# Patient Record
Sex: Female | Born: 1970 | Race: White | Hispanic: No | Marital: Married | Smoking: Never smoker
Health system: Southern US, Community
[De-identification: ages and names within clinical notes are randomized; demographics above are authoritative.]

## PROBLEM LIST (undated history)

## (undated) DIAGNOSIS — N870 Mild cervical dysplasia: Secondary | ICD-10-CM

## (undated) DIAGNOSIS — K219 Gastro-esophageal reflux disease without esophagitis: Secondary | ICD-10-CM

## (undated) DIAGNOSIS — R51 Headache: Secondary | ICD-10-CM

## (undated) DIAGNOSIS — E559 Vitamin D deficiency, unspecified: Secondary | ICD-10-CM

## (undated) DIAGNOSIS — R519 Headache, unspecified: Secondary | ICD-10-CM

## (undated) DIAGNOSIS — T7840XA Allergy, unspecified, initial encounter: Secondary | ICD-10-CM

## (undated) DIAGNOSIS — J45909 Unspecified asthma, uncomplicated: Secondary | ICD-10-CM

## (undated) DIAGNOSIS — N1370 Vesicoureteral-reflux, unspecified: Secondary | ICD-10-CM

## (undated) DIAGNOSIS — J3089 Other allergic rhinitis: Secondary | ICD-10-CM

## (undated) DIAGNOSIS — A6009 Herpesviral infection of other urogenital tract: Secondary | ICD-10-CM

## (undated) HISTORY — PX: DILATION AND CURETTAGE OF UTERUS: SHX78

## (undated) HISTORY — PX: ESOPHAGOGASTRODUODENOSCOPY: SHX1529

## (undated) HISTORY — PX: SPINE SURGERY: SHX786

## (undated) HISTORY — DX: Vitamin D deficiency, unspecified: E55.9

## (undated) HISTORY — DX: Vesicoureteral-reflux, unspecified: N13.70

## (undated) HISTORY — DX: Allergy, unspecified, initial encounter: T78.40XA

## (undated) HISTORY — PX: EXTERNAL EAR SURGERY: SHX627

## (undated) HISTORY — PX: CERVICAL SPINE SURGERY: SHX589

## (undated) HISTORY — DX: Mild cervical dysplasia: N87.0

## (undated) HISTORY — PX: DILATATION & CURETTAGE/HYSTEROSCOPY WITH TRUECLEAR: SHX6353

## (undated) HISTORY — PX: MANDIBLE SURGERY: SHX707

---
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
## 1999-01-22 HISTORY — PX: BACK SURGERY: SHX140

## 1999-02-14 ENCOUNTER — Other Ambulatory Visit: Admission: RE | Admit: 1999-02-14 | Discharge: 1999-02-14 | Payer: Self-pay | Admitting: Internal Medicine

## 1999-04-23 ENCOUNTER — Ambulatory Visit (HOSPITAL_COMMUNITY): Admission: RE | Admit: 1999-04-23 | Discharge: 1999-04-24 | Payer: Self-pay | Admitting: *Deleted

## 1999-04-23 ENCOUNTER — Encounter: Payer: Self-pay | Admitting: *Deleted

## 1999-08-02 ENCOUNTER — Ambulatory Visit (HOSPITAL_COMMUNITY): Admission: RE | Admit: 1999-08-02 | Discharge: 1999-08-02 | Payer: Self-pay

## 1999-08-02 ENCOUNTER — Encounter: Payer: Self-pay | Admitting: *Deleted

## 2000-10-27 ENCOUNTER — Emergency Department (HOSPITAL_COMMUNITY): Admission: EM | Admit: 2000-10-27 | Discharge: 2000-10-28 | Payer: Self-pay | Admitting: Emergency Medicine

## 2000-10-28 ENCOUNTER — Encounter: Payer: Self-pay | Admitting: Emergency Medicine

## 2003-04-07 ENCOUNTER — Other Ambulatory Visit: Admission: RE | Admit: 2003-04-07 | Discharge: 2003-04-07 | Payer: Self-pay | Admitting: Family Medicine

## 2010-01-03 ENCOUNTER — Other Ambulatory Visit
Admission: RE | Admit: 2010-01-03 | Discharge: 2010-01-03 | Payer: Self-pay | Source: Home / Self Care | Admitting: Registered Nurse

## 2010-02-10 ENCOUNTER — Encounter: Payer: Self-pay | Admitting: Emergency Medicine

## 2010-06-08 NOTE — Op Note (Signed)
[place]  Patient:    Anna Torres, Anna Torres                         MRN: [id number] Proc. Date: 04/23/99 Adm. 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  OPERATIONS: 1. C4/5 anterior discectomy, microsurgical. 2. Fusion with 8 mm custom tailored allograft fibular strut and DBX bone paste. 3. Fixation with 24 mm Codman titanium plate and 12 mm by 3.5 mm screws x 4.  SURGEON:  Ricky D. Gasper Sells, M.D.  ASSISTANT:  Jearld Adjutant, M.D.  COMPLICATIONS:  Nil.  SPONGE/NEEDLE/INSTRUMENT COUNTS:  Correct.  INDICATION:  This is a 40 year old female that presented with marked weakness of the deltoid muscle on the left and the external rotators of the shoulder on the  left and diffuse shoulder weakness and pain.  In addition, she had intermittent  numbness and tingling of the hand and a droopy shoulder.  This represents an urgent situation since she loses innervation to her shoulder muscles.  She could develop shoulder-hand syndrome and basically an almost useless arm.  She was taken to the operating room where an uncomplicated approach was made to the obviously ruptured C4/5 disc.  This was confirmed by x-ray.  At the time of closure, the C5 nerve roots were cleared bilaterally.  The only problem with the operation is that it was difficult to get purchase in the upper screws that is n the C4 vertebral body screws.  Other than that the case is unremarkable.  DESCRIPTION OF PROCEDURE:  With the patient in the supine position with the neck slightly extended, the patient was prepped and draped in the usual fashion for n anterior approach to the cervical spine  on the right.  We investigated her neck  preincision to see where her skin fold lines were and they were more prominent n the right than the left, so that is why I approached from that side.  It also makes it slightly easier to see the left C5 nerve root.  All pressure points were carefully inspected and padded and then the patient was prepped and draped in the usual fashion with PAS hose in place and a bear hugger blanket in place.  A 2.5 cm incision was made in the right anterior triangle and the convenient skin folds.  Subcutaneous dissection was carried out with Metzenbaum scissors the platysma was split along its fibers and retracted medially and laterally with a  total of four fishhooks.  Dissection was carried out down to the level of the carotid artery and then a left hand turn was made to expose the anterior cervical spine.  Two intraoperative x-rays were used to confirm the C4/5 level as the level to be operated on.  Caspar pins were inserted into the vertebral bodies of C4 and C5 bilaterally and some traction was applied and then appropriate Caspar retractor blades were inserted in the incision.  This exposed the C4/5 level nicely.  An 11 blade knife was then used to incise the annulus at C4/5 anteriorly and a discectomy was performed.  The cartilaginous endplates were ground off C4 and  C5.  Large dimension anterior and smaller dimension posterior.  The posterior longitudinal ligament and joint capsule was identified and removed bilaterally into the foramina with 2 mm Kerrison punch.  A moderate amount of bleeding was found at the left C5 foramen and this was packed with Gelfoam and hen finally coagulated with bipolar coagulation.  At the time of closure, the C5 foramina was nicely patent bilaterally.  A tapered bone plug was then pounded into the C4/5 interspace, small dimension posterior and large dimension anterior, and then the plug was held in place with  a 24 mm Codman titanium plate which was placed over this level having removed the  Caspar pins and secured in place with four 12 mm by 3.5 mm screws. Intraoperative control x-ray confirmed the appropriate level was done.  The screws were secured in place by locking.  Self-retaining retractors were removed and the wound was irrigated with hydrogen peroxide and then closed with  three interrupted 2-0 Vicryl in the platysmal layer and four inverted interrupted 4-0 Vicryls in the skin with benzoin and Steri-Strips in the skin.  The patient tolerated the procedure well.  No intraoperative complications occurred.  A dry dressing was applied. DD:  04/23/99 TD:  04/23/99 Job: [id number]

## 2010-06-08 NOTE — Discharge Summary (Signed)
[place]  Patient:    Anna Torres, Anna Torres                         MRN: [id number] Adm. Date:  [id number] Disc. Date: 04/24/99 Attending:  Evonnie Dawes CC:         Duncan Dull, M.D.                           Discharge Summary  ADMISSION DIAGNOSIS:  Herniated nucleus pulposus C4-5 with clear-cut left L5 radiculopathy, with weakness of the deltoid muscle and external rotators of the shoulder on the left side, and shoulder pain with numbness and tingling in the hand, and a droopy shoulder on the left.  POSTOPERATIVE DIAGNOSIS:  Herniated nucleus pulposus C4-5 with clear-cut left L5 radiculopathy, with weakness of the deltoid muscle and external rotators of the shoulder on the left side, and shoulder pain with numbness and tingling in the hand, and a droopy shoulder on the left.  OPERATIONS: 1. Anterior C4-5 microsurgical diskectomy. 2. Placement of allograft plug, large dimension anterior, small dimension    posterior, truncated, filled with DBX bone gel. 3. Fixation with 24 mm Codman titanium plate and 3.5 x 12 mm screws x 4.  SURGEON:  Ricky D. Gasper Sells, M.D.  ASSISTANT:  Jearld Adjutant, M.D.  ANESTHESIA:  General controlled.  INSTRUMENT COUNT, SPONGE COUNT, NEEDLE COUNT:  Correct.  HISTORY OF PRESENT ILLNESS AND HOSPITAL COURSE:  This is a 40 year old female on whom I performed an operation, really, on her sister 1-1/2 years ago.  For that reason, she came to see me.  When I saw her, she came in with an MRI which was positive at C4-5 eccentric to the left.  She had a clear-cut C5 radiculopathy.  She was beginning to lose strength in her shoulder, particularly the deltoids on the left and in the external rotators, and her left shoulder was somewhat droopy.  With the shoulder pain, she was experiencing hand numbness and tingling, probably a shoulder-hand phenomenon. This represents an urgent situation since, if she lost much more  strength in her shoulder, it would become virtually a useless arm.  For that reason, I booked her for an urgent decompression.  This was carried out on April 23, 1999, without much difficulty.  There was some trouble getting the plate oriented properly because the exposure was so high and her chin was recessed, and there were, basically, some anatomical difficulties getting to the C4-5 level, but these were overcome.  Immediately following surgery her left arm pain was better.  Her strength in her deltoids and her external rotators had improved.  The shoulder patient and the hand numbness and tingling were gone the next day.  She had some discomfort in the right side of her neck where I did the exposure.  Her voice was intact, she was tolerating oral feeds, and voiding normally.  Her Vistaril were stable, her temperature was normal.  She was discharged home.  CONDITION ON DISCHARGE:  Markedly improved.  DISCHARGE MEDICATIONS:  [employer] and [employer] p.o. q.4h. p.r.n.  DISCHARGE INSTRUCTIONS:  Call me p.r.n. should any problems develop.  LABORATORY DATA:  Her postoperative films looked fine.  DISCHARGE DIAGNOSIS:  Status post C4-5 anterior diskectomy and fusion with Codman titanium plate. DD:  04/24/99 TD:  04/24/99 Job: [id number]

## 2012-03-23 ENCOUNTER — Ambulatory Visit (INDEPENDENT_AMBULATORY_CARE_PROVIDER_SITE_OTHER): Payer: BC Managed Care – PPO | Admitting: Gynecology

## 2012-03-23 ENCOUNTER — Encounter: Payer: Self-pay | Admitting: Gynecology

## 2012-03-23 VITALS — Ht 62.0 in | Wt 115.0 lb

## 2012-03-23 DIAGNOSIS — N870 Mild cervical dysplasia: Secondary | ICD-10-CM

## 2012-03-23 DIAGNOSIS — N920 Excessive and frequent menstruation with regular cycle: Secondary | ICD-10-CM

## 2012-03-23 DIAGNOSIS — N8300 Follicular cyst of ovary, unspecified side: Secondary | ICD-10-CM

## 2012-03-23 LAB — TSH: TSH: 1.32 u[IU]/mL (ref 0.350–4.500)

## 2012-03-23 LAB — PROLACTIN: Prolactin: 9.5 ng/mL

## 2012-03-23 MED ORDER — MEGESTROL ACETATE 40 MG PO TABS: 40.0000 mg | ORAL_TABLET | Freq: Two times a day (BID) | ORAL | Status: DC

## 2012-03-23 NOTE — Progress Notes (Signed)
Patient is a 42 year old gravida 0 para 0 that was referred to our practice as a courtesy of Moshe Salisbury at [employer] as a result of patient's menorrhagia. Patient is using condoms for contraception. Patient had been on season neck and we drawn every 3 months. This cycle start of the week earlier and begin bleeding heavy with passage of large clots. Patient states that she has had good compliance. She had a normal medical exam on January 20 by her primary physician. She had a urine pregnancy test at her office today was negative. Patient denied any nausea vomiting or any breast tenderness. Patient stated she had a normal Pap smear in 2012 but many years ago in [facility] she had a LEEP cervical conization for CIN-1.  Exam: Abdomen: Soft nontender no rebound or guarding Pelvic: Bartholin urethra Skene was within normal limits Vagina: Blood with clots were present. Cervix: Same as above Uterus somewhat limited due to patient's vaginismus and uncomfortably Adnexa: As above Rectal exam: Not done  Assessment/plan: Early breakthrough bleeding on continuous oral contraceptive pill. Patient will be asked to discontinue the oral contraceptive pill today. She'll be placed on Megace 40 mg to take 1 by mouth twice a day for the next 10 days. She should use condoms in the meantime. She will return to the office next weekfor a sonohysterogram to rule out endometrial polyp or submucous myoma. Today patient underwent an endometrial biopsy although somewhat limited because of her discomfort level with a speculum. The cervix had been cleansed with Betadine solution and a Pipelle had been introduced sterilely and tissue submitted for histological evaluation. She will have a TSH, prolactin and a qualitative beta hCG drawn today as well.

## 2012-03-23 NOTE — Patient Instructions (Addendum)
Endometrial Biopsy This is a test in which a tissue sample (a biopsy) is taken from inside the uterus (womb). It is then looked at by a specialist under a microscope to see if the tissue is normal or abnormal. The endometrium is the lining of the uterus. This test helps determine where you are in your menstrual cycle and how hormone levels are affecting the lining of the uterus. Another use for this test is to diagnose endometrial cancer, tuberculosis, polyps, or inflammatory conditions and to evaluate uterine bleeding. PREPARATION FOR TEST No preparation or fasting is necessary. NORMAL FINDINGS No pathologic conditions. Presence of "secretory-type" endometrium 3 to 5 days before to normal menstruation. Ranges for normal findings may vary among different laboratories and hospitals. You should always check with your doctor after having lab work or other tests done to discuss the meaning of your test results and whether your values are considered within normal limits. MEANING OF TEST  Your caregiver will go over the test results with you and discuss the importance and meaning of your results, as well as treatment options and the need for additional tests if necessary. OBTAINING THE TEST RESULTS It is your responsibility to obtain your test results. Ask the lab or department performing the test when and how you will get your results. Document Released: 05/10/2004 Document Revised: 04/01/2011 Document Reviewed: 12/18/2007 ExitCare Patient Information 2013 [employer].  Transvaginal Ultrasound Transvaginal ultrasound is a pelvic ultrasound, using a metal probe that is placed in the vagina, to look at a women's female organs. Transvaginal ultrasound is a method of seeing inside the pelvis of a woman. The ultrasound machine sends out sound waves from the transducer (probe). These sound waves bounce off body structures (like an echo) to create a picture. The picture shows up on a monitor. It is called  transvaginal because the probe is inserted into the vagina. There should be very little discomfort from the vaginal probe. This test can also be used during pregnancy. Endovaginal ultrasound is another name for a transvaginal ultrasound. In a transabdominal ultrasound, the probe is placed on the outside of the belly. This method gives pictures that are lower quality than pictures from the transvaginal technique. Transvaginal ultrasound is used to look for problems of the female genital tract. Some such problems include:  Infertility problems.  Congenital (birth defect) malformations of the uterus and ovaries.  Tumors in the uterus.  Abnormal bleeding.  Ovarian tumors and cysts.  Abscess (inflamed tissue around pus) in the pelvis.  Unexplained abdominal or pelvic pain.  Pelvic infection. DURING PREGNANCY, TRANSVAGINAL ULTRASOUND MAY BE USED TO LOOK AT:  Normal pregnancy.  Ectopic pregnancy (pregnancy outside the uterus).  Fetal heartbeat.  Abnormalities in the pelvis, that are not seen well with transabdominal ultrasound.  Suspected twins or multiples.  Impending miscarriage.  Problems with the cervix (incompetent cervix, not able to stay closed and hold the baby).  When doing an amniocentesis (removing fluid from the pregnancy sac, for testing).  Looking for abnormalities of the baby.  Checking the growth, development, and age of the fetus.  Measuring the amount of fluid in the amniotic sac.  When doing an external version of the baby (moving baby into correct position).  Evaluating the baby for problems in high risk pregnancies (biophysical profile).  Suspected fetal demise (death). Sometimes a special ultrasound method called Saline Infusion Sonography (SIS) is used for a more accurate look at the uterus. Sterile saline (salt water) is injected into the uterus of   non-pregnant patients to see the inside of the uterus better. SIS is not used on pregnant women. The  vaginal probe can also assist in obtaining biopsies of abnormal areas, in draining fluid from cysts on the ovary, and in finding IUDs (intrauterine device, birth control) that cannot be located. PREPARATION FOR TEST A transvaginal ultrasound is done with the bladder empty. The transabdominal ultrasound is done with your bladder full. You may be asked to drink several glasses of water before that exam. Sometimes, a transabdominal ultrasound is done just after a transvaginal ultrasound, to look at organs in your abdomen. PROCEDURE  You will lie down on a table, with your knees bent and your feet in foot holders. The probe is covered with a condom. A sterile lubricant is put into the vagina and on the probe. The lubricant helps transmit the sound waves and avoid irritating the vagina. Your caregiver will move the probe inside the vaginal cavity to scan the pelvic structures. A normal test will show a normal pelvis and normal contents. An abnormal test will show abnormalities of the pelvis, placenta, or baby. ABNORMAL RESULTS MAY BE DUE TO:  Growths or tumors in the:  Uterus.  Ovaries.  Vagina.  Other pelvic structures.  Non-cancerous growths of the uterus and ovaries.  Twisting of the ovary, cutting off blood supply to the ovary (ovarian torsion).  Areas of infection, including:  Pelvic inflammatory disease.  Abscess in the pelvis.  Locating an IUD. PROBLEMS FOUND IN PREGNANT WOMEN MAY INCLUDE:  Ectopic pregnancy (pregnancy outside the uterus).  Multiple pregnancies.  Early dilation (opening) of the cervix. This may indicate an incompetent cervix and early delivery.  Impending miscarriage.  Fetal death.  Problems with the placenta, including:  Placenta has grown over the opening of the womb (placenta previa).  Placenta has separated early in the womb (placental abruption).  Placenta grows into the muscle of the uterus (placenta accreta).  Tumors of pregnancy, including  gestational trophoblastic disease. This is an abnormal pregnancy, with no fetus. The uterus is filled with many grape-like cysts that could sometimes be cancerous.  Incorrect position of the fetus (breech, vertex).  Intrauterine fetal growth retardation (IUGR) (poor growth in the womb).  Fetal abnormalities or infection. RISKS AND COMPLICATIONS There are no known risks to the ultrasound procedure. There is no X-ray used when doing an ultrasound. Document Released: 12/20/2003 Document Revised: 04/01/2011 Document Reviewed: 12/07/2008 ExitCare Patient Information 2013 [employer].   

## 2012-03-30 ENCOUNTER — Telehealth: Payer: Self-pay | Admitting: *Deleted

## 2012-03-30 ENCOUNTER — Ambulatory Visit (INDEPENDENT_AMBULATORY_CARE_PROVIDER_SITE_OTHER): Payer: BC Managed Care – PPO

## 2012-03-30 ENCOUNTER — Ambulatory Visit (INDEPENDENT_AMBULATORY_CARE_PROVIDER_SITE_OTHER): Payer: BC Managed Care – PPO | Admitting: Gynecology

## 2012-03-30 ENCOUNTER — Other Ambulatory Visit: Payer: Self-pay | Admitting: Gynecology

## 2012-03-30 DIAGNOSIS — N840 Polyp of corpus uteri: Secondary | ICD-10-CM

## 2012-03-30 DIAGNOSIS — N8300 Follicular cyst of ovary, unspecified side: Secondary | ICD-10-CM

## 2012-03-30 DIAGNOSIS — N920 Excessive and frequent menstruation with regular cycle: Secondary | ICD-10-CM

## 2012-03-30 MED ORDER — MEGESTROL ACETATE 40 MG PO TABS: 40.0000 mg | ORAL_TABLET | Freq: Two times a day (BID) | ORAL | Status: DC

## 2012-03-30 NOTE — Telephone Encounter (Signed)
Message copied by Aura Camps on Mon Mar 30, 2012  3:39 PM ------      Message from: Leanor Kail      Created: Mon Mar 30, 2012  1:05 PM       Victorino Dike,            Patient had [facility] this am.  States that Dr. Glenetta Hew mentioned that she may need to stay on Megace until surgery.  She only has 3 days left of meds so if he intended her to stay on to send rx to [facility].            Thanks ------

## 2012-03-30 NOTE — Progress Notes (Signed)
Patient 42 year old gravida 0 para 0 who presented to the office today for followup sonohysterogram as a result of her dysfunctional uterine bleeding. See previous note from last office visit 03/23/2012 for details. Patient was asked to stop her seasonique oral contraceptive pill. She was placed on Megace 40 mg twice a day for 10 days to cut down on her bleeding and passage of large blood clots which she was complaining of. We did do an endometrial biopsy last office visit with the following results:  Endometrium, biopsy - ATROPHIC APPEARING ENDOMETRIUM. - THERE IS NO EVIDENCE OF HYPERPLASIA OR MALIGNANCY.  Patient underwent a sonohysterogram as Follows:  The initial transvaginal ultrasound demonstrated the following:  Uterus measures 7.8 x 4.9 x 3.7 mm with endometrial stripe of 3.6 mm right and left ovary were normal with several follicles.   The speculum  was inserted and the cervix cleansed with Betadine solution after confirming that patient has no allergies.A small sonohysterography catheterwas utilized.  Insertion was facilitated with ring forceps, using a spear-like motion the catheter was inserted to the fundus of the uterus. The speculum is then removed carefully to avoid dislodging the catheter. The catheter was flushed with sterile saline delete prior to insertion to rid it of small amounts of air.the sterile saline solution was infused into the uterine cavity as a vaginal ultrasound probe was then placed in the vagina for full visualization of the uterine cavity from a transvaginal approach. The following was noted:  Echogenic anterior defect measuring 16 x 4 x 12 mm was seen in the endometrial cavity  The catheter was then removed after retrieving some of the saline from the intrauterine cavity.  She had received a tablet of Aleve for discomfort.   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 Patient will be scheduled for resectoscopic polypectomy  the next few weeks. She'll be The Megace 40 Mg Twice a Day until the Time of Her Surgery. She Will Use Barrier Contraception during That Time. Literature Information on Resectoscopic Polypectomy Was Provided. We Will See the Patient A Few Days before Her Surgery for Her Preop Examination.

## 2012-03-30 NOTE — Telephone Encounter (Signed)
Per JF office note pt will continue megace 40 mg twice daily until surgery. I sent 1 extra refill for pt. Pt informed as well.

## 2012-04-01 ENCOUNTER — Telehealth: Payer: Self-pay

## 2012-04-01 ENCOUNTER — Other Ambulatory Visit: Payer: Self-pay | Admitting: Gynecology

## 2012-04-01 ENCOUNTER — Other Ambulatory Visit: Payer: BC Managed Care – PPO

## 2012-04-01 ENCOUNTER — Ambulatory Visit: Payer: BC Managed Care – PPO | Admitting: Gynecology

## 2012-04-01 MED ORDER — MEGESTROL ACETATE 40 MG PO TABS: 40.0000 mg | ORAL_TABLET | Freq: Two times a day (BID) | ORAL | Status: DC

## 2012-04-01 NOTE — Telephone Encounter (Signed)
I called patient to schedule surgery.  She stated she needed surgery at 1:00pm in order to have someone to drive her and be with her.  I scheduled her for Friday, March 28 1:00pm for Resectoscopic Polypectomy with TruClear Morcellator and I arranged for rep to be there for equipment.  Patient was informed that Dr. Glenetta Hew wants her to continue on the Megace BID until surgery and that I had called her in a refill on that Rx.  Pre-op consult was set for 04/07/12. We reviewed estimated financial responsibility and insurance benefits. Financial letter was mailed to patient detailing this.  She will expect to hear from [facility] for pre-op and financial arrangements.

## 2012-04-07 ENCOUNTER — Encounter: Payer: Self-pay | Admitting: Gynecology

## 2012-04-07 ENCOUNTER — Encounter (HOSPITAL_COMMUNITY): Payer: Self-pay | Admitting: Pharmacist

## 2012-04-07 ENCOUNTER — Ambulatory Visit (INDEPENDENT_AMBULATORY_CARE_PROVIDER_SITE_OTHER): Payer: BC Managed Care – PPO | Admitting: Gynecology

## 2012-04-07 VITALS — BP 116/74

## 2012-04-07 DIAGNOSIS — Z01818 Encounter for other preprocedural examination: Secondary | ICD-10-CM

## 2012-04-07 DIAGNOSIS — N840 Polyp of corpus uteri: Secondary | ICD-10-CM

## 2012-04-07 DIAGNOSIS — N938 Other specified abnormal uterine and vaginal bleeding: Secondary | ICD-10-CM

## 2012-04-07 MED ORDER — METOCLOPRAMIDE HCL 10 MG PO TABS: 10.0000 mg | ORAL_TABLET | Freq: Three times a day (TID) | ORAL | Status: DC

## 2012-04-07 MED ORDER — LEVONORGEST-ETH ESTRAD 91-DAY 0.15-0.03 &0.01 MG PO TABS: 1.0000 | ORAL_TABLET | Freq: Every day | ORAL | Status: DC

## 2012-04-07 NOTE — Progress Notes (Signed)
Anna Torres is an 42 y.o. female. Who presented to the office today for preoperative consultation as a result of her dysfunctional uterine bleeding and endometrial polyp. Patient was seen in the office on 03/30/2012 and had previously been as to discontinue taking the seasonique oral contraceptive pill and was given Megace 40 mg twice a day to stop her bleeding during her evaluation.  The initial transvaginal ultrasound demonstrated the following:  Uterus measures 7.8 x 4.9 x 3.7 mm with endometrial stripe of 3.6 mm right and left ovary were normal with several follicles. Her sonohysterogram demonstrated Echogenic anterior defect measuring 16 x 4 x 12 mm was seen in the endometrial cavity.  Her recent endometrial biopsy demonstrated the following:  ATROPHIC APPEARING ENDOMETRIUM.  - THERE IS NO EVIDENCE OF HYPERPLASIA OR MALIGNANCY.    Pertinent Gynecological History: Menses: Menorrhagia Bleeding: intermenstrual bleeding Contraception: OCP (estrogen/progesterone) DES exposure: denies Blood transfusions: none Sexually transmitted diseases: no past history Previous GYN Procedures: None  Last mammogram: No prior study Date: no prior study Last pap: normal Date: 2013? OB History: G0, P0   Menstrual History: Menarche age: 13  Patient's last menstrual period was 03/12/2012.    Past Medical History  Diagnosis Date  . CIN I (cervical intraepithelial neoplasia I)   . Vitamin D deficiency   . Ureteral reflux 1975    Past Surgical History  Procedure Laterality Date  . Back surgery Bilateral 2001    C4-C5 DISK   . Mandible fracture surgery  1990    Family History  Problem Relation Age of Onset  . Diabetes Mother   . Rheum arthritis Mother     Social History:  reports that she has never smoked. She has never used smokeless tobacco. She reports that she does not drink alcohol or use illicit drugs.  Allergies:  Allergies  Allergen Reactions  . Hydrocodone Nausea And Vomiting      (Not in a hospital admission)  REVIEW OF SYSTEMS: A ROS was performed and pertinent positives and negatives are included in the history.  GENERAL: No fevers or chills. HEENT: No change in vision, no earache, sore throat or sinus congestion. NECK: No pain or stiffness. CARDIOVASCULAR: No chest pain or pressure. No palpitations. PULMONARY: No shortness of breath, cough or wheeze. GASTROINTESTINAL: No abdominal pain, nausea, vomiting or diarrhea, melena or bright red blood per rectum. GENITOURINARY: No urinary frequency, urgency, hesitancy or dysuria. MUSCULOSKELETAL: No joint or muscle pain, no back pain, no recent trauma. DERMATOLOGIC: No rash, no itching, no lesions. ENDOCRINE: No polyuria, polydipsia, no heat or cold intolerance. No recent change in weight. HEMATOLOGICAL: No anemia or easy bruising or bleeding. NEUROLOGIC: No headache, seizures, numbness, tingling or weakness. PSYCHIATRIC: No depression, no loss of interest in normal activity or change in sleep pattern.     Blood pressure 116/74, last menstrual period 03/12/2012.  Physical Exam:  HEENT:unremarkable Neck:Supple, midline, no thyroid megaly, no carotid bruits Lungs:  Clear to auscultation no rhonchi's or wheezes Heart:Regular rate and rhythm, no murmurs or gallops Breast Exam:not examined Abdomen:soft nontender no rebound guarding Pelvic:BUSwithin normal limits Vagina:no lesions or discharge Cervix:no lesions or discharge Uterus:anteverted normal size shape and consistency Adnexa:no masses or tenderness Extremities: No cords, no edema Rectal:not examined   Assessment/Plan: Patient with dysfunction uterine bleeding attributed to endometrial polyp. Patient scheduled to undergo resectoscopic polypectomy with a true clear morcellator in outpatient setting. Literature information was provided on the procedure. The risks benefits and pros and cons as discussed below were explained to   the patient:                         Patient was counseled as to the risk of surgery to include the following:  1. Infection (prohylactic antibiotics will be administered)  2. DVT/Pulmonary Embolism (prophylactic pneumo compression stockings will be used)  3.Trauma to internal organs requiring additional surgical procedure to repair any injury to     Internal organs requiring perhaps additional hospitalization days.  4.Hemmorhage requiring transfusion and blood products which carry risks such as anaphylactic reaction, hepatitis and AIDS  Patient had received literature information on the procedure scheduled and all her questions were answered and fully accepts all risk.  Janelle Culton HMD3:40 PMTD@  Jisell Majer H 04/07/2012, 3:32 PM  

## 2012-04-07 NOTE — Patient Instructions (Addendum)
Hysteroscopy  Hysteroscopy is a procedure used for looking inside the womb (uterus). It may be done for many different reasons, including:  · To evaluate abnormal bleeding, fibroid (benign, noncancerous) tumors, polyps, scar tissue (adhesions), and possibly cancer of the uterus.  · To look for lumps (tumors) and other uterine growths.  · To look for causes of why a woman cannot get pregnant (infertility), causes of recurrent loss of pregnancy (miscarriages), or a lost intrauterine device (IUD).  · To perform a sterilization by blocking the fallopian tubes from inside the uterus.  A hysteroscopy should be done right after a menstrual period to be sure you are not pregnant.  LET YOUR CAREGIVER KNOW ABOUT:   · Allergies.  · Medicines taken, including herbs, eyedrops, over-the-counter medicines, and creams.  · Use of steroids (by mouth or creams).  · Previous problems with anesthetics or numbing medicines.  · History of bleeding or blood problems.  · History of blood clots.  · Possibility of pregnancy, if this applies.  · Previous surgery.  · Other health problems.  RISKS AND COMPLICATIONS   · Putting a hole in the uterus.  · Excessive bleeding.  · Infection.  · Damage to the cervix.  · Injury to other organs.  · Allergic reaction to medicines.  · Too much fluid used in the uterus for the procedure.  BEFORE THE PROCEDURE   · Do not take aspirin or blood thinners for a week before the procedure, or as directed. It can cause bleeding.  · Arrive at least 60 minutes before the procedure or as directed to read and sign the necessary forms.  · Arrange for someone to take you home after the procedure.  · If you smoke, do not smoke for 2 weeks before the procedure.  PROCEDURE   · Your caregiver may give you medicine to relax you. He or she may also give you a medicine that numbs the area around the cervix (local anesthetic) or a medicine that makes you sleep (general anesthesia).  · Sometimes, a medicine is placed in the cervix  the day before the procedure. This medicine makes the cervix have a larger opening (dilate). This makes it easier for the instrument to be inserted into the uterus.  · A small instrument (hysteroscope) is inserted through the vagina into the uterus. This instrument is similar to a pencil-sized telescope with a light.  · During the procedure, air or a liquid is put into the uterus, which allows the surgeon to see better.  · Sometimes, tissue is gently scraped from inside the uterus. These tissue samples are sent to a specialist who looks at tissue samples (pathologist). The pathologist will give a report to your caregiver. This will help your caregiver decide if further treatment is necessary. The report will also help your caregiver decide on the best treatment if the test comes back abnormal.  AFTER THE PROCEDURE   · If you had a general anesthetic, you may be groggy for a couple hours after the procedure.  · If you had a local anesthetic, you will be advised to rest at the surgical center or caregiver's office until you are stable and feel ready to go home.  · You may have some cramping for a couple days.  · You may have bleeding, which varies from light spotting for a few days to menstrual-like bleeding for up to 3 to 7 days. This is normal.  · Have someone take you home.  FINDING OUT THE   RESULTS OF YOUR TEST  Not all test results are available during your visit. If your test results are not back during the visit, make an appointment with your caregiver to find out the results. Do not assume everything is normal if you have not heard from your caregiver or the medical facility. It is important for you to follow up on all of your test results.  HOME CARE INSTRUCTIONS   · Do not drive for 24 hours or as instructed.  · Only take over-the-counter or prescription medicines for pain, discomfort, or fever as directed by your caregiver.  · Do not take aspirin. It can cause or aggravate bleeding.  · Do not drive or drink  alcohol while taking pain medicine.  · You may resume your usual diet.  · Do not use tampons, douche, or have sexual intercourse for 2 weeks, or as advised by your caregiver.  · Rest and sleep for the first 24 to 48 hours.  · Take your temperature twice a day for 4 to 5 days. Write it down. Give these temperatures to your caregiver if they are abnormal (above 98.6° F or 37.0° C).  · Take medicines your caregiver has ordered as directed.  · Follow your caregiver's advice regarding diet, exercise, lifting, driving, and general activities.  · Take showers instead of baths for 2 weeks, or as recommended by your caregiver.  · If you develop constipation:  · Take a mild laxative with the advice of your caregiver.  · Eat bran foods.  · Drink enough water and fluids to keep your urine clear or pale yellow.  · Try to have someone with you or available to you for the first 24 to 48 hours, especially if you had a general anesthetic.  · Make sure you and your family understand everything about your operation and recovery.  · Follow your caregiver's advice regarding follow-up appointments and Pap smears.  SEEK MEDICAL CARE IF:   · You feel dizzy or lightheaded.  · You feel sick to your stomach (nauseous).  · You develop abnormal vaginal discharge.  · You develop a rash.  · You have an abnormal reaction or allergy to your medicine.  · You need stronger pain medicine.  SEEK IMMEDIATE MEDICAL CARE IF:   · Bleeding is heavier than a normal menstrual period or you have blood clots.  · You have an oral temperature above 102° F (38.9° C), not controlled by medicine.  · You have increasing cramps or pains not relieved with medicine.  · You develop belly (abdominal) pain that does not seem to be related to the same area of earlier cramping and pain.  · You pass out.  · You develop pain in the tops of your shoulders (shoulder strap areas).  · You develop shortness of breath.  MAKE SURE YOU:   · Understand these instructions.  · Will watch  your condition.  · Will get help right away if you are not doing well or get worse.  Document Released: 04/15/2000 Document Revised: 04/01/2011 Document Reviewed: 08/08/2008  [employer]® Patient Information ©2013 [employer].

## 2012-04-16 MED ORDER — DEXTROSE 5 % IV SOLN
2.0000 g | INTRAVENOUS | Status: AC
  Administered 2012-04-17: 2 g via INTRAVENOUS
  Filled 2012-04-16: qty 2

## 2012-04-17 ENCOUNTER — Ambulatory Visit (HOSPITAL_COMMUNITY)
Admission: RE | Admit: 2012-04-17 | Discharge: 2012-04-17 | Disposition: A | Discharge status: Home / Self Care | Payer: BC Managed Care – PPO | Source: Ambulatory Visit | Attending: Gynecology | Admitting: Gynecology

## 2012-04-17 ENCOUNTER — Ambulatory Visit (HOSPITAL_COMMUNITY): Payer: BC Managed Care – PPO | Admitting: Anesthesiology

## 2012-04-17 ENCOUNTER — Encounter (HOSPITAL_COMMUNITY)
Admission: RE | Disposition: A | Discharge status: Home / Self Care | Payer: Self-pay | Source: Ambulatory Visit | Attending: Gynecology

## 2012-04-17 ENCOUNTER — Encounter (HOSPITAL_COMMUNITY): Payer: Self-pay | Admitting: *Deleted

## 2012-04-17 ENCOUNTER — Encounter (HOSPITAL_COMMUNITY): Payer: Self-pay | Admitting: Anesthesiology

## 2012-04-17 DIAGNOSIS — Z9889 Other specified postprocedural states: Secondary | ICD-10-CM

## 2012-04-17 DIAGNOSIS — N938 Other specified abnormal uterine and vaginal bleeding: Secondary | ICD-10-CM | POA: Insufficient documentation

## 2012-04-17 DIAGNOSIS — N840 Polyp of corpus uteri: Secondary | ICD-10-CM | POA: Insufficient documentation

## 2012-04-17 DIAGNOSIS — N949 Unspecified condition associated with female genital organs and menstrual cycle: Secondary | ICD-10-CM | POA: Insufficient documentation

## 2012-04-17 LAB — CBC
HCT: 39.3 % (ref 36.0–46.0)
Hemoglobin: 13.3 g/dL (ref 12.0–15.0)
MCH: 27.8 pg (ref 26.0–34.0)
MCHC: 33.8 g/dL (ref 30.0–36.0)
MCV: 82 fL (ref 78.0–100.0)
Platelets: 303 10*3/uL (ref 150–400)
RBC: 4.79 MIL/uL (ref 3.87–5.11)
RDW: 14 % (ref 11.5–15.5)
WBC: 8.7 10*3/uL (ref 4.0–10.5)

## 2012-04-17 LAB — URINALYSIS, ROUTINE W REFLEX MICROSCOPIC
Glucose, UA: NEGATIVE mg/dL
Protein, ur: NEGATIVE mg/dL
Specific Gravity, Urine: 1.02 (ref 1.005–1.030)

## 2012-04-17 LAB — PREGNANCY, URINE: Preg Test, Ur: NEGATIVE

## 2012-04-17 LAB — URINE MICROSCOPIC-ADD ON

## 2012-04-17 SURGERY — DILATATION & CURETTAGE/HYSTEROSCOPY WITH TRUCLEAR
Anesthesia: General | Site: Vagina | Wound class: Clean Contaminated

## 2012-04-17 MED ORDER — KETOROLAC TROMETHAMINE 30 MG/ML IJ SOLN
INTRAMUSCULAR | Status: AC
  Filled 2012-04-17: qty 1

## 2012-04-17 MED ORDER — MIDAZOLAM HCL 2 MG/2ML IJ SOLN: 0.5000 mg | Freq: Once | INTRAMUSCULAR | Status: DC | PRN

## 2012-04-17 MED ORDER — LIDOCAINE HCL (CARDIAC) 20 MG/ML IV SOLN
INTRAVENOUS | Status: DC | PRN
  Administered 2012-04-17: 60 mg via INTRAVENOUS

## 2012-04-17 MED ORDER — ONDANSETRON HCL 4 MG/2ML IJ SOLN
INTRAMUSCULAR | Status: AC
  Filled 2012-04-17: qty 2

## 2012-04-17 MED ORDER — ONDANSETRON HCL 4 MG/2ML IJ SOLN
INTRAMUSCULAR | Status: DC | PRN
  Administered 2012-04-17: 4 mg via INTRAVENOUS

## 2012-04-17 MED ORDER — KETOROLAC TROMETHAMINE 30 MG/ML IJ SOLN: 15.0000 mg | Freq: Once | INTRAMUSCULAR | Status: DC | PRN

## 2012-04-17 MED ORDER — MIDAZOLAM HCL 5 MG/5ML IJ SOLN
INTRAMUSCULAR | Status: DC | PRN
  Administered 2012-04-17: 2 mg via INTRAVENOUS

## 2012-04-17 MED ORDER — MEPERIDINE HCL 25 MG/ML IJ SOLN: 6.2500 mg | INTRAMUSCULAR | Status: DC | PRN

## 2012-04-17 MED ORDER — DEXAMETHASONE SODIUM PHOSPHATE 10 MG/ML IJ SOLN
INTRAMUSCULAR | Status: AC
  Filled 2012-04-17: qty 1

## 2012-04-17 MED ORDER — PROPOFOL 10 MG/ML IV EMUL
INTRAVENOUS | Status: AC
  Filled 2012-04-17: qty 20

## 2012-04-17 MED ORDER — PROMETHAZINE HCL 25 MG/ML IJ SOLN: 6.2500 mg | INTRAMUSCULAR | Status: DC | PRN

## 2012-04-17 MED ORDER — LIDOCAINE HCL (CARDIAC) 20 MG/ML IV SOLN
INTRAVENOUS | Status: AC
  Filled 2012-04-17: qty 5

## 2012-04-17 MED ORDER — MIDAZOLAM HCL 2 MG/2ML IJ SOLN
INTRAMUSCULAR | Status: AC
  Filled 2012-04-17: qty 2

## 2012-04-17 MED ORDER — KETOROLAC TROMETHAMINE 30 MG/ML IJ SOLN
INTRAMUSCULAR | Status: DC | PRN
  Administered 2012-04-17: 30 mg via INTRAVENOUS

## 2012-04-17 MED ORDER — LACTATED RINGERS IV SOLN
INTRAVENOUS | Status: DC
  Administered 2012-04-17: 13:00:00 via INTRAVENOUS
  Administered 2012-04-17: 50 mL/h via INTRAVENOUS
  Administered 2012-04-17: 13:00:00 via INTRAVENOUS

## 2012-04-17 MED ORDER — DEXAMETHASONE SODIUM PHOSPHATE 4 MG/ML IJ SOLN
INTRAMUSCULAR | Status: DC | PRN
  Administered 2012-04-17: 4 mg via INTRAVENOUS

## 2012-04-17 MED ORDER — SODIUM CHLORIDE 0.9 % IR SOLN
Status: DC | PRN
  Administered 2012-04-17: 3000 mL

## 2012-04-17 MED ORDER — FENTANYL CITRATE 0.05 MG/ML IJ SOLN
INTRAMUSCULAR | Status: AC
  Filled 2012-04-17: qty 2

## 2012-04-17 MED ORDER — PROPOFOL 10 MG/ML IV EMUL
INTRAVENOUS | Status: DC | PRN
  Administered 2012-04-17: 50 mg via INTRAVENOUS

## 2012-04-17 MED ORDER — FENTANYL CITRATE 0.05 MG/ML IJ SOLN: 25.0000 ug | INTRAMUSCULAR | Status: DC | PRN

## 2012-04-17 MED ORDER — SILVER NITRATE-POT NITRATE 75-25 % EX MISC
CUTANEOUS | Status: DC | PRN
  Administered 2012-04-17: 2

## 2012-04-17 MED ORDER — FENTANYL CITRATE 0.05 MG/ML IJ SOLN
INTRAMUSCULAR | Status: DC | PRN
  Administered 2012-04-17 (×2): 50 ug via INTRAVENOUS

## 2012-04-17 SURGICAL SUPPLY — 26 items
BLADE INCISOR TRUC PLUS 2.9 (ABLATOR) ×1 IMPLANT
CANISTERS HI-FLOW 3000CC (CANNISTER) ×6 IMPLANT
CATH ROBINSON RED A/P 16FR (CATHETERS) ×2 IMPLANT
CLOTH BEACON ORANGE TIMEOUT ST (SAFETY) ×2 IMPLANT
CONTAINER PREFILL 10% NBF 60ML (FORM) ×2 IMPLANT
CORD ACTIVE DISPOSABLE (ELECTRODE)
CORD ELECTRO ACTIVE DISP (ELECTRODE) IMPLANT
DRAPE HYSTEROSCOPY (DRAPE) ×2 IMPLANT
DRESSING TELFA 8X3 (GAUZE/BANDAGES/DRESSINGS) ×2 IMPLANT
ELECT REM PT RETURN 9FT ADLT (ELECTROSURGICAL)
ELECT VAPORTRODE GRVD BAR (ELECTRODE) IMPLANT
ELECTRODE REM PT RTRN 9FT ADLT (ELECTROSURGICAL) IMPLANT
GLOVE BIOGEL PI IND STRL 8 (GLOVE) ×1 IMPLANT
GLOVE BIOGEL PI INDICATOR 8 (GLOVE) ×1
GLOVE ECLIPSE 7.5 STRL STRAW (GLOVE) ×4 IMPLANT
GOWN STRL REIN XL XLG (GOWN DISPOSABLE) ×4 IMPLANT
INCISOR TRUC PLUS BLADE 2.9 (ABLATOR) ×2
KIT HYSTEROSCOPY TRUCLEAR (ABLATOR) IMPLANT
MORCELLATOR RECIP TRUCLEAR 4.0 (ABLATOR) IMPLANT
NEEDLE SPNL 22GX3.5 QUINCKE BK (NEEDLE) IMPLANT
PACK VAGINAL MINOR WOMEN LF (CUSTOM PROCEDURE TRAY) ×2 IMPLANT
PAD OB MATERNITY 4.3X12.25 (PERSONAL CARE ITEMS) ×2 IMPLANT
PAD PREP 24X48 CUFFED NSTRL (MISCELLANEOUS) ×2 IMPLANT
SYR CONTROL 10ML LL (SYRINGE) IMPLANT
TOWEL OR 17X24 6PK STRL BLUE (TOWEL DISPOSABLE) ×4 IMPLANT
WATER STERILE IRR 1000ML POUR (IV SOLUTION) ×2 IMPLANT

## 2012-04-17 NOTE — Transfer of Care (Addendum)
Immediate Anesthesia Transfer of Care Note  Patient: Anna Torres  Procedure(s) Performed: Procedure(s): DILATATION & CURETTAGE/HYSTEROSCOPY WITH TRUCLEAR (N/A)  Patient Location: PACU  Anesthesia Type:General  Level of Consciousness: awake, alert , oriented and patient cooperative  Airway & Oxygen Therapy: Patient Spontanous Breathing and Patient connected to nasal cannula oxygen  Post-op Assessment: Report given to PACU RN and Post -op Vital signs reviewed and stable  Post vital signs: Reviewed and stable  Complications: No apparent anesthesia complications

## 2012-04-17 NOTE — Op Note (Signed)
04/17/2012  1:40 PM  PATIENT:  Anna Torres  42 y.o. female  PRE-OPERATIVE DIAGNOSIS:  endometrial polyps, dysfunction uterine bleeding  POST-OPERATIVE DIAGNOSIS:  endometrial polyp, dysfunction uterine bleeding  PROCEDURE:  Procedure(s): resectoscopic polypectomy with true clear morcellator   SURGEON:  Surgeon(s): Ok Edwards, MD  ANESTHESIA:   general  FINDINGS:Patient was found to have multiple endometrial polyps scattered throughout the intrauterine cavity and lower uterine segment. Both tubal ostia were identified.  DESCRIPTION OF OPERATION:patient was taken to the operating room where she underwent successful general endotracheal anesthesia. Patient received 1 g of Cefotan IV for prophylaxis. Patient also had PAS stockings for prophylaxis as well. Time out was accomplished whereby the patient was identified as well as the planned operation. Patient's vagina and perineum were prepped and draped in usual sterile fashion. The bladder was emptied of its contents with a red rubber Robinson catheter for approximately 50 cc. Exam under anesthesia demonstrated an anteverted uterus with no palpable adnexal masses. A short weighted billed speculum was placed in the posterior vaginal vault. A Sims retractor was placed anteriorly for exposure of the cervix. A single-tooth tenaculum was placed on the anterior cervical lip. The uterus sounded to 7 cm. The cervical canal was dilated to a size 19 mm Pratt dilator. The [facility] operative resectoscope was inserted into the intrauterine cavity. Normal saline was the distending media. A systematic inspection demonstrated the above findings. Hysteroscope size was 5.0 mm. A 2.9 incisor blade was inserted through the operative element. The endometrial polyps were morcellated and specimen submitted for histological evaluation. Pre-and post resectoscopic polypectomy pictures were obtained. The single-tooth tenaculum was removed. The patient was  extubated. She was transferred to recovery room with stable vital signs. Blood loss was minimal. Patient received Toradol 30 mg IM in route to the recovery room. Distending media normal saline fluid deficit 100 cc.  ESTIMATED BLOOD LOSS:minimal   Intake/Output Summary (Last 24 hours) at 04/17/12 1340 Last data filed at 04/17/12 1328  Gross per 24 hour  Intake   1000 ml  Output     35 ml  Net    965 ml     BLOOD ADMINISTERED:none   LOCAL MEDICATIONS USED:  NONE  SPECIMEN:  Source of Specimen:  Endometrial polyps  DISPOSITION OF SPECIMEN:  PATHOLOGY  COUNTS:  YES  PLAN OF CARE: Transfer to PACU  [facility] HMD1:40 PMTD@

## 2012-04-17 NOTE — Anesthesia Preprocedure Evaluation (Addendum)
Anesthesia Evaluation  Patient identified by MRN, date of birth, ID band Patient awake    Reviewed: Allergy & Precautions, H&P , Patient's Chart, lab work & pertinent test results, reviewed documented beta blocker date and time   History of Anesthesia Complications Negative for: history of anesthetic complications  Airway Mallampati: II TM Distance: >3 FB and <3 FB Neck ROM: full    Dental no notable dental hx.    Pulmonary neg pulmonary ROS,  breath sounds clear to auscultation  Pulmonary exam normal       Cardiovascular Exercise Tolerance: Good negative cardio ROS  Rhythm:regular Rate:Normal     Neuro/Psych negative neurological ROS  negative psych ROS   GI/Hepatic negative GI ROS, Neg liver ROS,   Endo/Other  negative endocrine ROS  Renal/GU negative Renal ROS     Musculoskeletal   Abdominal   Peds  Hematology negative hematology ROS (+)   Anesthesia Other Findings CIN I (cervical intraepithelial neoplasia I)     Vitamin D deficiency        Ureteral reflux 1975    Reproductive/Obstetrics negative OB ROS                          Anesthesia Physical Anesthesia Plan  ASA: II  Anesthesia Plan: General LMA   Post-op Pain Management:    Induction:   Airway Management Planned:   Additional Equipment:   Intra-op Plan:   Post-operative Plan:   Informed Consent: I have reviewed the patients History and Physical, chart, labs and discussed the procedure including the risks, benefits and alternatives for the proposed anesthesia with the patient or authorized representative who has indicated his/her understanding and acceptance.   Dental Advisory Given  Plan Discussed with: CRNA, Surgeon and Anesthesiologist  Anesthesia Plan Comments:         Anesthesia Quick Evaluation

## 2012-04-17 NOTE — Interval H&P Note (Signed)
History and Physical Interval Note:  04/17/2012 12:44 PM  Anna Torres  has presented today for surgery, with the diagnosis of endometrial polyp  The various methods of treatment have been discussed with the patient and family. After consideration of risks, benefits and other options for treatment, the patient has consented to  Procedure(s) with comments: DILATATION & CURETTAGE/HYSTEROSCOPY WITH TRUCLEAR (N/A) - CPT [id number] as a surgical intervention .  The patient's history has been reviewed, patient examined, no change in status, stable for surgery.  I have reviewed the patient's chart and labs.  Questions were answered to the patient's satisfaction.     Ok Edwards

## 2012-04-17 NOTE — H&P (View-Only) (Signed)
Anna Torres is an 42 y.o. female. Who presented to the office today for preoperative consultation as a result of her dysfunctional uterine bleeding and endometrial polyp. Patient was seen in the office on 03/30/2012 and had previously been as to discontinue taking the seasonique oral contraceptive pill and was given Megace 40 mg twice a day to stop her bleeding during her evaluation.  The initial transvaginal ultrasound demonstrated the following:  Uterus measures 7.8 x 4.9 x 3.7 mm with endometrial stripe of 3.6 mm right and left ovary were normal with several follicles. Her sonohysterogram demonstrated Echogenic anterior defect measuring 16 x 4 x 12 mm was seen in the endometrial cavity.  Her recent endometrial biopsy demonstrated the following:  ATROPHIC APPEARING ENDOMETRIUM.  - THERE IS NO EVIDENCE OF HYPERPLASIA OR MALIGNANCY.    Pertinent Gynecological History: Menses: Menorrhagia Bleeding: intermenstrual bleeding Contraception: OCP (estrogen/progesterone) DES exposure: denies Blood transfusions: none Sexually transmitted diseases: no past history Previous GYN Procedures: None  Last mammogram: No prior study Date: no prior study Last pap: normal Date: 2013? OB History: G0, P0   Menstrual History: Menarche age: 61  Patient's last menstrual period was 03/12/2012.    Past Medical History  Diagnosis Date  . CIN I (cervical intraepithelial neoplasia I)   . Vitamin D deficiency   . Ureteral reflux 1975    Past Surgical History  Procedure Laterality Date  . Back surgery Bilateral 2001    C4-C5 DISK   . Mandible fracture surgery  1990    Family History  Problem Relation Age of Onset  . Diabetes Mother   . Rheum arthritis Mother     Social History:  reports that she has never smoked. She has never used smokeless tobacco. She reports that she does not drink alcohol or use illicit drugs.  Allergies:  Allergies  Allergen Reactions  . Hydrocodone Nausea And Vomiting      (Not in a hospital admission)  REVIEW OF SYSTEMS: A ROS was performed and pertinent positives and negatives are included in the history.  GENERAL: No fevers or chills. HEENT: No change in vision, no earache, sore throat or sinus congestion. NECK: No pain or stiffness. CARDIOVASCULAR: No chest pain or pressure. No palpitations. PULMONARY: No shortness of breath, cough or wheeze. GASTROINTESTINAL: No abdominal pain, nausea, vomiting or diarrhea, melena or bright red blood per rectum. GENITOURINARY: No urinary frequency, urgency, hesitancy or dysuria. MUSCULOSKELETAL: No joint or muscle pain, no back pain, no recent trauma. DERMATOLOGIC: No rash, no itching, no lesions. ENDOCRINE: No polyuria, polydipsia, no heat or cold intolerance. No recent change in weight. HEMATOLOGICAL: No anemia or easy bruising or bleeding. NEUROLOGIC: No headache, seizures, numbness, tingling or weakness. PSYCHIATRIC: No depression, no loss of interest in normal activity or change in sleep pattern.     Blood pressure 116/74, last menstrual period 03/12/2012.  Physical Exam:  HEENT:unremarkable Neck:Supple, midline, no thyroid megaly, no carotid bruits Lungs:  Clear to auscultation no rhonchi's or wheezes Heart:Regular rate and rhythm, no murmurs or gallops Breast Exam:not examined Abdomen:soft nontender no rebound guarding Pelvic:BUSwithin normal limits Vagina:no lesions or discharge Cervix:no lesions or discharge Uterus:anteverted normal size shape and consistency Adnexa:no masses or tenderness Extremities: No cords, no edema Rectal:not examined   Assessment/Plan: Patient with dysfunction uterine bleeding attributed to endometrial polyp. Patient scheduled to undergo resectoscopic polypectomy with a true clear morcellator in outpatient setting. Literature information was provided on the procedure. The risks benefits and pros and cons as discussed below were explained to  the patient:                         Patient was counseled as to the risk of surgery to include the following:  1. Infection (prohylactic antibiotics will be administered)  2. DVT/Pulmonary Embolism (prophylactic pneumo compression stockings will be used)  3.Trauma to internal organs requiring additional surgical procedure to repair any injury to     Internal organs requiring perhaps additional hospitalization days.  4.Hemmorhage requiring transfusion and blood products which carry risks such as anaphylactic reaction, hepatitis and AIDS  Patient had received literature information on the procedure scheduled and all her questions were answered and fully accepts all risk.  [facility] HMD3:40 PMTD@  Ok Edwards 04/07/2012, 3:32 PM

## 2012-04-17 NOTE — Anesthesia Postprocedure Evaluation (Signed)
  Anesthesia Post-op Note  Anesthesia Post Note  Patient: Anna Torres  Procedure(s) Performed: Procedure(s) (LRB): DILATATION & CURETTAGE/HYSTEROSCOPY WITH TRUCLEAR (N/A)  Anesthesia type: General  Patient location: PACU  Post pain: Pain level controlled  Post assessment: Post-op Vital signs reviewed  Last Vitals:  Filed Vitals:   04/17/12 1445  BP: 106/63  Pulse: 70  Temp:   Resp: 16    Post vital signs: Reviewed  Level of consciousness: sedated  Complications: No apparent anesthesia complications

## 2012-04-17 NOTE — Preoperative (Signed)
Beta Blockers   Reason not to administer Beta Blockers:Not Applicable 

## 2012-05-01 ENCOUNTER — Ambulatory Visit (INDEPENDENT_AMBULATORY_CARE_PROVIDER_SITE_OTHER): Payer: BC Managed Care – PPO | Admitting: Gynecology

## 2012-05-01 ENCOUNTER — Encounter: Payer: Self-pay | Admitting: Gynecology

## 2012-05-01 VITALS — BP 110/68

## 2012-05-01 DIAGNOSIS — Z09 Encounter for follow-up examination after completed treatment for conditions other than malignant neoplasm: Secondary | ICD-10-CM

## 2012-05-01 NOTE — Progress Notes (Signed)
Patient is a 42 year old who is status post resectoscopic myomectomy for/polypectomy as a result of dysfunction uterine bleeding despite being on oral contraceptive pill. Preoperatively the patient had an office sonohysterogram which demonstrated the following:  The initial transvaginal ultrasound demonstrated the following:  Uterus measures 7.8 x 4.9 x 3.7 mm with endometrial stripe of 3.6 mm right and left ovary were normal with several follicles. Her sonohysterogram demonstrated Echogenic anterior defect measuring 16 x 4 x 12 mm was seen in the endometrial cavity.   Prior to that she had an endometrial biopsy with the following result: ATROPHIC APPEARING ENDOMETRIUM.  - THERE IS NO EVIDENCE OF HYPERPLASIA OR MALIGNANCY.   On March 28 patient underwent a resectoscopic polypectomy/myomectomy and pathology report was as follows:  Diagnosis Endometrial polyp - BENIGN ENDOMETRIAL POLYP, SEE COMMENT - DETACHED FRAGMENTS OF SQUAMOUS EPITHELIUM; NEGATIVE FOR INTRAEPITHELIAL LESION OR MALIGNANCY. - FRAGMENTS OF BENIGN SMOOTH MUSCLE. - NEGATIVE FOR HYPERPLASIA OR MALIGNANCY. Microscopic Comment Multiple recut sections were examined. Tissue sections demonstrate polypoid fragments of endometrium consistent with endometrial polyp. 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 There are no features of hyperplasia or malignancy identified  Patient presented to the office for her two-week postop visit and is doing well she start her menses today. She was shown the pictures from her surgery. Pathology report was discussed.  Exam: Bartholin urethra Skene was within normal limits Vagina: Menstrual blood present Cervix: No lesions or discharge Uterus: Anteverted normal size shape and consistency Adnexa: No palpable masses or tenderness Rectal exam not done  Assessment/plan: Patient 2 weeks status post resectoscopic polypectomy which was  contributing to her dysfunctional uterine bleeding. Patient started her first day of her menses today. She will start a new pack of her oral contraceptive pill this Sunday. She will continue to followup with my colleague Dr. Juline Patch who had kindly referred her to our practice.

## 2012-07-10 ENCOUNTER — Telehealth: Payer: Self-pay | Admitting: *Deleted

## 2012-07-10 NOTE — Telephone Encounter (Signed)
Telephone call, is in the third month of active pills, will stop for 1 week have her period and then resume active pills. Spotting would continue instructed to call back.

## 2012-07-10 NOTE — Telephone Encounter (Signed)
(  JF patient) Pt is currently taking seasonique, has cycle every 3 months. Pt forgot to take pill on Sunday so she took both pills on Monday (sunday and Monday pill) and they daily afterward. She has been having spotting after doing this daily, she has skipped a pill before and taken as the above and no bleeding. 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 Please advise

## 2013-02-15 ENCOUNTER — Other Ambulatory Visit: Payer: Self-pay | Admitting: Internal Medicine

## 2013-02-15 DIAGNOSIS — R1310 Dysphagia, unspecified: Secondary | ICD-10-CM

## 2013-02-18 ENCOUNTER — Other Ambulatory Visit: Payer: Self-pay | Admitting: Internal Medicine

## 2013-02-18 ENCOUNTER — Ambulatory Visit: Payer: BC Managed Care – PPO

## 2013-02-18 ENCOUNTER — Ambulatory Visit
Admission: RE | Admit: 2013-02-18 | Discharge: 2013-02-18 | Disposition: A | Discharge status: Home / Self Care | Payer: BC Managed Care – PPO | Source: Ambulatory Visit | Attending: Internal Medicine | Admitting: Internal Medicine

## 2013-02-18 DIAGNOSIS — R1310 Dysphagia, unspecified: Secondary | ICD-10-CM

## 2013-04-02 ENCOUNTER — Ambulatory Visit
Admission: RE | Admit: 2013-04-02 | Discharge: 2013-04-02 | Disposition: A | Discharge status: Home / Self Care | Payer: BC Managed Care – PPO | Source: Ambulatory Visit | Attending: Allergy and Immunology | Admitting: Allergy and Immunology

## 2013-04-02 ENCOUNTER — Other Ambulatory Visit: Payer: Self-pay | Admitting: Allergy and Immunology

## 2013-04-02 DIAGNOSIS — R059 Cough, unspecified: Secondary | ICD-10-CM

## 2013-04-02 DIAGNOSIS — R05 Cough: Secondary | ICD-10-CM

## 2013-04-20 ENCOUNTER — Other Ambulatory Visit: Payer: Self-pay | Admitting: Gynecology

## 2013-05-17 ENCOUNTER — Encounter (HOSPITAL_COMMUNITY): Payer: Self-pay | Admitting: *Deleted

## 2013-05-17 ENCOUNTER — Encounter (HOSPITAL_COMMUNITY): Payer: Self-pay | Admitting: Pharmacy Technician

## 2013-05-20 ENCOUNTER — Other Ambulatory Visit: Payer: Self-pay | Admitting: Gastroenterology

## 2013-05-20 NOTE — Addendum Note (Signed)
Addended by: Willis Modena on: 05/20/2013 01:31 PM   Modules accepted: Orders

## 2013-05-24 ENCOUNTER — Ambulatory Visit (HOSPITAL_COMMUNITY): Payer: BC Managed Care – PPO | Admitting: Certified Registered Nurse Anesthetist

## 2013-05-24 ENCOUNTER — Ambulatory Visit (HOSPITAL_COMMUNITY)
Admission: RE | Admit: 2013-05-24 | Discharge: 2013-05-24 | Disposition: A | Discharge status: Home / Self Care | Payer: BC Managed Care – PPO | Source: Ambulatory Visit | Attending: Gastroenterology | Admitting: Gastroenterology

## 2013-05-24 ENCOUNTER — Encounter (HOSPITAL_COMMUNITY)
Admission: RE | Disposition: A | Discharge status: Home / Self Care | Payer: Self-pay | Source: Ambulatory Visit | Attending: Gastroenterology

## 2013-05-24 ENCOUNTER — Encounter (HOSPITAL_COMMUNITY): Payer: BC Managed Care – PPO | Admitting: Certified Registered Nurse Anesthetist

## 2013-05-24 ENCOUNTER — Encounter (HOSPITAL_COMMUNITY): Payer: Self-pay | Admitting: Certified Registered Nurse Anesthetist

## 2013-05-24 DIAGNOSIS — R9389 Abnormal findings on diagnostic imaging of other specified body structures: Secondary | ICD-10-CM | POA: Insufficient documentation

## 2013-05-24 DIAGNOSIS — K449 Diaphragmatic hernia without obstruction or gangrene: Secondary | ICD-10-CM | POA: Insufficient documentation

## 2013-05-24 DIAGNOSIS — R079 Chest pain, unspecified: Secondary | ICD-10-CM | POA: Insufficient documentation

## 2013-05-24 DIAGNOSIS — R1310 Dysphagia, unspecified: Secondary | ICD-10-CM | POA: Insufficient documentation

## 2013-05-24 HISTORY — DX: Gastro-esophageal reflux disease without esophagitis: K21.9

## 2013-05-24 HISTORY — DX: Other allergic rhinitis: J30.89

## 2013-05-24 HISTORY — PX: ESOPHAGEAL MANOMETRY: SHX5429

## 2013-05-24 HISTORY — DX: Unspecified asthma, uncomplicated: J45.909

## 2013-05-24 HISTORY — PX: ESOPHAGOGASTRODUODENOSCOPY (EGD) WITH PROPOFOL: SHX5813

## 2013-05-24 SURGERY — ESOPHAGOGASTRODUODENOSCOPY (EGD) WITH PROPOFOL
Anesthesia: Monitor Anesthesia Care

## 2013-05-24 SURGERY — MANOMETRY, ESOPHAGUS

## 2013-05-24 MED ORDER — PROPOFOL 10 MG/ML IV BOLUS
INTRAVENOUS | Status: DC | PRN
  Administered 2013-05-24: 50 mg via INTRAVENOUS

## 2013-05-24 MED ORDER — BUTAMBEN-TETRACAINE-BENZOCAINE 2-2-14 % EX AERO
INHALATION_SPRAY | CUTANEOUS | Status: DC | PRN
  Administered 2013-05-24: 2 via TOPICAL

## 2013-05-24 MED ORDER — SODIUM CHLORIDE 0.9 % IV SOLN: INTRAVENOUS | Status: DC

## 2013-05-24 MED ORDER — PROPOFOL INFUSION 10 MG/ML OPTIME
INTRAVENOUS | Status: DC | PRN
  Administered 2013-05-24: 140 ug/kg/min via INTRAVENOUS

## 2013-05-24 MED ORDER — LACTATED RINGERS IV SOLN
INTRAVENOUS | Status: DC
  Administered 2013-05-24: 09:00:00 via INTRAVENOUS
  Administered 2013-05-24: 1000 mL via INTRAVENOUS

## 2013-05-24 MED ORDER — LIDOCAINE HCL (CARDIAC) 20 MG/ML IV SOLN
INTRAVENOUS | Status: DC | PRN
  Administered 2013-05-24: 80 mg via INTRAVENOUS

## 2013-05-24 MED ORDER — LIDOCAINE VISCOUS 2 % MT SOLN
OROMUCOSAL | Status: AC
  Filled 2013-05-24: qty 15

## 2013-05-24 SURGICAL SUPPLY — 14 items

## 2013-05-24 SURGICAL SUPPLY — 1 items: FACESHIELD LNG OPTICON STERILE (SAFETY) IMPLANT

## 2013-05-24 NOTE — Transfer of Care (Signed)
Immediate Anesthesia Transfer of Care Note  Patient: Anna Torres  Procedure(s) Performed: Procedure(s): ESOPHAGOGASTRODUODENOSCOPY (EGD) WITH PROPOFOL (N/A)  Patient Location: PACU and Endoscopy Unit  Anesthesia Type:MAC  Level of Consciousness: awake and alert   Airway & Oxygen Therapy: Patient Spontanous Breathing and Patient connected to face mask oxygen  Post-op Assessment: Report given to PACU RN and Post -op Vital signs reviewed and stable  Post vital signs: Reviewed and stable  Complications: No apparent anesthesia complications

## 2013-05-24 NOTE — Anesthesia Preprocedure Evaluation (Signed)
Anesthesia Evaluation  Patient identified by MRN, date of birth, ID band Patient awake    Reviewed: Allergy & Precautions, H&P , Patient's Chart, lab work & pertinent test results, reviewed documented beta blocker date and time   History of Anesthesia Complications Negative for: history of anesthetic complications  Airway Mallampati: II  Neck ROM: full    Dental no notable dental hx.    Pulmonary asthma ,  breath sounds clear to auscultation  Pulmonary exam normal       Cardiovascular Exercise Tolerance: Good negative cardio ROS  Rhythm:regular Rate:Normal     Neuro/Psych negative neurological ROS  negative psych ROS   GI/Hepatic Neg liver ROS, GERD-  ,  Endo/Other  negative endocrine ROS  Renal/GU negative Renal ROS     Musculoskeletal   Abdominal   Peds  Hematology negative hematology ROS (+)   Anesthesia Other Findings CIN I (cervical intraepithelial neoplasia I)     Vitamin D deficiency        Ureteral reflux 1975    Reproductive/Obstetrics negative OB ROS                           Anesthesia Physical  Anesthesia Plan  ASA: II  Anesthesia Plan: MAC   Post-op Pain Management:    Induction: Intravenous  Airway Management Planned:   Additional Equipment:   Intra-op Plan:   Post-operative Plan:   Informed Consent: I have reviewed the patients History and Physical, chart, labs and discussed the procedure including the risks, benefits and alternatives for the proposed anesthesia with the patient or authorized representative who has indicated his/her understanding and acceptance.   Dental advisory given  Plan Discussed with: CRNA  Anesthesia Plan Comments:         Anesthesia Quick Evaluation

## 2013-05-24 NOTE — Op Note (Signed)
[facility] [address] [place], [address]   ENDOSCOPY PROCEDURE REPORT  PATIENT: Anna Torres  MR#: [id number] BIRTHDATE: [birth year] , 42  yrs. old GENDER: Female ENDOSCOPIST: Willis Modena, MD REFERRED BY:  Laurette Schimke, M.D. PROCEDURE DATE:  05/24/2013 PROCEDURE:  EGD, diagnostic ASA CLASS:     Class II INDICATIONS:  chest pain, dysphagia, abnormal barium esophagram. MEDICATIONS: MAC sedation, administered by CRNA TOPICAL ANESTHETIC: Cetacaine Spray  DESCRIPTION OF PROCEDURE: After the risks benefits and alternatives of the procedure were thoroughly explained, informed consent was obtained.  The diagnostic forward-viewing endoscope was introduced through the mouth and advanced to the second portion of the duodenum. Without limitations.  The instrument was slowly withdrawn as the mucosa was fully examined.     Findings:  Normal esophagus; specifically no mucosa features to suggest eosinophilic esophagitis; no esophageal stricture or mass; GE junction widely patient without any stenosis.  Small hiatal hernia, otherwise normal stomach and pylorus.  Normal duodenum to the second portion.              The scope was then withdrawn from the patient and the procedure completed.  ENDOSCOPIC IMPRESSION:     As above.  No explanation for patient's dysphagia or chest pain was identified.  RECOMMENDATIONS:     1.  Watch for potential complications of procedure. 2.  Continue rantidine and dexlansoprazole for the time-being. 3.  Await esophageal manometry findings. 4.  Next step in management is pending manometry results.  eSigned:  Willis Modena, MD 05/24/2013 9:49 AM   CC:

## 2013-05-24 NOTE — Anesthesia Postprocedure Evaluation (Signed)
Anesthesia Post Note  Patient: Anna Torres  Procedure(s) Performed: Procedure(s) (LRB): ESOPHAGOGASTRODUODENOSCOPY (EGD) WITH PROPOFOL (N/A)  Anesthesia type: MAC  Patient location: PACU  Post pain: Pain level controlled  Post assessment: Post-op Vital signs reviewed  Last Vitals: BP 118/73  Temp(Src) 36.8 C (Oral)  Resp 19  Ht 5\' 2"  (1.575 m)  Wt 130 lb (58.968 kg)  BMI 23.77 kg/m2  SpO2 100%  LMP 03/15/2013  Post vital signs: Reviewed  Level of consciousness: awake  Complications: No apparent anesthesia complications

## 2013-05-24 NOTE — Discharge Instructions (Signed)
Endoscopy °Care After °Please read the instructions outlined below and refer to this sheet in the next few weeks. These discharge instructions provide you with general information on caring for yourself after you leave the hospital. Your doctor may also give you specific instructions. While your treatment has been planned according to the most current medical practices available, unavoidable complications occasionally occur. If you have any problems or questions after discharge, please call Dr. Brandice Busser ([facility]) at [phone]. ° °HOME CARE INSTRUCTIONS °Activity °· You may resume your regular activity but move at a slower pace for the next 24 hours.  °· Take frequent rest periods for the next 24 hours.  °· Walking will help expel (get rid of) the air and reduce the bloated feeling in your abdomen.  °· No driving for 24 hours (because of the anesthesia (medicine) used during the test).  °· You may shower.  °· Do not sign any important legal documents or operate any machinery for 24 hours (because of the anesthesia used during the test).  °Nutrition °· Drink plenty of fluids.  °· You may resume your normal diet.  °· Begin with a light meal and progress to your normal diet.  °· Avoid alcoholic beverages for 24 hours or as instructed by your caregiver.  °Medications °You may resume your normal medications unless your caregiver tells you otherwise. °What you can expect today °· You may experience abdominal discomfort such as a feeling of fullness or "gas" pains.  °· You may experience a sore throat for 2 to 3 days. This is normal. Gargling with salt water may help this.  °·  °SEEK IMMEDIATE MEDICAL CARE IF: °· You have excessive nausea (feeling sick to your stomach) and/or vomiting.  °· You have severe abdominal pain and distention (swelling).  °· You have trouble swallowing.  °· You have a temperature over 100° F (37.8° C).  °· You have rectal bleeding or vomiting of blood.  °Document Released:  08/22/2003 Document Revised: 09/19/2010 Document Reviewed: 03/04/2007 °ExitCare® Patient Information ©2012 [employer]. °

## 2013-05-24 NOTE — H&P (Signed)
Patient interval history reviewed.  Patient examined again.  There has been no change from documented H/P dated 05/14/13 copied into chart from our office) except as documented above.  Assessment:  1.  Chest pain. 2.  Dysphagia.  3.  Abnormal barium esophagram; poor peristalsis.  Plan:  1.  Endoscopy with possible esophageal dilatation. 2.  Risks (bleeding, infection, bowel perforation that could require surgery, sedation-related changes in cardiopulmonary systems), benefits (identification and possible treatment of source of symptoms, exclusion of certain causes of symptoms), and alternatives (watchful waiting, radiographic imaging studies, empiric medical treatment) of upper endoscopy (EGD) were explained to patient/family in detail and patient wishes to proceed.

## 2013-05-25 ENCOUNTER — Encounter (HOSPITAL_COMMUNITY): Payer: Self-pay | Admitting: Gastroenterology

## 2013-08-23 ENCOUNTER — Other Ambulatory Visit: Payer: Self-pay | Admitting: Gynecology

## 2013-09-01 ENCOUNTER — Other Ambulatory Visit (HOSPITAL_COMMUNITY)
Admission: RE | Admit: 2013-09-01 | Discharge: 2013-09-01 | Disposition: A | Discharge status: Home / Self Care | Payer: 59 | Source: Ambulatory Visit | Attending: Internal Medicine | Admitting: Internal Medicine

## 2013-09-01 DIAGNOSIS — Z01419 Encounter for gynecological examination (general) (routine) without abnormal findings: Secondary | ICD-10-CM | POA: Diagnosis not present

## 2013-09-02 ENCOUNTER — Other Ambulatory Visit: Payer: Self-pay | Admitting: Registered Nurse

## 2013-09-06 LAB — CYTOLOGY - PAP

## 2013-11-30 ENCOUNTER — Other Ambulatory Visit: Payer: Self-pay | Admitting: Neurosurgery

## 2014-01-06 ENCOUNTER — Telehealth: Payer: Self-pay | Admitting: Internal Medicine

## 2014-01-18 NOTE — Telephone Encounter (Signed)
ERROR

## 2014-01-24 ENCOUNTER — Encounter (HOSPITAL_COMMUNITY)
Admission: RE | Admit: 2014-01-24 | Discharge: 2014-01-24 | Disposition: A | Discharge status: Home / Self Care | Payer: 59 | Source: Ambulatory Visit | Attending: Neurosurgery | Admitting: Neurosurgery

## 2014-01-24 ENCOUNTER — Encounter (HOSPITAL_COMMUNITY): Payer: Self-pay

## 2014-01-24 DIAGNOSIS — Z01812 Encounter for preprocedural laboratory examination: Secondary | ICD-10-CM | POA: Diagnosis not present

## 2014-01-24 HISTORY — DX: Headache, unspecified: R51.9

## 2014-01-24 HISTORY — DX: Headache: R51

## 2014-01-24 HISTORY — DX: Herpesviral infection of other urogenital tract: A60.09

## 2014-01-24 LAB — BASIC METABOLIC PANEL
Anion gap: 9 (ref 5–15)
BUN: 10 mg/dL (ref 6–23)
CO2: 22 mmol/L (ref 19–32)
Calcium: 9.6 mg/dL (ref 8.4–10.5)
Chloride: 108 mEq/L (ref 96–112)
Creatinine, Ser: 0.78 mg/dL (ref 0.50–1.10)
GFR calc Af Amer: >90 mL/min (ref >90)
GFR calc non Af Amer: >90 mL/min (ref >90)
GLUCOSE: 115 mg/dL — AB (ref 70–99)
POTASSIUM: 4.6 mmol/L (ref 3.5–5.1)
Sodium: 139 mmol/L (ref 135–145)

## 2014-01-24 LAB — HCG, SERUM, QUALITATIVE: PREG SERUM: NEGATIVE

## 2014-01-24 LAB — SURGICAL PCR SCREEN
MRSA, PCR: NEGATIVE
Staphylococcus aureus: NEGATIVE

## 2014-01-24 NOTE — Pre-Procedure Instructions (Signed)
Anna Torres  01/24/2014   Your procedure is scheduled on: Monday, January 31, 2014  Report to [facility] Admitting at 5:30 AM.  Call this number if you have problems the morning of surgery: [phone]   Remember:   Do not eat food or drink liquids after midnight Sunday, January 30, 2014   Take these medicines the morning of surgery with A SIP OF WATER: fexofenadine (ALLEGRA), beclomethasone (QVAR)  inhaler, pantoprazole (PROTONIX,  if scheduled day to take)   Stop taking Aspirin, vitamins, and herbal medications (GLUCOSAMINE-CHONDROITIN) . Do not take any NSAIDs ie: Ibuprofen, Advil, Naproxen or any medication containing Aspirin; stop now.   Do not wear jewelry, make-up or nail polish.  Do not wear lotions, powders, or perfumes. You may not wear deodorant.  Do not shave 48 hours prior to surgery.   Do not bring valuables to the hospital.  [facility] is not responsible for any belongings or valuables.               Contacts, dentures or bridgework may not be worn into surgery.  Leave suitcase in the car. After surgery it may be brought to your room.  For patients admitted to the hospital, discharge time is determined by your treatment team.               Patients discharged the day of surgery will not be allowed to drive home.  Name and phone number of your driver:   Special Instructions:  Special Instructions:Special Instructions: [facility] - Preparing for Surgery  Before surgery, you can play an important role.  Because skin is not sterile, your skin needs to be as free of germs as possible.  You can reduce the number of germs on you skin by washing with CHG (chlorahexidine gluconate) soap before surgery.  CHG is an antiseptic cleaner which kills germs and bonds with the skin to continue killing germs even after washing.  Please DO NOT use if you have an allergy to CHG or antibacterial soaps.  If your skin becomes reddened/irritated stop using the CHG and inform  your nurse when you arrive at Short Stay.  Do not shave (including legs and underarms) for at least 48 hours prior to the first CHG shower.  You may shave your face.  Please follow these instructions carefully:   1.  Shower with CHG Soap the night before surgery and the morning of Surgery.  2.  If you choose to wash your hair, wash your hair first as usual with your normal shampoo.  3.  After you shampoo, rinse your hair and body thoroughly to remove the Shampoo.  4.  Use CHG as you would any other liquid soap.  You can apply chg directly  to the skin and wash gently with scrungie or a clean washcloth.  5.  Apply the CHG Soap to your body ONLY FROM THE NECK DOWN.  Do not use on open wounds or open sores.  Avoid contact with your eyes, ears, mouth and genitals (private parts).  Wash genitals (private parts) with your normal soap.  6.  Wash thoroughly, paying special attention to the area where your surgery will be performed.  7.  Thoroughly rinse your body with warm water from the neck down.  8.  DO NOT shower/wash with your normal soap after using and rinsing off the CHG Soap.  9.  Pat yourself dry with a clean towel.  10.  Wear clean pajamas.            11.  Place clean sheets on your bed the night of your first shower and do not sleep with pets.  Day of Surgery  Do not apply any lotions/deodorants the morning of surgery.  Please wear clean clothes to the hospital/surgery center.   Please read over the following fact sheets that you were given: Pain Booklet, Coughing and Deep Breathing, MRSA Information and Surgical Site Infection Prevention

## 2014-01-26 NOTE — Progress Notes (Signed)
Office notified of need for echo, cxr and stress test.

## 2014-01-30 MED ORDER — CEFAZOLIN SODIUM-DEXTROSE 2-3 GM-% IV SOLR
2.0000 g | INTRAVENOUS | Status: AC
  Administered 2014-01-31: 2 g via INTRAVENOUS
  Filled 2014-01-30: qty 50

## 2014-01-31 ENCOUNTER — Ambulatory Visit (HOSPITAL_COMMUNITY): Payer: 59

## 2014-01-31 ENCOUNTER — Ambulatory Visit (HOSPITAL_COMMUNITY): Payer: 59 | Admitting: Certified Registered Nurse Anesthetist

## 2014-01-31 ENCOUNTER — Encounter (HOSPITAL_COMMUNITY): Payer: Self-pay | Admitting: *Deleted

## 2014-01-31 ENCOUNTER — Encounter (HOSPITAL_COMMUNITY)
Admission: RE | Disposition: A | Discharge status: Home / Self Care | Payer: 59 | Source: Ambulatory Visit | Attending: Neurosurgery

## 2014-01-31 ENCOUNTER — Ambulatory Visit (HOSPITAL_COMMUNITY)
Admission: RE | Admit: 2014-01-31 | Discharge: 2014-02-01 | Disposition: A | Discharge status: Home / Self Care | Payer: 59 | Source: Ambulatory Visit | Attending: Neurosurgery | Admitting: Neurosurgery

## 2014-01-31 DIAGNOSIS — M4722 Other spondylosis with radiculopathy, cervical region: Secondary | ICD-10-CM | POA: Diagnosis present

## 2014-01-31 DIAGNOSIS — J45909 Unspecified asthma, uncomplicated: Secondary | ICD-10-CM | POA: Diagnosis not present

## 2014-01-31 DIAGNOSIS — M47812 Spondylosis without myelopathy or radiculopathy, cervical region: Secondary | ICD-10-CM | POA: Diagnosis present

## 2014-01-31 DIAGNOSIS — K219 Gastro-esophageal reflux disease without esophagitis: Secondary | ICD-10-CM | POA: Insufficient documentation

## 2014-01-31 DIAGNOSIS — Z885 Allergy status to narcotic agent status: Secondary | ICD-10-CM | POA: Insufficient documentation

## 2014-01-31 DIAGNOSIS — B0089 Other herpesviral infection: Secondary | ICD-10-CM | POA: Diagnosis not present

## 2014-01-31 DIAGNOSIS — Z79899 Other long term (current) drug therapy: Secondary | ICD-10-CM | POA: Insufficient documentation

## 2014-01-31 DIAGNOSIS — Z419 Encounter for procedure for purposes other than remedying health state, unspecified: Secondary | ICD-10-CM

## 2014-01-31 HISTORY — PX: ANTERIOR CERVICAL DECOMP/DISCECTOMY FUSION: SHX1161

## 2014-01-31 LAB — CBC
HCT: 38 % (ref 36.0–46.0)
Hemoglobin: 12.9 g/dL (ref 12.0–15.0)
MCH: 26.9 pg (ref 26.0–34.0)
MCHC: 33.9 g/dL (ref 30.0–36.0)
MCV: 79.3 fL (ref 78.0–100.0)
Platelets: 358 10*3/uL (ref 150–400)
RBC: 4.79 MIL/uL (ref 3.87–5.11)
RDW: 13.6 % (ref 11.5–15.5)
WBC: 10.4 10*3/uL (ref 4.0–10.5)

## 2014-01-31 SURGERY — ANTERIOR CERVICAL DECOMPRESSION/DISCECTOMY FUSION 2 LEVELS
Anesthesia: General | Site: Spine Cervical

## 2014-01-31 MED ORDER — ONDANSETRON HCL 4 MG/2ML IJ SOLN
INTRAMUSCULAR | Status: AC
  Filled 2014-01-31: qty 2

## 2014-01-31 MED ORDER — HYDROMORPHONE HCL 1 MG/ML IJ SOLN
0.5000 mg | INTRAMUSCULAR | Status: DC | PRN
  Administered 2014-01-31 (×3): 1 mg via INTRAVENOUS
  Filled 2014-01-31 (×2): qty 1

## 2014-01-31 MED ORDER — ROCURONIUM BROMIDE 50 MG/5ML IV SOLN
INTRAVENOUS | Status: AC
  Filled 2014-01-31: qty 1

## 2014-01-31 MED ORDER — ROCURONIUM BROMIDE 100 MG/10ML IV SOLN
INTRAVENOUS | Status: DC | PRN
  Administered 2014-01-31: 40 mg via INTRAVENOUS

## 2014-01-31 MED ORDER — PROPOFOL 10 MG/ML IV BOLUS
INTRAVENOUS | Status: AC
  Filled 2014-01-31: qty 20

## 2014-01-31 MED ORDER — ALUM & MAG HYDROXIDE-SIMETH 200-200-20 MG/5ML PO SUSP: 30.0000 mL | Freq: Four times a day (QID) | ORAL | Status: DC | PRN

## 2014-01-31 MED ORDER — FLUTICASONE PROPIONATE HFA 44 MCG/ACT IN AERO
1.0000 | INHALATION_SPRAY | Freq: Two times a day (BID) | RESPIRATORY_TRACT | Status: DC
  Filled 2014-01-31: qty 10.6

## 2014-01-31 MED ORDER — MIDAZOLAM HCL 2 MG/2ML IJ SOLN
0.5000 mg | INTRAMUSCULAR | Status: DC | PRN
Start: 2014-01-31 — End: 2014-01-31
  Administered 2014-01-31: 0.5 mg via INTRAVENOUS

## 2014-01-31 MED ORDER — ONDANSETRON HCL 4 MG/2ML IJ SOLN
INTRAMUSCULAR | Status: DC | PRN
  Administered 2014-01-31: 4 mg via INTRAVENOUS

## 2014-01-31 MED ORDER — NEOSTIGMINE METHYLSULFATE 10 MG/10ML IV SOLN
INTRAVENOUS | Status: DC | PRN
  Administered 2014-01-31: 4 mg via INTRAVENOUS

## 2014-01-31 MED ORDER — SODIUM CHLORIDE 0.9 % IJ SOLN: 3.0000 mL | INTRAMUSCULAR | Status: DC | PRN

## 2014-01-31 MED ORDER — GLYCOPYRROLATE 0.2 MG/ML IJ SOLN
INTRAMUSCULAR | Status: DC | PRN
  Administered 2014-01-31: 0.6 mg via INTRAVENOUS

## 2014-01-31 MED ORDER — MIDAZOLAM HCL 5 MG/5ML IJ SOLN
INTRAMUSCULAR | Status: DC | PRN
  Administered 2014-01-31: 2 mg via INTRAVENOUS

## 2014-01-31 MED ORDER — FENTANYL CITRATE 0.05 MG/ML IJ SOLN
INTRAMUSCULAR | Status: AC
  Filled 2014-01-31: qty 5

## 2014-01-31 MED ORDER — FENTANYL CITRATE 0.05 MG/ML IJ SOLN
INTRAMUSCULAR | Status: AC
  Filled 2014-01-31: qty 2

## 2014-01-31 MED ORDER — SODIUM CHLORIDE 0.9 % IJ SOLN
3.0000 mL | Freq: Two times a day (BID) | INTRAMUSCULAR | Status: DC
  Administered 2014-01-31: 3 mL via INTRAVENOUS

## 2014-01-31 MED ORDER — ADULT MULTIVITAMIN W/MINERALS CH
1.0000 | ORAL_TABLET | Freq: Every day | ORAL | Status: DC
  Filled 2014-01-31 (×2): qty 1

## 2014-01-31 MED ORDER — FENTANYL CITRATE 0.05 MG/ML IJ SOLN
INTRAMUSCULAR | Status: DC | PRN
  Administered 2014-01-31: 50 ug via INTRAVENOUS
  Administered 2014-01-31: 100 ug via INTRAVENOUS
  Administered 2014-01-31 (×2): 50 ug via INTRAVENOUS
  Administered 2014-01-31: 100 ug via INTRAVENOUS
  Administered 2014-01-31 (×2): 50 ug via INTRAVENOUS

## 2014-01-31 MED ORDER — MENTHOL 3 MG MT LOZG: 1.0000 | LOZENGE | OROMUCOSAL | Status: DC | PRN

## 2014-01-31 MED ORDER — DEXAMETHASONE SODIUM PHOSPHATE 10 MG/ML IJ SOLN
10.0000 mg | INTRAMUSCULAR | Status: AC
  Administered 2014-01-31: 10 mg via INTRAVENOUS
  Filled 2014-01-31: qty 1

## 2014-01-31 MED ORDER — ACYCLOVIR 400 MG PO TABS
400.0000 mg | ORAL_TABLET | ORAL | Status: DC
  Filled 2014-01-31: qty 1

## 2014-01-31 MED ORDER — FENTANYL CITRATE 0.05 MG/ML IJ SOLN
25.0000 ug | INTRAMUSCULAR | Status: DC | PRN
  Administered 2014-01-31 (×2): 25 ug via INTRAVENOUS
  Administered 2014-01-31: 50 ug via INTRAVENOUS
  Administered 2014-01-31 (×2): 25 ug via INTRAVENOUS

## 2014-01-31 MED ORDER — GLYCOPYRROLATE 0.2 MG/ML IJ SOLN
INTRAMUSCULAR | Status: AC
  Filled 2014-01-31: qty 3

## 2014-01-31 MED ORDER — OXYCODONE-ACETAMINOPHEN 5-325 MG PO TABS
1.0000 | ORAL_TABLET | ORAL | Status: DC | PRN
  Administered 2014-01-31 – 2014-02-01 (×4): 2 via ORAL
  Filled 2014-01-31 (×3): qty 2

## 2014-01-31 MED ORDER — EPHEDRINE SULFATE 50 MG/ML IJ SOLN
INTRAMUSCULAR | Status: AC
  Filled 2014-01-31: qty 1

## 2014-01-31 MED ORDER — HYDROMORPHONE HCL 1 MG/ML IJ SOLN
INTRAMUSCULAR | Status: AC
  Filled 2014-01-31: qty 1

## 2014-01-31 MED ORDER — ARTIFICIAL TEARS OP OINT
TOPICAL_OINTMENT | OPHTHALMIC | Status: AC
  Filled 2014-01-31: qty 3.5

## 2014-01-31 MED ORDER — LIDOCAINE HCL (CARDIAC) 20 MG/ML IV SOLN
INTRAVENOUS | Status: AC
  Filled 2014-01-31: qty 5

## 2014-01-31 MED ORDER — LIDOCAINE HCL (CARDIAC) 20 MG/ML IV SOLN
INTRAVENOUS | Status: DC | PRN
  Administered 2014-01-31: 60 mg via INTRAVENOUS

## 2014-01-31 MED ORDER — SODIUM CHLORIDE 0.9 % IV SOLN: 250.0000 mL | INTRAVENOUS | Status: DC

## 2014-01-31 MED ORDER — LACTATED RINGERS IV SOLN
INTRAVENOUS | Status: DC | PRN
  Administered 2014-01-31 (×2): via INTRAVENOUS

## 2014-01-31 MED ORDER — ACETAMINOPHEN 650 MG RE SUPP: 650.0000 mg | RECTAL | Status: DC | PRN

## 2014-01-31 MED ORDER — BACITRACIN 50000 UNITS IM SOLR
INTRAMUSCULAR | Status: DC | PRN
  Administered 2014-01-31: 500 mL

## 2014-01-31 MED ORDER — ARTIFICIAL TEARS OP OINT
TOPICAL_OINTMENT | OPHTHALMIC | Status: DC | PRN
  Administered 2014-01-31: 1 via OPHTHALMIC

## 2014-01-31 MED ORDER — CEFAZOLIN SODIUM 1-5 GM-% IV SOLN
1.0000 g | Freq: Three times a day (TID) | INTRAVENOUS | Status: AC
  Administered 2014-01-31 (×2): 1 g via INTRAVENOUS
  Filled 2014-01-31 (×2): qty 50

## 2014-01-31 MED ORDER — MONTELUKAST SODIUM 10 MG PO TABS
10.0000 mg | ORAL_TABLET | Freq: Every day | ORAL | Status: DC
  Administered 2014-01-31: 10 mg via ORAL
  Filled 2014-01-31 (×2): qty 1

## 2014-01-31 MED ORDER — OXYCODONE HCL 5 MG PO TABS: 5.0000 mg | ORAL_TABLET | Freq: Once | ORAL | Status: DC | PRN

## 2014-01-31 MED ORDER — PHENOL 1.4 % MT LIQD
1.0000 | OROMUCOSAL | Status: DC | PRN
  Administered 2014-01-31: 1 via OROMUCOSAL
  Filled 2014-01-31: qty 177

## 2014-01-31 MED ORDER — PANTOPRAZOLE SODIUM 40 MG PO TBEC: 40.0000 mg | DELAYED_RELEASE_TABLET | ORAL | Status: DC

## 2014-01-31 MED ORDER — SODIUM CHLORIDE 0.9 % IJ SOLN
INTRAMUSCULAR | Status: AC
  Filled 2014-01-31: qty 10

## 2014-01-31 MED ORDER — THROMBIN 5000 UNITS EX SOLR
OROMUCOSAL | Status: DC | PRN
  Administered 2014-01-31: 09:00:00 via TOPICAL

## 2014-01-31 MED ORDER — SUCCINYLCHOLINE CHLORIDE 20 MG/ML IJ SOLN
INTRAMUSCULAR | Status: AC
  Filled 2014-01-31: qty 1

## 2014-01-31 MED ORDER — ACETAMINOPHEN 325 MG PO TABS: 650.0000 mg | ORAL_TABLET | ORAL | Status: DC | PRN

## 2014-01-31 MED ORDER — THROMBIN 5000 UNITS EX SOLR
CUTANEOUS | Status: AC
  Filled 2014-01-31: qty 10000

## 2014-01-31 MED ORDER — HEMOSTATIC AGENTS (NO CHARGE) OPTIME
TOPICAL | Status: DC | PRN
  Administered 2014-01-31: 1 via TOPICAL

## 2014-01-31 MED ORDER — MIDAZOLAM HCL 2 MG/2ML IJ SOLN
INTRAMUSCULAR | Status: AC
  Filled 2014-01-31: qty 2

## 2014-01-31 MED ORDER — ONDANSETRON HCL 4 MG/2ML IJ SOLN: 4.0000 mg | Freq: Once | INTRAMUSCULAR | Status: DC | PRN

## 2014-01-31 MED ORDER — CYCLOBENZAPRINE HCL 10 MG PO TABS
10.0000 mg | ORAL_TABLET | Freq: Three times a day (TID) | ORAL | Status: DC | PRN
  Administered 2014-01-31 – 2014-02-01 (×3): 10 mg via ORAL
  Filled 2014-01-31 (×2): qty 1

## 2014-01-31 MED ORDER — OXYCODONE HCL 5 MG/5ML PO SOLN: 5.0000 mg | Freq: Once | ORAL | Status: DC | PRN

## 2014-01-31 MED ORDER — NEOSTIGMINE METHYLSULFATE 10 MG/10ML IV SOLN
INTRAVENOUS | Status: AC
  Filled 2014-01-31: qty 1

## 2014-01-31 MED ORDER — LORATADINE 10 MG PO TABS
10.0000 mg | ORAL_TABLET | Freq: Every day | ORAL | Status: DC
  Filled 2014-01-31: qty 1

## 2014-01-31 MED ORDER — CYCLOBENZAPRINE HCL 10 MG PO TABS
ORAL_TABLET | ORAL | Status: AC
  Filled 2014-01-31: qty 1

## 2014-01-31 MED ORDER — DEXAMETHASONE SODIUM PHOSPHATE 10 MG/ML IJ SOLN
10.0000 mg | Freq: Four times a day (QID) | INTRAMUSCULAR | Status: DC
  Administered 2014-01-31 – 2014-02-01 (×4): 10 mg via INTRAVENOUS
  Filled 2014-01-31 (×8): qty 1

## 2014-01-31 MED ORDER — PROPOFOL 10 MG/ML IV BOLUS
INTRAVENOUS | Status: DC | PRN
  Administered 2014-01-31: 140 mg via INTRAVENOUS

## 2014-01-31 MED ORDER — THROMBIN 5000 UNITS EX SOLR
CUTANEOUS | Status: DC | PRN
  Administered 2014-01-31 (×2): 5000 [IU] via TOPICAL

## 2014-01-31 MED ORDER — 0.9 % SODIUM CHLORIDE (POUR BTL) OPTIME
TOPICAL | Status: DC | PRN
  Administered 2014-01-31: 1000 mL

## 2014-01-31 MED ORDER — ONDANSETRON HCL 4 MG/2ML IJ SOLN: 4.0000 mg | INTRAMUSCULAR | Status: DC | PRN

## 2014-01-31 MED ORDER — ACYCLOVIR 200 MG PO CAPS
400.0000 mg | ORAL_CAPSULE | ORAL | Status: DC
  Filled 2014-01-31: qty 2

## 2014-01-31 SURGICAL SUPPLY — 62 items
BAG DECANTER FOR FLEXI CONT (MISCELLANEOUS) ×2 IMPLANT
BENZOIN TINCTURE PRP APPL 2/3 (GAUZE/BANDAGES/DRESSINGS) ×2 IMPLANT
BRUSH SCRUB EZ PLAIN DRY (MISCELLANEOUS) ×2 IMPLANT
BUR MATCHSTICK NEURO 3.0 LAGG (BURR) ×2 IMPLANT
CANISTER SUCT 3000ML (MISCELLANEOUS) ×2 IMPLANT
CONT SPEC 4OZ CLIKSEAL STRL BL (MISCELLANEOUS) ×2 IMPLANT
COVER SURGICAL LIGHT HANDLE (MISCELLANEOUS) ×2 IMPLANT
DRAPE C-ARM 42X72 X-RAY (DRAPES) ×4 IMPLANT
DRAPE LAPAROTOMY 100X72 PEDS (DRAPES) ×2 IMPLANT
DRAPE MICROSCOPE LEICA (MISCELLANEOUS) ×2 IMPLANT
DRAPE POUCH INSTRU U-SHP 10X18 (DRAPES) ×2 IMPLANT
DRSG OPSITE POSTOP 4X6 (GAUZE/BANDAGES/DRESSINGS) ×2 IMPLANT
DURAPREP 26ML APPLICATOR (WOUND CARE) ×2 IMPLANT
DURAPREP 6ML APPLICATOR 50/CS (WOUND CARE) IMPLANT
ELECT COATED BLADE 2.86 ST (ELECTRODE) ×2 IMPLANT
ELECT REM PT RETURN 9FT ADLT (ELECTROSURGICAL) ×2
ELECTRODE REM PT RTRN 9FT ADLT (ELECTROSURGICAL) ×1 IMPLANT
GAUZE SPONGE 4X4 12PLY STRL (GAUZE/BANDAGES/DRESSINGS) IMPLANT
GAUZE SPONGE 4X4 16PLY XRAY LF (GAUZE/BANDAGES/DRESSINGS) IMPLANT
GLOVE BIO SURGEON ST LM GN SZ9 (GLOVE) ×2 IMPLANT
GLOVE BIO SURGEON STRL SZ 6.5 (GLOVE) ×4 IMPLANT
GLOVE BIO SURGEON STRL SZ8 (GLOVE) ×2 IMPLANT
GLOVE BIOGEL PI IND STRL 6.5 (GLOVE) ×1 IMPLANT
GLOVE BIOGEL PI INDICATOR 6.5 (GLOVE) ×1
GLOVE ECLIPSE 7.0 STRL STRAW (GLOVE) ×2 IMPLANT
GLOVE EXAM NITRILE LRG STRL (GLOVE) IMPLANT
GLOVE EXAM NITRILE MD LF STRL (GLOVE) IMPLANT
GLOVE EXAM NITRILE XL STR (GLOVE) IMPLANT
GLOVE EXAM NITRILE XS STR PU (GLOVE) IMPLANT
GLOVE INDICATOR 8.5 STRL (GLOVE) ×2 IMPLANT
GOWN STRL REUS W/ TWL LRG LVL3 (GOWN DISPOSABLE) ×1 IMPLANT
GOWN STRL REUS W/ TWL XL LVL3 (GOWN DISPOSABLE) ×2 IMPLANT
GOWN STRL REUS W/TWL 2XL LVL3 (GOWN DISPOSABLE) IMPLANT
GOWN STRL REUS W/TWL LRG LVL3 (GOWN DISPOSABLE) ×1
GOWN STRL REUS W/TWL XL LVL3 (GOWN DISPOSABLE) ×2
HALTER HD/CHIN CERV TRACTION D (MISCELLANEOUS) ×2 IMPLANT
HEMOSTAT POWDER KIT SURGIFOAM (HEMOSTASIS) ×2 IMPLANT
KIT BASIN OR (CUSTOM PROCEDURE TRAY) ×2 IMPLANT
KIT ROOM TURNOVER OR (KITS) ×2 IMPLANT
LIQUID BAND (GAUZE/BANDAGES/DRESSINGS) ×2 IMPLANT
NEEDLE SPNL 20GX3.5 QUINCKE YW (NEEDLE) ×2 IMPLANT
NS IRRIG 1000ML POUR BTL (IV SOLUTION) ×2 IMPLANT
PACK LAMINECTOMY NEURO (CUSTOM PROCEDURE TRAY) ×2 IMPLANT
PAD ARMBOARD 7.5X6 YLW CONV (MISCELLANEOUS) ×6 IMPLANT
PATTIES SURGICAL .5 X.5 (GAUZE/BANDAGES/DRESSINGS) ×2 IMPLANT
PLATE ANT CERV XTEND 2 LV (Plate) ×2 IMPLANT
RUBBERBAND STERILE (MISCELLANEOUS) ×4 IMPLANT
SCREW SELF TAP VARIABLE 4.6X12 (Screw) ×4 IMPLANT
SCREW VAR 4.2 XD SELF DRILL 12 (Screw) ×8 IMPLANT
SPACER CERVICAL FRGE 12X14X6-7 (Spacer) ×2 IMPLANT
SPACER CERVICAL FRGE 12X14X7-7 (Spacer) ×2 IMPLANT
SPONGE INTESTINAL PEANUT (DISPOSABLE) ×4 IMPLANT
SPONGE SURGIFOAM ABS GEL SZ50 (HEMOSTASIS) ×2 IMPLANT
STRIP CLOSURE SKIN 1/2X4 (GAUZE/BANDAGES/DRESSINGS) ×2 IMPLANT
SUT VIC AB 3-0 SH 8-18 (SUTURE) ×2 IMPLANT
SUT VICRYL 4-0 PS2 18IN ABS (SUTURE) ×2 IMPLANT
SYR 20ML ECCENTRIC (SYRINGE) ×2 IMPLANT
TAPE CLOTH 4X10 WHT NS (GAUZE/BANDAGES/DRESSINGS) IMPLANT
TOWEL OR 17X24 6PK STRL BLUE (TOWEL DISPOSABLE) ×2 IMPLANT
TOWEL OR 17X26 10 PK STRL BLUE (TOWEL DISPOSABLE) ×2 IMPLANT
TRAP SPECIMEN MUCOUS 40CC (MISCELLANEOUS) ×2 IMPLANT
WATER STERILE IRR 1000ML POUR (IV SOLUTION) ×2 IMPLANT

## 2014-01-31 NOTE — Op Note (Signed)
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 Utilizing allograft wedges and the globus extend plating system with a 34 mm plate from E9-B2  Surgeon: Jillyn Hidden Kirklin Mcduffee  Asst.: Sherilyn Cooter pool  Anesthesia: Gen.  EBL: Minimal  History of present illness: Patient is a very pleasant 44 year old female subluxing neck pain and undergone previous ACDF at C4-5 and did very well over last several weeks and months she is a progress worsening neck pain and predominantly left arm pain rating to her forearm into her thumb and forefinger consistent with a C6 and C7 nerve root pattern. Workup revealed cervical spondylosis with stenosis and breakdown below the level of her previous fusion. Showed severe stenosis of the left C6 and C7 neural foramina. Due to failure conservative treatment imaging findings and progression of clinical syndrome I recommended ACDF at C5-6 and C6-7. I extensively reviewed the risks and benefits of the operation with the patient as well as perioperative course expectations of outcome and alternatives surgery and she understands and agrees to proceed forward.  Operative procedure: Patient brought into the or was induced under general anesthesia positioned supine the neck in slight extension in 5 pounds of halter traction the right side of neck was prepped and draped in routine sterile fashion preoperative x-ray localize the appropriate level. So a curvilinear incision was made just off midline to the interbody the sternomastoid and the superficial layer of the platysma was dissected and divided longitudinally the avascular plane to sternomastoid and strap muscles was developed down to the previous fashion. The prevertebral fascia was dissected with Kitners. The old plate  was immediately identified dissected free and removed. Then attention was taken to 2 disc spaces below this annulotomy's were made with a 15 blade scalpel marked the disc space longus Richardson Dopp as reflected laterally and self-retaining retractors placed. The spaces were cleaned out with pituitary rongeurs BA curette and high-speed drill. Under endoscopic illumination first working at C5-6 aggressive under biting of the posterior longitudinal ligament and endplates allowed decompression central canal arching laterally on the left there was a large spur coming off the C5 vertebral body this was aggressively under been decompress the proximal C6 neural foramina. Both C6 nerve roots were then skeletonized flush with pedicle and after confirmation central and foraminal decompression attention was then moved to C6-7. C6 and was cleanout a similar fashion disc space is drilled down the posterior annuluscomplex again a large posterior spur coming off the left side of the C6 vertebral body was aggressively under been decompress the central canal proximal C7 foramina. After adequate decompression centrally and foraminally both disc spaces were cleaned out meticulously stasis was maintained copiously irrigated and endplates were prepared to receive the allografts. 2 allografts were inserted 1-2 mm deep to the anterior vertebral A-line. The plate was incised up all screws are placed used the old holes at C5 with rescue screws which had excellent purchase 4 distal screws are placed all screws excellent purchase locking mechanism was engaged and posterior fluoroscopy confirmed good position of the plates and implants. Wounds encompassing irrigated meticulous hemostasis was maintained the wounds and reapproximated with interrupted Vicryl in the platysma and a running 4 subcuticular. Dermabond benzo and Steri-Strips sterile dressing was applied patient recovered in stable condition. At the end of case all needle counts sponge counts  were correct.

## 2014-01-31 NOTE — Transfer of Care (Signed)
Immediate Anesthesia Transfer of Care Note  Patient: Anna Torres  Procedure(s) Performed: Procedure(s): ANTERIOR CERVICAL DECOMPRESSION/DISCECTOMY FUSION CERVICAL FIVE-SIX,CERVICAL SIX-SEVEN WITH HARDWARE REMOVAL OF CODMAN PLATE AT CERVIAL FOUR-FIVE (N/A)  Patient Location: PACU  Anesthesia Type:General  Level of Consciousness: awake, alert , oriented and patient cooperative  Airway & Oxygen Therapy: Patient Spontanous Breathing and Patient connected to face mask oxygen  Post-op Assessment: Report given to PACU RN, Post -op Vital signs reviewed and stable and Patient moving all extremities X 4  Post vital signs: Reviewed and stable  Complications: No apparent anesthesia complications

## 2014-01-31 NOTE — Progress Notes (Signed)
Patient states she feels a pressure in her chest and pain down her right arm Dr. Noreene Larsson notified and EKG ordered. Patient states she has not had pain in her right arm before surgery that all her pain and symptoms were in the left arm.  VSS no complaints of SOB A&O x3. Will continue to monitor

## 2014-01-31 NOTE — H&P (Signed)
Anna Torres is an 44 y.o. female.   Chief Complaint: Neck and bilateral arm pain HPI: Patient is a very pleasant 44 year old female is a progress worsening neck and predominantly in her right arm pain it radiates down the thumb and forefinger consistent with C6 and C7 nerve root pattern. Patient previously undergone a C4-5 fusion healed very well many years ago however the last several weeks and months is a progress worsening neck pain workup revealed progressive spondylitic breakdown at C5-6 and C6-7 with severe foraminal stenosis of the C6 and C7 nerve roots primarily on the right bilaterally. Due to patient's failure conservative treatment imaging findings and progression of clinical syndrome I recommended an anterior cervical discectomy and fusion at C5-6 and C6-7 with removal of hardware at C4-5. I extensively reviewed the risks and benefits of the operation as well as perioperative course expectations of outcome and alternatives of surgery and she understands and agrees to proceed forward.  Past Medical History  Diagnosis Date  . Herpes genitalis in women   . GERD (gastroesophageal reflux disease)   . Asthma     last episode january 2015  . Headache     Past Surgical History  Procedure Laterality Date  . Mandible surgery    . Esophagogastroduodenoscopy    . Cervical spine surgery      Family History  Problem Relation Age of Onset  . Arthritis Mother   . Cancer Mother   . Graves' disease Sister    Social History:  reports that she has never smoked. She has never used smokeless tobacco. She reports that she does not use illicit drugs. Her alcohol history is not on file.  Allergies:  Allergies  Allergen Reactions  . Codeine Nausea And Vomiting    Medications Prior to Admission  Medication Sig Dispense Refill  . acyclovir (ZOVIRAX) 400 MG tablet Take 400 mg by mouth every other day.    . beclomethasone (QVAR) 80 MCG/ACT inhaler Inhale 2 puffs into the lungs daily.    .  Cholecalciferol (VITAMIN D3) 2000 UNITS TABS Take 1 tablet by mouth daily.    . fexofenadine (ALLEGRA) 180 MG tablet Take 180 mg by mouth daily.    Marland Kitchen GLUCOSAMINE-CHONDROITIN PO Take 1 tablet by mouth daily.    . Levonorgestrel-Ethinyl Estradiol (CAMRESE) 0.15-0.03 &0.01 MG tablet Take 1 tablet by mouth daily.    . montelukast (SINGULAIR) 10 MG tablet Take 10 mg by mouth at bedtime.    . Multiple Vitamin (MULTIVITAMIN WITH MINERALS) TABS tablet Take 1 tablet by mouth daily.    . pantoprazole (PROTONIX) 40 MG tablet Take 40 mg by mouth every other day.      No results found for this or any previous visit (from the past 48 hour(s)). No results found.  Review of Systems  Constitutional: Negative.   Respiratory: Negative.   Cardiovascular: Negative.   Gastrointestinal: Negative.   Genitourinary: Negative.   Musculoskeletal: Positive for myalgias, joint pain and neck pain.  Skin: Negative.   Neurological: Positive for tingling, sensory change and headaches.  Psychiatric/Behavioral: Negative.     Blood pressure 122/67, pulse 83, temperature 98.7 F (37.1 C), resp. rate 18, height 5\' 2"  (1.575 m), weight 58.968 kg (130 lb), SpO2 98 %. Physical Exam  Constitutional: She is oriented to person, place, and time. She appears well-developed and well-nourished.  Eyes: Pupils are equal, round, and reactive to light.  Neck: Normal range of motion.  Cardiovascular: Normal rate.   Respiratory: Effort normal and breath  sounds normal.  GI: Soft. Bowel sounds are normal.  Neurological: She is alert and oriented to person, place, and time. She has normal strength. GCS eye subscore is 4. GCS verbal subscore is 5. GCS motor subscore is 6.  Strength is 5 out of 5 in her deltoid, bicep, tricep, wrist flexion, wrist extension, and intrinsics.  Skin: Skin is warm and dry.     Assessment/Plan 44 year old female presents for an ACDF at C5-6 and C6-7.  Sharion Grieves P 01/31/2014, 7:11 AM

## 2014-01-31 NOTE — Progress Notes (Signed)
Patients EKG is normal, Dr. Noreene Larsson at bedside new orders received. Patient states she has had the symptoms before surgery they were just more severe. Will continue to monitor

## 2014-01-31 NOTE — Anesthesia Postprocedure Evaluation (Signed)
  Anesthesia Post-op Note  Patient: Anna Torres  Procedure(s) Performed: Procedure(s): ANTERIOR CERVICAL DECOMPRESSION/DISCECTOMY FUSION CERVICAL FIVE-SIX,CERVICAL SIX-SEVEN WITH HARDWARE REMOVAL OF CODMAN PLATE AT CERVIAL FOUR-FIVE (N/A)  Patient Location: PACU  Anesthesia Type:General  Level of Consciousness: awake, alert  and oriented  Airway and Oxygen Therapy: Patient Spontanous Breathing and Patient connected to nasal cannula oxygen  Post-op Pain: mild  Post-op Assessment: Post-op Vital signs reviewed, Patient's Cardiovascular Status Stable, Respiratory Function Stable, Patent Airway and Pain level controlled  Post-op Vital Signs: stable  Last Vitals:  Filed Vitals:   01/31/14 1346  BP: 129/74  Pulse: 105  Temp: 36.8 C  Resp: 16    Complications: No apparent anesthesia complications

## 2014-01-31 NOTE — Anesthesia Preprocedure Evaluation (Addendum)
Anesthesia Evaluation  Patient identified by MRN, date of birth, ID band Patient awake    Reviewed: Allergy & Precautions, NPO status , Patient's Chart, lab work & pertinent test results  History of Anesthesia Complications Negative for: history of anesthetic complications  Airway Mallampati: III  TM Distance: <3 FB Neck ROM: Limited  Mouth opening: Limited Mouth Opening Comment: Elective video laryngoscopy planned Dental  (+) Teeth Intact, Dental Advisory Given   Pulmonary asthma ,  breath sounds clear to auscultation        Cardiovascular negative cardio ROS  Rhythm:Regular Rate:Normal     Neuro/Psych  Headaches, LUE pain, numbness, tingling, weakness negative psych ROS   GI/Hepatic Neg liver ROS, GERD-  ,  Endo/Other  negative endocrine ROS  Renal/GU negative Renal ROS     Musculoskeletal negative musculoskeletal ROS (+)   Abdominal   Peds  Hematology negative hematology ROS (+)   Anesthesia Other Findings Previous mandible surgery  Reproductive/Obstetrics negative OB ROS                           Anesthesia Physical Anesthesia Plan  ASA: II  Anesthesia Plan: General   Post-op Pain Management:    Induction: Intravenous  Airway Management Planned: Video Laryngoscope Planned and Oral ETT  Additional Equipment:   Intra-op Plan:   Post-operative Plan: Extubation in OR  Informed Consent: I have reviewed the patients History and Physical, chart, labs and discussed the procedure including the risks, benefits and alternatives for the proposed anesthesia with the patient or authorized representative who has indicated his/her understanding and acceptance.   Dental advisory given  Plan Discussed with: CRNA and Anesthesiologist  Anesthesia Plan Comments:        Anesthesia Quick Evaluation

## 2014-02-01 ENCOUNTER — Encounter (HOSPITAL_COMMUNITY): Payer: Self-pay | Admitting: Neurosurgery

## 2014-02-01 DIAGNOSIS — M4722 Other spondylosis with radiculopathy, cervical region: Secondary | ICD-10-CM | POA: Diagnosis not present

## 2014-02-01 MED ORDER — CYCLOBENZAPRINE HCL 10 MG PO TABS: 10.0000 mg | ORAL_TABLET | Freq: Three times a day (TID) | ORAL | Status: DC | PRN

## 2014-02-01 MED ORDER — OXYCODONE-ACETAMINOPHEN 5-325 MG PO TABS: 1.0000 | ORAL_TABLET | ORAL | Status: DC | PRN

## 2014-02-01 MED FILL — Thrombin For Soln 5000 Unit: CUTANEOUS | Qty: 5000 | Status: AC

## 2014-02-01 NOTE — Discharge Summary (Signed)
  Physician Discharge Summary  Patient ID: RENATA GAMBINO MRN: [id number] DOB/AGE: [birth year] 44 y.o.  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  Consults: Significant Diagnostic Studies: Treatments: ACDF C56 C6-7 Discharge Exam: Blood pressure 100/60, pulse 84, temperature 98.3 F (36.8 C), temperature source Oral, resp. rate 18, height 5\' 2"  (1.575 m), weight 58.968 kg (130 lb), SpO2 95 %. Strength 5 out of 5 wound clean dry and intact  Disposition: Home     Medication List    TAKE these medications        acyclovir 400 MG tablet  Commonly known as:  ZOVIRAX  Take 400 mg by mouth every other day.     beclomethasone 80 MCG/ACT inhaler  Commonly known as:  QVAR  Inhale 2 puffs into the lungs daily.     CAMRESE 0.15-0.03 &0.01 MG tablet  Generic drug:  Levonorgestrel-Ethinyl Estradiol  Take 1 tablet by mouth daily.     cyclobenzaprine 10 MG tablet  Commonly known as:  FLEXERIL  Take 1 tablet (10 mg total) by mouth 3 (three) times daily as needed for muscle spasms.     fexofenadine 180 MG tablet  Commonly known as:  ALLEGRA  Take 180 mg by mouth daily.     GLUCOSAMINE-CHONDROITIN PO  Take 1 tablet by mouth daily.     montelukast 10 MG tablet  Commonly known as:  SINGULAIR  Take 10 mg by mouth at bedtime.     multivitamin with minerals Tabs tablet  Take 1 tablet by mouth daily.     oxyCODONE-acetaminophen 5-325 MG per tablet  Commonly known as:  PERCOCET/ROXICET  Take 1-2 tablets by mouth every 4 (four) hours as needed for moderate pain.     pantoprazole 40 MG tablet   Commonly known as:  PROTONIX  Take 40 mg by mouth every other day.     Vitamin D3 2000 UNITS Tabs  Take 1 tablet by mouth daily.           Follow-up Information    Follow up with [facility], MD.   Specialty:  Neurosurgery   Contact information:   [address] [place] [phone]       Signed: [phone] 02/01/2014, 7:32 AM

## 2014-02-01 NOTE — Progress Notes (Signed)
Pt doing well. Pt and husband given D/C instructions with Rx's, verbal understanding was provided. Pt's incision is clean and dry with no sign of infection. Pt's IV was removed prior to D/C. Pt D/C'd home via wheelchair @1055 per MD order. Pt is stable @ D/C and has no other needs at this time. Shunsuke Granzow, RN  

## 2014-02-01 NOTE — Discharge Instructions (Signed)
No lifting no bending no twisting no driving remove the bandage in 2-3 days cover the Steri-Strips with saran wrap for showers only.    Wound Care Keep incision covered and dry for one week.  If you shower prior to then, cover incision with plastic wrap.  You may remove outer bandage after one week and shower.  Do not put any creams, lotions, or ointments on incision. Leave steri-strips on neck.  They will fall off by themselves. Activity Walk each and every day, increasing distance each day. No lifting greater than 5 lbs.  Avoid excessive neck motion. No driving for 2 weeks; may ride as a passenger locally. Wear neck brace at all times except when showering or otherwise instructed. Diet Resume your normal diet.  Return to Work Will be discussed at you follow up appointment. Call Your Doctor If Any of These Occur Redness, drainage, or swelling at the wound.  Temperature greater than 101 degrees. Severe pain not relieved by pain medication. Increased difficulty swallowing.  Incision starts to come apart. Follow Up Appt Call today for appointment in 1-2 weeks ([phone]) or for problems.  If you have any hardware placed in your spine, you will need an x-ray before your appointment.   Anterior Cervical Diskectomy and Fusion Anterior cervical diskectomy is surgery done on the upper spine to relieve pressure on one or more nerve roots, or on the spinal cord. There are 7 bones in your neck, called the cervical spine. These 7 bones (vertebrae) sit one on top of the other. Cushions (intervertebral disks) separate the vertebrae and act like shock absorbers. As we age, degeneration of our bones, joints, and disks can cause neck pain and tightening around the spinal cord and nerve roots. This causes arm pain and weakness.  Degeneration involves:  Herniated Disk. With age, the disks dry up and can rupture. In this condition, the center of the disk bulges out (disk herniation). This can cause  pressure on a nerve, which produces pain or weakness in the arm.  Bone spurs and spinal stenosis. As we age, growths often develop on our bones. These growths are called bone spurs (osteophytes). A bone spur is a collection of calcium. As bone spurs grow and extend, the vertebral openings become narrow. The spinal canal and/or the foramen (opening for nerve passageways) become smaller. This narrowing (stenosis) may cause pinching (compression) of the spinal cord or the spinal nerve root. The nerve injury can cause pain, weakness, numbness, and loss of coordination in the upper limbs. Often, patients have difficulty with their hand writing or they start dropping things, because their hand grip is weaker. The spinal cord damage can cause increased stiffness, more frequent falls, electric shooting pain, and changes in bowel and bladder control. Degeneration in the neck results in three common problems:  Radiculopathy - Nerve compression that results in weakness or pain that radiates down the arm.  Myelopathy - Spinal cord compression that causes stiffness, difficulty with walking, coordination, and trouble with bowel or bladder habits.  Neck pain - Worn out joints cause pain as the neck moves. Treatment:  Radiculopathy - Surgery is performed to remove the bony and disk material that is pushing on the nerve.  Myelopathy - Surgery is performed to remove the bony and disk material that pushes on the spinal cord.  Neck pain - Surgery is performed to combine (fuse) the joints of the neck together, so they cannot move or cause pain. Surgery can be done from the front or  the back of the neck. When it is done from the front, it is called an anterior (front) cervical (neck) diskectomy (removal of the disk) and fusion. LET YOUR CAREGIVER KNOW ABOUT:   Recent infections.  Any shooting pains down your leg, when you move your neck.  Any difficulty swallowing.  A smoking history.  Use of blood thinners or  anti-inflammatory medicines.  Any history of injury to your shoulders.  Any history of injury to your vocal cords.  Any foreign objects in your body from a previous surgery.  Any recent fevers or illness.  Past medical history (diabetes, strokes).  Past problems with anesthetics.  Possibility of pregnancy.  History of blood clots (deep vein thrombosis).  History of bleeding or blood problems.  Past surgeries.  Other health problems.  Allergies.  Medicines you take, including herbs, eye drops, over-the-counter medicines, and creams.  Use of steroids (by mouth or creams). RISKS AND COMPLICATIONS  Infection.  Bleeding.  Injury to the following structures:  Carotid artery. This can result in a stroke or significant amount of bleeding.  Esophagus, resulting in difficulty swallowing.  Recurrent laryngeal nerve, resulting in hoarseness of the voice.  Spinal cord injury, ranging from mild to complete quadriparesis (muscle weakness in all four limbs).  Nerve root injury, resulting in muscle weakness in the upper limb.  Leakage of cerebrospinal fluid. BEFORE THE PROCEDURE   You will be given medicine to help you sleep (general anesthetic), and a breathing tube will be placed.  You will be given antibiotics to keep the infection rate down.  The incision site on your neck will be marked.  Your neck will be cleaned, to reduce the risk of infection. PROCEDURE  An anterior cervical fusion means that the operation is done through the front (anterior) part of your neck. The cut made by the surgeon (incision) is usually within a skin fold line on the neck. After pushing aside the neck muscles, the surgeon removes the affected, degenerated disk and bone spurs (osteophytes), which takes the pressure off the nerves and spinal cord. This is called a decompression. The area where the disk was removed is then filled with a small piece of plastic. This plastic takes the place of the  disk and keeps the nerve passageway (foramen) open and clear for the nerves. In most cases, the surgeon uses metal plates or pins (hardware) in the neck, to help stabilize the level being fused. The hardware reduces motion at that level, so it can fuse. This provides extra support to the neck. A cervical fusion procedure takes anywhere from a couple to several hours, depending on the size of the neck, history of previous surgery, and number of levels being fused. AFTER THE PROCEDURE   You will likely spend 24-48 hours in the hospital. During this time, your caregivers will look for any signs of complications from the procedure.  Your caregiver will watch you, to make sure that fluid draining from the surgery slows down. It is important that a large mass of blood does not form in your neck, which would cause difficulty with breathing.  You will get 24 hours of antibiotics.  You can start to eat as soon as you feel comfortable.  Once you have started eating, walking, urinating (voiding) and having bowel movements on your own, your caregiver will discharge you home. HOME CARE INSTRUCTIONS   For 2 weeks, do not soak the incision site under water. Do not swim or take baths. Showers are okay, but  rinse off the incision sites.  Do not over exert yourself. Allow time for the incision to heal.  It can take from 6 weeks to 6 months for fusion to take effect. Your caregiver may ask you to wear a neck collar during this time, as they check the fusion with multiple (serial) X-rays. Document Released: 12/26/2008 Document Revised: 05/04/2012 Document Reviewed: 12/26/2008 [facility] Patient Information 2015 [place], [place]. This information is not intended to replace advice given to you by your health care provider. Make sure you discuss any questions you have with your health care provider.

## 2014-02-01 NOTE — Progress Notes (Signed)
Patient ID: Anna Torres, female   DOB: [birth year], 44 y.o.   MRN: [id number] Doing well plan discharge home

## 2014-02-14 ENCOUNTER — Encounter (HOSPITAL_COMMUNITY): Payer: Self-pay | Admitting: Gastroenterology

## 2014-02-24 ENCOUNTER — Encounter (HOSPITAL_COMMUNITY): Payer: Self-pay | Admitting: Neurosurgery

## 2014-04-12 ENCOUNTER — Encounter: Payer: Self-pay | Admitting: Internal Medicine

## 2014-04-12 ENCOUNTER — Ambulatory Visit (INDEPENDENT_AMBULATORY_CARE_PROVIDER_SITE_OTHER): Payer: 59 | Admitting: Internal Medicine

## 2014-04-12 VITALS — BP 116/64 | HR 95 | Temp 98.3°F | Wt 129.0 lb

## 2014-04-12 DIAGNOSIS — R102 Pelvic and perineal pain: Secondary | ICD-10-CM

## 2014-04-12 LAB — COMPREHENSIVE METABOLIC PANEL
ALBUMIN: 4.2 g/dL (ref 3.5–5.2)
ALK PHOS: 52 U/L (ref 39–117)
ALT: 21 U/L (ref 0–35)
AST: 20 U/L (ref 0–37)
BUN: 10 mg/dL (ref 6–23)
CHLORIDE: 104 meq/L (ref 96–112)
CO2: 28 mEq/L (ref 19–32)
Calcium: 9.9 mg/dL (ref 8.4–10.5)
Creatinine, Ser: 0.77 mg/dL (ref 0.40–1.20)
GFR: 87.08 mL/min (ref >60.00)
Glucose, Bld: 99 mg/dL (ref 70–99)
POTASSIUM: 4.2 meq/L (ref 3.5–5.1)
Sodium: 133 mEq/L — ABNORMAL LOW (ref 135–145)
Total Bilirubin: 0.5 mg/dL (ref 0.2–1.2)
Total Protein: 8 g/dL (ref 6.0–8.3)

## 2014-04-12 LAB — CBC
HEMATOCRIT: 41.6 % (ref 36.0–46.0)
Hemoglobin: 13.9 g/dL (ref 12.0–15.0)
MCHC: 33.3 g/dL (ref 30.0–36.0)
MCV: 80 fl (ref 78.0–100.0)
Platelets: 433 10*3/uL — ABNORMAL HIGH (ref 150.0–400.0)
RBC: 5.2 Mil/uL — AB (ref 3.87–5.11)
RDW: 14.2 % (ref 11.5–15.5)
WBC: 9.4 10*3/uL (ref 4.0–10.5)

## 2014-04-12 NOTE — Progress Notes (Signed)
Pre visit review using our clinic review tool, if applicable. No additional management support is needed unless otherwise documented below in the visit note. 

## 2014-04-12 NOTE — Progress Notes (Signed)
Subjective:    Patient ID: Anna Torres, female    DOB: 1970-06-03, 44 y.o.   MRN: [id number]  HPI  Pt presents to the clinic today with c/o abdominal pain. She reports this started 2 weeks ago. It is intermittent. Over the last 2-3 days, it has gotten a little better. The pain is located in the RLQ. It seems to radiate to her upper abdomen. Sometimes the pain is worse with eating but not all the  time. The pain seems worse with sneezing, laughing or coughing. She denies nausea, vomiting or diarrhea. She has been somewhat constipated but reports after her BM yesterday, her symptoms improved slightly. She reports she normal has 1 BM per day. She has not tried anything OTC. She does have a history of GERD and is taking Protonix and Zantac daily. She denies urinary or vaginal complaints. Her LMP was 2 months ago (on Camrese). She is unsure if the pain occurs with intercourse, she has not attempted this.  Review of Systems      Past Medical History  Diagnosis Date  . CIN I (cervical intraepithelial neoplasia I)   . Vitamin D deficiency   . Ureteral reflux 1975  . Asthma   . Environmental and seasonal allergies   . Herpes genitalis in women   . GERD (gastroesophageal reflux disease)   . Asthma     last episode january 2015  . Headache     Current Outpatient Prescriptions  Medication Sig Dispense Refill  . acyclovir (ZOVIRAX) 400 MG tablet Take 400 mg by mouth every other day.    . albuterol (PROVENTIL HFA;VENTOLIN HFA) 108 (90 BASE) MCG/ACT inhaler Inhale 1 puff into the lungs every 6 (six) hours as needed for wheezing or shortness of breath.    . beclomethasone (QVAR) 80 MCG/ACT inhaler Inhale 2 puffs into the lungs daily.    . Cholecalciferol (VITAMIN D3) 2000 UNITS TABS Take 1 tablet by mouth daily.    . fexofenadine (ALLEGRA) 180 MG tablet Take 180 mg by mouth daily.    Clelia Schaumann Estrad 91-Day (CAMRESE PO) Take 1 tablet by mouth daily.    . Levonorgestrel-Ethinyl  Estradiol (CAMRESE) 0.15-0.03 &0.01 MG tablet Take 1 tablet by mouth daily.    . Multiple Vitamin (MULTIVITAMIN) tablet Take 1 tablet by mouth daily.    . pantoprazole (PROTONIX) 40 MG tablet Take 40 mg by mouth every other day.    . ranitidine (ZANTAC) 300 MG tablet Take 300 mg by mouth at bedtime.     No current facility-administered medications for this visit.    Allergies  Allergen Reactions  . Codeine Nausea And Vomiting  . Hydrocodone Nausea And Vomiting  . Oxycodone Nausea And Vomiting    Family History  Problem Relation Age of Onset  . Diabetes Mother   . Rheum arthritis Mother   . Arthritis Mother   . Cancer Mother   . Graves' disease Sister     History   Social History  . Marital Status: Married    Spouse Name: N/A  . Number of Children: N/A  . Years of Education: N/A   Occupational History  . Not on file.   Social History Main Topics  . Smoking status: Never Smoker   . Smokeless tobacco: Never Used  . Alcohol Use: 0.0 oz/week    0 Standard drinks or equivalent per week     Comment: rare social  . Drug Use: No  . Sexual Activity: Yes    Birth  Control/ Protection: Pill   Other Topics Concern  . Not on file   Social History Narrative   ** Merged History Encounter **         Constitutional: Denies fever, malaise, fatigue, headache or abrupt weight changes.  Respiratory: Denies difficulty breathing, shortness of breath, cough or sputum production.   Cardiovascular: Denies chest pain, chest tightness, palpitations or swelling in the hands or feet.  Gastrointestinal: Pt reports abdominal pain and constipation. Denies bloating, diarrhea or blood in the stool.  GU: Denies urgency, frequency, pain with urination, burning sensation, blood in urine, odor or discharge.   No other specific complaints in a complete review of systems (except as listed in HPI above).  Objective:   Physical Exam   BP 116/64 mmHg  Pulse 95  Temp(Src) 98.3 F (36.8 C)  (Oral)  Wt 129 lb (58.514 kg)  SpO2 98% Wt Readings from Last 3 Encounters:  04/12/14 129 lb (58.514 kg)  05/24/13 130 lb (58.968 kg)  04/17/12 114 lb (51.71 kg)    General: Appears her stated age, well developed, well nourished in NAD. Skin: Warm, dry and intact. No rashes, lesions or ulcerations noted. Cardiovascular: Normal rate and rhythm. S1,S2 noted.  No murmur, rubs or gallops noted.  Pulmonary/Chest: Normal effort and positive vesicular breath sounds. No respiratory distress. No wheezes, rales or ronchi noted.  Abdomen: Soft and tender in the right lower pelvic area. Normal bowel sounds, no bruits noted. No distention or masses noted. Liver, spleen and kidneys non palpable. Neurological: Alert and oriented.   BMET    Component Value Date/Time   NA 139 01/24/2014 1609   K 4.6 01/24/2014 1609   CL 108 01/24/2014 1609   CO2 22 01/24/2014 1609   GLUCOSE 115* 01/24/2014 1609   BUN 10 01/24/2014 1609   CREATININE 0.78 01/24/2014 1609   CALCIUM 9.6 01/24/2014 1609   GFRNONAA >90 01/24/2014 1609   GFRAA >90 01/24/2014 1609    Lipid Panel  No results found for: CHOL, TRIG, HDL, CHOLHDL, VLDL, LDLCALC  CBC    Component Value Date/Time   WBC 10.4 01/31/2014 0638   RBC 4.79 01/31/2014 0638   HGB 12.9 01/31/2014 0638   HCT 38.0 01/31/2014 0638   PLT 358 01/31/2014 0638   MCV 79.3 01/31/2014 0638   MCH 26.9 01/31/2014 0638   MCHC 33.9 01/31/2014 0638   RDW 13.6 01/31/2014 0638    Hgb A1C No results found for: HGBA1C      Assessment & Plan:   Pelvic Pain in Female:  Will obtain CBC and CMET to r/o issue with gallbladder I think this in more of a pelvic issue though If labs normal, will obtain pelvic and transvaginal ultrasound to r/o ovarian cyst (she does reports history of uterine fibroids in the past)  Will follow up after labs, RTC as needed

## 2014-04-12 NOTE — Patient Instructions (Signed)
Pelvic Pain Female pelvic pain can be caused by many different things and start from a variety of places. Pelvic pain refers to pain that is located in the lower half of the abdomen and between your hips. The pain may occur over a short period of time (acute) or may be reoccurring (chronic). The cause of pelvic pain may be related to disorders affecting the female reproductive organs (gynecologic), but it may also be related to the bladder, kidney stones, an intestinal complication, or muscle or skeletal problems. Getting help right away for pelvic pain is important, especially if there has been severe, sharp, or a sudden onset of unusual pain. It is also important to get help right away because some types of pelvic pain can be life threatening.  CAUSES  Below are only some of the causes of pelvic pain. The causes of pelvic pain can be in one of several categories.   Gynecologic.  Pelvic inflammatory disease.  Sexually transmitted infection.  Ovarian cyst or a twisted ovarian ligament (ovarian torsion).  Uterine lining that grows outside the uterus (endometriosis).  Fibroids, cysts, or tumors.  Ovulation.  Pregnancy.  Pregnancy that occurs outside the uterus (ectopic pregnancy).  Miscarriage.  Labor.  Abruption of the placenta or ruptured uterus.  Infection.  Uterine infection (endometritis).  Bladder infection.  Diverticulitis.  Miscarriage related to a uterine infection (septic abortion).  Bladder.  Inflammation of the bladder (cystitis).  Kidney stone(s).  Gastrointestinal.  Constipation.  Diverticulitis.  Neurologic.  Trauma.  Feeling pelvic pain because of mental or emotional causes (psychosomatic).  Cancers of the bowel or pelvis. EVALUATION  Your caregiver will want to take a careful history of your concerns. This includes recent changes in your health, a careful gynecologic history of your periods (menses), and a sexual history. Obtaining your family  history and medical history is also important. Your caregiver may suggest a pelvic exam. A pelvic exam will help identify the location and severity of the pain. It also helps in the evaluation of which organ system may be involved. In order to identify the cause of the pelvic pain and be properly treated, your caregiver may order tests. These tests may include:   A pregnancy test.  Pelvic ultrasonography.  An X-ray exam of the abdomen.  A urinalysis or evaluation of vaginal discharge.  Blood tests. HOME CARE INSTRUCTIONS   Only take over-the-counter or prescription medicines for pain, discomfort, or fever as directed by your caregiver.   Rest as directed by your caregiver.   Eat a balanced diet.   Drink enough fluids to make your urine clear or pale yellow, or as directed.   Avoid sexual intercourse if it causes pain.   Apply warm or cold compresses to the lower abdomen depending on which one helps the pain.   Avoid stressful situations.   Keep a journal of your pelvic pain. Write down when it started, where the pain is located, and if there are things that seem to be associated with the pain, such as food or your menstrual cycle.  Follow up with your caregiver as directed.  SEEK MEDICAL CARE IF:  Your medicine does not help your pain.  You have abnormal vaginal discharge. SEEK IMMEDIATE MEDICAL CARE IF:   You have heavy bleeding from the vagina.   Your pelvic pain increases.   You feel light-headed or faint.   You have chills.   You have pain with urination or blood in your urine.   You have uncontrolled diarrhea   or vomiting.   You have a fever or persistent symptoms for more than 3 days.  You have a fever and your symptoms suddenly get worse.   You are being physically or sexually abused.  MAKE SURE YOU:  Understand these instructions.  Will watch your condition.  Will get help if you are not doing well or get worse. Document Released:  12/05/2003 Document Revised: 05/24/2013 Document Reviewed: 04/29/2011 [employer] Patient Information 2015 [employer]. This information is not intended to replace advice given to you by your health care provider. Make sure you discuss any questions you have with your health care provider.  

## 2014-04-13 ENCOUNTER — Other Ambulatory Visit: Payer: Self-pay | Admitting: Internal Medicine

## 2014-04-13 DIAGNOSIS — R102 Pelvic and perineal pain: Secondary | ICD-10-CM

## 2014-04-22 ENCOUNTER — Ambulatory Visit
Admit: 2014-04-22 | Disposition: A | Discharge status: Home / Self Care | Payer: Self-pay | Attending: Internal Medicine | Admitting: Internal Medicine

## 2014-04-25 ENCOUNTER — Encounter: Payer: Self-pay | Admitting: Internal Medicine

## 2014-04-26 ENCOUNTER — Ambulatory Visit (INDEPENDENT_AMBULATORY_CARE_PROVIDER_SITE_OTHER)
Admission: RE | Admit: 2014-04-26 | Discharge: 2014-04-26 | Disposition: A | Discharge status: Home / Self Care | Payer: 59 | Source: Ambulatory Visit | Attending: Family Medicine | Admitting: Family Medicine

## 2014-04-26 ENCOUNTER — Encounter: Payer: Self-pay | Admitting: Family Medicine

## 2014-04-26 ENCOUNTER — Ambulatory Visit (INDEPENDENT_AMBULATORY_CARE_PROVIDER_SITE_OTHER): Payer: 59 | Admitting: Family Medicine

## 2014-04-26 VITALS — BP 104/62 | HR 78 | Temp 97.9°F | Resp 18 | Wt 133.0 lb

## 2014-04-26 DIAGNOSIS — M5023 Other cervical disc displacement, cervicothoracic region: Secondary | ICD-10-CM

## 2014-04-26 DIAGNOSIS — M25551 Pain in right hip: Secondary | ICD-10-CM

## 2014-04-26 DIAGNOSIS — J45909 Unspecified asthma, uncomplicated: Secondary | ICD-10-CM

## 2014-04-26 DIAGNOSIS — M47812 Spondylosis without myelopathy or radiculopathy, cervical region: Secondary | ICD-10-CM

## 2014-04-26 DIAGNOSIS — R1031 Right lower quadrant pain: Secondary | ICD-10-CM | POA: Diagnosis not present

## 2014-04-26 NOTE — Assessment & Plan Note (Signed)
New- persistent with associated hip pain. ? Hip arthritis radiating to lower abdomen/groin. 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 Exam reassuring but I do feel this needs to be worked up further. I did suggest either a CT of abd/pelvis or xray of right hip-- would like still need CT but she prefers to start with xray today.

## 2014-04-26 NOTE — Patient Instructions (Addendum)
It was nice to meet you. We will call you with your xray results.

## 2014-04-26 NOTE — Progress Notes (Signed)
Subjective:   Patient ID: Anna Torres, female    DOB: 1970/08/06, 45 y.o.   MRN: [id number]  Maxene Byington is a pleasant 44 y.o. year old female pt, new to me, who presents to clinic today with Establish Care and Abdominal Pain  on 04/26/2014  HPI:  Rosendo Gros on 3/22 for abdominal/pelvic pain. Note reviewed.  At that time, pain was occuring for 2 weeks- RLQ with radiation to epigastric region.   No associated nausea, vomiting or changes in bowel habits. No fever. No urinary symptoms.  No blood in stool.  She did have some constipation at that time.  CMET and CBC unremarkable. Transvaginal and pelvic US.   Pain continues- some days she does not have pain, other days- she has to stay home because pain is so severe. She has noticed that she usually has the pain when she wakes up which improves a little after she empties her bladder. Pain can last minutes to hours.  Does notice if she stomach is full, she can also feel the pain.  Right hip sometimes hurts when she has this pain.   Lab Results  Component Value Date   WBC 9.4 04/12/2014   HGB 13.9 04/12/2014   HCT 41.6 04/12/2014   MCV 80.0 04/12/2014   PLT 433.0* 04/12/2014   Lab Results  Component Value Date   ALT 21 04/12/2014   AST 20 04/12/2014   ALKPHOS 52 04/12/2014   BILITOT 0.5 04/12/2014   Lab Results  Component Value Date   NA 133* 04/12/2014   K 4.2 04/12/2014   CL 104 04/12/2014   CO2 28 04/12/2014   Lab Results  Component Value Date   CREATININE 0.77 04/12/2014    Seasonal allergies/asthma- followed by "an asthma doctor."  Feels symptoms currently well controlled on current rx.  Recent cervical surgery- needs new referral to her neurosurgeon, Dr. Wynetta Emery.  Review of Systems  Constitutional: Negative.   HENT: Negative.   Eyes: Negative.   Respiratory: Negative.   Gastrointestinal: Positive for abdominal pain. Negative for nausea, vomiting, diarrhea, constipation, blood in  stool, abdominal distention, anal bleeding and rectal pain.  Endocrine: Negative.   Genitourinary: Negative.   Musculoskeletal: Negative for gait problem.  Skin: Negative.   Neurological: Negative.   Hematological: Negative.   Psychiatric/Behavioral: Negative.   All other systems reviewed and are negative.      Objective:    BP 104/62 mmHg  Pulse 78  Temp(Src) 97.9 F (36.6 C) (Oral)  Resp 18  Wt 133 lb (60.328 kg)  SpO2 96%  LMP 02/07/2014   Physical Exam  Constitutional: She is oriented to person, place, and time. She appears well-developed and well-nourished. No distress.  HENT:  Head: Normocephalic.  Eyes: Conjunctivae are normal.  Cardiovascular: Normal rate.   Pulmonary/Chest: Effort normal.  Abdominal: Soft. Bowel sounds are normal. She exhibits no distension and no mass. There is tenderness. There is no rebound and no guarding.  Mild TTP bilateral lower quadrants  Musculoskeletal:       Right hip: She exhibits tenderness. She exhibits normal range of motion, normal strength, no bony tenderness, no swelling, no crepitus, no deformity and no laceration.  Neurological: She is alert and oriented to person, place, and time. No cranial nerve deficit.  Skin: Skin is warm and dry.  Psychiatric: She has a normal mood and affect. Her behavior is normal. Judgment and thought content normal.  Nursing note and vitals reviewed.  Assessment & Plan:   RLQ abdominal pain  Asthma, chronic, unspecified asthma severity, uncomplicated  Right hip pain No Follow-up on file.

## 2014-04-29 ENCOUNTER — Telehealth: Payer: Self-pay | Admitting: Family Medicine

## 2014-04-29 NOTE — Telephone Encounter (Signed)
Pt returned your call. Please return call on cell. Thanks.

## 2014-04-29 NOTE — Telephone Encounter (Signed)
I did not call this pt, and there is nothing in her chart indicating someone did.

## 2014-05-31 ENCOUNTER — Ambulatory Visit (INDEPENDENT_AMBULATORY_CARE_PROVIDER_SITE_OTHER): Payer: 59 | Admitting: Family Medicine

## 2014-05-31 ENCOUNTER — Encounter: Payer: Self-pay | Admitting: Family Medicine

## 2014-05-31 VITALS — BP 128/80 | HR 107 | Temp 97.9°F | Wt 134.0 lb

## 2014-05-31 DIAGNOSIS — M25512 Pain in left shoulder: Secondary | ICD-10-CM | POA: Insufficient documentation

## 2014-05-31 DIAGNOSIS — R519 Headache, unspecified: Secondary | ICD-10-CM | POA: Insufficient documentation

## 2014-05-31 DIAGNOSIS — R51 Headache: Secondary | ICD-10-CM

## 2014-05-31 DIAGNOSIS — G8929 Other chronic pain: Secondary | ICD-10-CM | POA: Insufficient documentation

## 2014-05-31 NOTE — Assessment & Plan Note (Signed)
>  25 minutes spent in face to face time with patient, >50% spent in counselling or coordination of care Complicated, chronic issue that is likely multifactorial although she does have signs of rotator cuff impingement on exam. After our discussion today, we agreed that she needs to see ortho( she declined PT that I suggested). Refer back to Dr. August Saucer for further evaluation and management. The patient indicates understanding of these issues and agrees with the plan.

## 2014-05-31 NOTE — Patient Instructions (Signed)
Good to see you. Please stop by to see Anna Torres on your way out. 

## 2014-05-31 NOTE — Progress Notes (Signed)
Subjective:   Patient ID: Anna Torres, female    DOB: 1970-04-06, 44 y.o.   MRN: [id number]  Anna Torres is a pleasant 44 y.o. year old female who presents to clinic today with Shoulder Pain and Headache  on 05/31/2014  HPI: Complicated history- I have seen her once when she established with me in 04/2014.   Multiple complaints today- s/p anterior cervical decompression and fusion by Dr. Wynetta Emery in 01/2014. Since the surgery (and perhaps before), has been having intermittent left shoulder pain that often radiates to her neck and down her arm and into her left 4th finger. Per pt, she told Dr. Wynetta Emery and was told that it is not coming from her neck.  Also having more headaches.  They "move around," sometimes behind one eye, but often in back of her head- mid line. Sometimes feels a little sensitive to light but "not bad."  No nausea, vomiting or other focal neurological issues. Has not tried much other than massage therapy for them.  Current Outpatient Prescriptions on File Prior to Visit  Medication Sig Dispense Refill  . acyclovir (ZOVIRAX) 400 MG tablet Take 400 mg by mouth every other day.    . albuterol (PROVENTIL HFA;VENTOLIN HFA) 108 (90 BASE) MCG/ACT inhaler Inhale 1 puff into the lungs every 6 (six) hours as needed for wheezing or shortness of breath.    . beclomethasone (QVAR) 80 MCG/ACT inhaler Inhale 1 puff into the lungs 2 (two) times daily.    . Cholecalciferol (VITAMIN D3) 2000 UNITS TABS Take 1 tablet by mouth daily.    . fexofenadine (ALLEGRA) 180 MG tablet Take 180 mg by mouth daily.    Marland Kitchen glucosamine-chondroitin 500-400 MG tablet Take 1 tablet by mouth daily.    Clelia Schaumann Estrad 91-Day (CAMRESE PO) Take 1 tablet by mouth daily.    . Levonorgestrel-Ethinyl Estradiol (CAMRESE) 0.15-0.03 &0.01 MG tablet Take 1 tablet by mouth daily.    . Multiple Vitamin (MULTIVITAMIN) tablet Take 1 tablet by mouth daily.    . pantoprazole (PROTONIX) 40 MG tablet Take 40 mg  by mouth every other day.    . ranitidine (ZANTAC) 300 MG tablet Take 300 mg by mouth at bedtime.     No current facility-administered medications on file prior to visit.    Allergies  Allergen Reactions  . Codeine Nausea And Vomiting  . Hydrocodone Nausea And Vomiting  . Oxycodone Nausea And Vomiting    Past Medical History  Diagnosis Date  . CIN I (cervical intraepithelial neoplasia I)   . Vitamin D deficiency   . Ureteral reflux 1975  . Asthma   . Environmental and seasonal allergies   . Herpes genitalis in women   . GERD (gastroesophageal reflux disease)   . Asthma     last episode january 2015  . Headache     Past Surgical History  Procedure Laterality Date  . Mandible fracture surgery  1990    retained hardware "wires"  . External ear surgery      bilateral "pinning" age 15  . Dilation and curettage of uterus    . Dilatation & curettage/hysteroscopy with trueclear      3'14-endometrial polyp-[facility]  . Back surgery Bilateral 2001    C4-C5 DISK "fusion"  . Esophageal manometry N/A 05/24/2013    Procedure: ESOPHAGEAL MANOMETRY (EM);  Surgeon: Willis Modena, MD;  Location: [facility];  Service: Endoscopy;  Laterality: N/A;  . Esophagogastroduodenoscopy (egd) with propofol N/A 05/24/2013    Procedure: ESOPHAGOGASTRODUODENOSCOPY (EGD)  WITH PROPOFOL;  Surgeon: Willis Modena, MD;  Location: [facility];  Service: Endoscopy;  Laterality: N/A;  . Mandible surgery    . Esophagogastroduodenoscopy    . Cervical spine surgery    . Anterior cervical decomp/discectomy fusion N/A 01/31/2014    Procedure: ANTERIOR CERVICAL DECOMPRESSION/DISCECTOMY FUSION CERVICAL FIVE-SIX,CERVICAL SIX-SEVEN WITH HARDWARE REMOVAL OF CODMAN PLATE AT CERVIAL FOUR-FIVE;  Surgeon: Mariam Dollar, MD;  [facility];  Service: Neurosurgery;  Laterality: N/A;    Family History  Problem Relation Age of Onset  . Diabetes Mother   . Rheum arthritis Mother   . Arthritis Mother   . Cancer Mother   .  Graves' disease Sister     History   Social History  . Marital Status: Married    Spouse Name: N/A  . Number of Children: N/A  . Years of Education: N/A   Occupational History  . Not on file.   Social History Main Topics  . Smoking status: Never Smoker   . Smokeless tobacco: Never Used  . Alcohol Use: 0.0 oz/week    0 Standard drinks or equivalent per week     Comment: rare social  . Drug Use: No  . Sexual Activity: Yes    Birth Control/ Protection: Pill   Other Topics Concern  . Not on file   Social History Narrative   ** Merged History Encounter **       The PMH, PSH, Social History, Family History, Medications, and allergies have been reviewed in [facility], and have been updated if relevant.  Review of Systems  Constitutional: Positive for fatigue. Negative for fever.  HENT: Negative.   Eyes: Negative.   Respiratory: Negative.   Cardiovascular: Negative.   Gastrointestinal: Negative.   Genitourinary: Negative.   Musculoskeletal: Positive for arthralgias, neck pain and neck stiffness.  Skin: Negative.   Neurological: Negative.   Hematological: Negative.   All other systems reviewed and are negative.      Objective:    BP 128/80 mmHg  Pulse 107  Temp(Src) 97.9 F (36.6 C) (Oral)  Wt 134 lb (60.782 kg)  SpO2 96%  LMP 05/27/2014   Physical Exam  Constitutional: She is oriented to person, place, and time. She appears well-developed and well-nourished. No distress.  HENT:  Head: Normocephalic.  Eyes: Conjunctivae are normal.  Cardiovascular: Normal rate.   Pulmonary/Chest: Effort normal.  Musculoskeletal:       Left shoulder: She exhibits decreased range of motion, tenderness and pain. She exhibits no swelling, no effusion, no crepitus and no spasm.       Cervical back: She exhibits normal range of motion, no tenderness, no bony tenderness, no deformity, no pain and no spasm.       Arms: Neurological: She is alert and oriented to person, place, and time.  No cranial nerve deficit.  Skin: Skin is warm and dry.  Psychiatric: She has a normal mood and affect. Her behavior is normal. Judgment and thought content normal.  Nursing note and vitals reviewed.         Assessment & Plan:   Left shoulder pain - Plan: Ambulatory referral to Orthopedic Surgery  Chronic nonintractable headache, unspecified headache type No Follow-up on file.

## 2014-05-31 NOTE — Progress Notes (Signed)
Pre visit review using our clinic review tool, if applicable. No additional management support is needed unless otherwise documented below in the visit note. 

## 2014-07-12 ENCOUNTER — Telehealth: Payer: Self-pay

## 2014-07-12 NOTE — Telephone Encounter (Signed)
Does not need a pap before refilling- ok to refill but she needs to set up a pap within the next 6- 12 months.

## 2014-07-12 NOTE — Telephone Encounter (Signed)
Pt left v/m; pt requesting refill Camrese the BC pill that pt has menstrual period every 3 months. Pt has enough med to last until first of August. Pt established with Dr Dayton Martes on 04/26/2014 and pt wants to know if will need pap smear before can get refill of BC pill. Pt request cb.[facility].

## 2014-07-13 NOTE — Telephone Encounter (Signed)
Lm on pts vm and advised per Dr Dayton Martes. Advised pt to contact office when BCP is due for refill

## 2014-08-17 ENCOUNTER — Other Ambulatory Visit: Payer: Self-pay | Admitting: *Deleted

## 2014-08-17 MED ORDER — LEVONORGEST-ETH ESTRAD 91-DAY 0.15-0.03 &0.01 MG PO TABS: 1.0000 | ORAL_TABLET | Freq: Every day | ORAL | Status: DC

## 2014-08-17 MED ORDER — ACYCLOVIR 400 MG PO TABS: 400.0000 mg | ORAL_TABLET | ORAL | Status: DC

## 2014-08-17 NOTE — Telephone Encounter (Signed)
Pt states she has CPX scheduled in 12/2014

## 2014-08-25 ENCOUNTER — Encounter: Payer: Self-pay | Admitting: Family Medicine

## 2014-08-25 ENCOUNTER — Ambulatory Visit (INDEPENDENT_AMBULATORY_CARE_PROVIDER_SITE_OTHER): Payer: 59 | Admitting: Family Medicine

## 2014-08-25 VITALS — BP 120/72 | HR 104 | Temp 98.3°F | Wt 135.5 lb

## 2014-08-25 DIAGNOSIS — R51 Headache: Secondary | ICD-10-CM

## 2014-08-25 DIAGNOSIS — R519 Headache, unspecified: Secondary | ICD-10-CM | POA: Insufficient documentation

## 2014-08-25 DIAGNOSIS — G4452 New daily persistent headache (NDPH): Secondary | ICD-10-CM | POA: Diagnosis not present

## 2014-08-25 MED ORDER — SUMATRIPTAN SUCCINATE 25 MG PO TABS: ORAL_TABLET | ORAL | Status: DC

## 2014-08-25 MED ORDER — KETOROLAC TROMETHAMINE 60 MG/2ML IM SOLN
60.0000 mg | Freq: Once | INTRAMUSCULAR | Status: AC
  Administered 2014-08-25: 60 mg via INTRAMUSCULAR

## 2014-08-25 NOTE — Addendum Note (Signed)
Addended by: Desmond Dike on: 08/25/2014 09:38 AM   Modules accepted: Orders

## 2014-08-25 NOTE — Progress Notes (Signed)
Subjective:   Patient ID: Anna Torres, female    DOB: Jun 23, 1970, 44 y.o.   MRN: [id number]  Anna Torres is a pleasant 44 y.o. year old female who presents to clinic today with her husband Headache and Dizziness  on 08/25/2014  HPI:  Past 11 days, intermittent headaches. Have been mainly right sided.  Associated with photo and phonophobia.  No nausea or vomiting. Ibuprofen helps a little along with sleep but headache usually returns.  Tylenol does not seem to help.  She is not sure what the trigger is.  Allergist gave her an rx which just made her sleepy.  Cyroheptadine. Also felt it was not very helpful.  Has had some dizziness and neck pain but not with every headache.  Has had tension headaches in past but never anything like this before.  No slurred speech.  No focal weakness.  Allergist referred her to neurology but was told she needed referral from PCP.  Lab Results  Component Value Date   WBC 9.4 04/12/2014   HGB 13.9 04/12/2014   HCT 41.6 04/12/2014   MCV 80.0 04/12/2014   PLT 433.0* 04/12/2014   Lab Results  Component Value Date   NA 133* 04/12/2014   K 4.2 04/12/2014   CL 104 04/12/2014   CO2 28 04/12/2014   Lab Results  Component Value Date   TSH 1.320 03/23/2012     Current Outpatient Prescriptions on File Prior to Visit  Medication Sig Dispense Refill  . acyclovir (ZOVIRAX) 400 MG tablet Take 1 tablet (400 mg total) by mouth every other day. 30 tablet 1  . albuterol (PROVENTIL HFA;VENTOLIN HFA) 108 (90 BASE) MCG/ACT inhaler Inhale 1 puff into the lungs every 6 (six) hours as needed for wheezing or shortness of breath.    . beclomethasone (QVAR) 80 MCG/ACT inhaler Inhale 1 puff into the lungs 2 (two) times daily.    . Cholecalciferol (VITAMIN D3) 2000 UNITS TABS Take 1 tablet by mouth daily.    . fexofenadine (ALLEGRA) 180 MG tablet Take 180 mg by mouth daily.    Marland Kitchen glucosamine-chondroitin 500-400 MG tablet Take 1 tablet by mouth daily.     . Levonorgestrel-Ethinyl Estradiol (CAMRESE) 0.15-0.03 &0.01 MG tablet Take 1 tablet by mouth daily. 1 Package 3  . pantoprazole (PROTONIX) 40 MG tablet Take 40 mg by mouth daily.     . ranitidine (ZANTAC) 300 MG tablet Take 300 mg by mouth at bedtime.     No current facility-administered medications on file prior to visit.    Allergies  Allergen Reactions  . Codeine Nausea And Vomiting  . Hydrocodone Nausea And Vomiting  . Oxycodone Nausea And Vomiting    Past Medical History  Diagnosis Date  . CIN I (cervical intraepithelial neoplasia I)   . Vitamin D deficiency   . Ureteral reflux 1975  . Asthma   . Environmental and seasonal allergies   . Herpes genitalis in women   . GERD (gastroesophageal reflux disease)   . Asthma     last episode january 2015  . Headache     Past Surgical History  Procedure Laterality Date  . Mandible fracture surgery  1990    retained hardware "wires"  . External ear surgery      bilateral "pinning" age 58  . Dilation and curettage of uterus    . Dilatation & curettage/hysteroscopy with trueclear      3'14-endometrial polyp-[facility]  . Back surgery Bilateral 2001    C4-C5 DISK "fusion"  .  Esophageal manometry N/A 05/24/2013    Procedure: ESOPHAGEAL MANOMETRY (EM);  Surgeon: Willis Modena, MD;  Location: [facility];  Service: Endoscopy;  Laterality: N/A;  . Esophagogastroduodenoscopy (egd) with propofol N/A 05/24/2013    Procedure: ESOPHAGOGASTRODUODENOSCOPY (EGD) WITH PROPOFOL;  Surgeon: Willis Modena, MD;  Location: [facility];  Service: Endoscopy;  Laterality: N/A;  . Mandible surgery    . Esophagogastroduodenoscopy    . Cervical spine surgery    . Anterior cervical decomp/discectomy fusion N/A 01/31/2014    Procedure: ANTERIOR CERVICAL DECOMPRESSION/DISCECTOMY FUSION CERVICAL FIVE-SIX,CERVICAL SIX-SEVEN WITH HARDWARE REMOVAL OF CODMAN PLATE AT CERVIAL FOUR-FIVE;  Surgeon: Mariam Dollar, MD;  Location: [facility] OR;  Service: Neurosurgery;   Laterality: N/A;    Family History  Problem Relation Age of Onset  . Diabetes Mother   . Rheum arthritis Mother   . Arthritis Mother   . Cancer Mother   . Graves' disease Sister     History   Social History  . Marital Status: Married    Spouse Name: N/A  . Number of Children: N/A  . Years of Education: N/A   Occupational History  . Not on file.   Social History Main Topics  . Smoking status: Never Smoker   . Smokeless tobacco: Never Used  . Alcohol Use: 0.0 oz/week    0 Standard drinks or equivalent per week     Comment: rare social  . Drug Use: No  . Sexual Activity: Yes    Birth Control/ Protection: Pill   Other Topics Concern  . Not on file   Social History Narrative   ** Merged History Encounter **       The PMH, PSH, Social History, Family History, Medications, and allergies have been reviewed in [facility], and have been updated if relevant.    Review of Systems  Constitutional: Negative.   Eyes: Positive for photophobia. Negative for visual disturbance.  Cardiovascular: Negative.   Gastrointestinal: Negative.   Endocrine: Negative.   Musculoskeletal: Negative.   Skin: Negative.   Allergic/Immunologic: Negative.   Neurological: Positive for light-headedness and headaches.  Hematological: Negative.   Psychiatric/Behavioral: Negative.   All other systems reviewed and are negative.      Objective:    BP 120/72 mmHg  Pulse 104  Temp(Src) 98.3 F (36.8 C) (Oral)  Wt 135 lb 8 oz (61.462 kg)  SpO2 98%   Physical Exam  Constitutional: She is oriented to person, place, and time. She appears well-developed and well-nourished. No distress.  HENT:  Head: Normocephalic.  Eyes: Conjunctivae are normal.  Neck: Normal range of motion.  Cardiovascular: Normal rate.   Pulmonary/Chest: Effort normal.  Musculoskeletal: Normal range of motion.  Neurological: She is alert and oriented to person, place, and time. No cranial nerve deficit. Coordination normal.    Skin: Skin is warm and dry.  Psychiatric: She has a normal mood and affect. Her behavior is normal. Judgment and thought content normal.  Nursing note and vitals reviewed.         Assessment & Plan:   New daily persistent headache - Plan: Ambulatory referral to Neurology No Follow-up on file.

## 2014-08-25 NOTE — Progress Notes (Signed)
Pre visit review using our clinic review tool, if applicable. No additional management support is needed unless otherwise documented below in the visit note. 

## 2014-08-25 NOTE — Assessment & Plan Note (Signed)
New- IM toradol today, given rx for imitrex to take prn migraine but explained this will cause sedation and is not meant to be over used- see AVS. Keep a headache journal, refer to neurology.

## 2014-08-25 NOTE — Patient Instructions (Addendum)
Great to see you. Hang in there. Please stop by to see Anna Torres on your way out. Please keep a headache journal.  Imitrex as needed for migraine- this will make you sleepy and do not take more than two doses within 24 hours- it can cause a rebound headache.

## 2014-08-26 ENCOUNTER — Telehealth: Payer: Self-pay | Admitting: Family Medicine

## 2014-08-26 NOTE — Telephone Encounter (Signed)
TELEPHONE ADVICE RECORD [facility]  Patient Name: Anna Torres  DOB: 30-Nov-1970    Initial Comment Caller states she was given migraine Rx and took the first dose. Heart palpitations, chest pressure , and shaky.   Nurse Assessment  Nurse: Odis Luster, RN, Bjorn Loser Date/Time (Eastern Time): 08/26/2014 3:23:30 PM  Confirm and document reason for call. If symptomatic, describe symptoms. ---Caller states she was given migraine Rx and took the first dose. Heart palpitations, chest pressure , and shaky. Reports that she feels as though someone is sitting on her chest. Reports that she has a lot of pressure, reports that she is SOB, and her heart is racing. Reports that she took Sumatriptan 25mg .  Has the patient traveled out of the country within the last 30 days? ---Not Applicable  Does the patient require triage? ---Yes  Related visit to physician within the last 2 weeks? ---Yes  Does the PT have any chronic conditions? (i.e. diabetes, asthma, etc.) ---Yes  List chronic conditions. ---migraines  Did the patient indicate they were pregnant? ---No     Guidelines    Guideline Title Affirmed Question Affirmed Notes  Chest Pain [1] Chest pain lasts > 5 minutes AND [2] described as crushing, pressure-like, or heavy    Final Disposition User   Call EMS 911 Now , RN, Odis Luster    Comments  As caller was being triaged, she reports that her chest pressure is lessening, and she denies SOB. Refuses to go to ED as per initial outcome, MD office backline and called for further assistance (spoke with MD's nurse) to see if MD would like to see patient today or have pt to proceed to ED/UCC. Spoke with Dr. Bjorn Loser, who reports that this is a common side effect of this med, do not take anymore of the med, since the SOB has subsided, may take 2 hours to get out of her system. Caller should lie down and relax, and if the symptoms worsen or SOB increases to go to ED. Call MD office to give update in 45  mins-1 hour. Never take this meds again. Caller reports understanding and states that she is lying down now and beginning to feel much better. She agreed to call back within 1 hour to give a verbal update.   Disagree/Comply: Disagree  Disagree/Comply Reason: Disagree with instructions

## 2014-08-26 NOTE — Telephone Encounter (Signed)
Please check in with her to make sure she is feeling better -thanks

## 2014-08-26 NOTE — Telephone Encounter (Signed)
Pt states she is feeling a lot better; pt said she never had heart palpitations and the chest pressure is much better and no more SOB or shakiness.

## 2014-08-26 NOTE — Telephone Encounter (Signed)
Added sumatriptan to pts allergy and adverse reaction list.

## 2014-08-26 NOTE — Telephone Encounter (Signed)
Great-that is reassuring  Will cc to pcp

## 2014-08-26 NOTE — Telephone Encounter (Signed)
Pt called back and spoke with Artelia Laroche, See Rena note

## 2014-08-31 ENCOUNTER — Other Ambulatory Visit: Payer: Self-pay | Admitting: Specialist

## 2014-08-31 DIAGNOSIS — R519 Headache, unspecified: Secondary | ICD-10-CM

## 2014-08-31 DIAGNOSIS — R51 Headache: Principal | ICD-10-CM

## 2014-09-06 ENCOUNTER — Ambulatory Visit
Admission: RE | Admit: 2014-09-06 | Discharge: 2014-09-06 | Disposition: A | Discharge status: Home / Self Care | Payer: 59 | Source: Ambulatory Visit | Attending: Specialist | Admitting: Specialist

## 2014-09-06 DIAGNOSIS — R519 Headache, unspecified: Secondary | ICD-10-CM

## 2014-09-06 DIAGNOSIS — R51 Headache: Principal | ICD-10-CM

## 2014-11-07 ENCOUNTER — Ambulatory Visit (INDEPENDENT_AMBULATORY_CARE_PROVIDER_SITE_OTHER): Payer: 59 | Admitting: Family Medicine

## 2014-11-07 ENCOUNTER — Encounter: Payer: Self-pay | Admitting: Family Medicine

## 2014-11-07 VITALS — BP 118/70 | HR 88 | Temp 98.1°F | Wt 133.0 lb

## 2014-11-07 DIAGNOSIS — Z23 Encounter for immunization: Secondary | ICD-10-CM

## 2014-11-07 DIAGNOSIS — M79672 Pain in left foot: Secondary | ICD-10-CM | POA: Insufficient documentation

## 2014-11-07 NOTE — Progress Notes (Signed)
Subjective:   Patient ID: Anna Torres, female    DOB: 03-06-1970, 44 y.o.   MRN: [id number]  Anna Torres is a pleasant 44 y.o. year old female who presents to clinic today with Ankle Pain  on 11/07/2014  HPI:  Left heel pain- intermittent for past several months, getting progressively worse. First few steps of the day or after she has been seated for a while are the worse.  Pain can radiate to bottom of midfoot. No known injury.  Current Outpatient Prescriptions on File Prior to Visit  Medication Sig Dispense Refill  . acyclovir (ZOVIRAX) 400 MG tablet Take 1 tablet (400 mg total) by mouth every other day. 30 tablet 1  . albuterol (PROVENTIL HFA;VENTOLIN HFA) 108 (90 BASE) MCG/ACT inhaler Inhale 1 puff into the lungs every 6 (six) hours as needed for wheezing or shortness of breath.    . beclomethasone (QVAR) 80 MCG/ACT inhaler Inhale 1 puff into the lungs 2 (two) times daily.    . Cholecalciferol (VITAMIN D3) 2000 UNITS TABS Take 1 tablet by mouth daily.    . fexofenadine (ALLEGRA) 180 MG tablet Take 180 mg by mouth daily.    Marland Kitchen glucosamine-chondroitin 500-400 MG tablet Take 1 tablet by mouth daily.    . Levonorgestrel-Ethinyl Estradiol (CAMRESE) 0.15-0.03 &0.01 MG tablet Take 1 tablet by mouth daily. 1 Package 3  . pantoprazole (PROTONIX) 40 MG tablet Take 40 mg by mouth daily.     . ranitidine (ZANTAC) 300 MG tablet Take 300 mg by mouth at bedtime.    . SUMAtriptan (IMITREX) 25 MG tablet I tab by mouth as needed for a migraine May repeat in 2 hours ONE TIME ONLY if headache persists or recurs. 10 tablet 0   No current facility-administered medications on file prior to visit.    Allergies  Allergen Reactions  . Codeine Nausea And Vomiting  . Hydrocodone Nausea And Vomiting  . Oxycodone Nausea And Vomiting  . Sumatriptan Other (See Comments)    Heart palpitations,SOB, chest pressure and shakiness    Past Medical History  Diagnosis Date  . CIN I (cervical  intraepithelial neoplasia I)   . Vitamin D deficiency   . Ureteral reflux 1975  . Asthma   . Environmental and seasonal allergies   . Herpes genitalis in women   . GERD (gastroesophageal reflux disease)   . Asthma     last episode january 2015  . Headache     Past Surgical History  Procedure Laterality Date  . Mandible fracture surgery  1990    retained hardware "wires"  . External ear surgery      bilateral "pinning" age 48  . Dilation and curettage of uterus    . Dilatation & curettage/hysteroscopy with trueclear      3'14-endometrial polyp-Wom[facility]  . Back surgery Bilateral 2001    C4-C5 DISK "fusion"  . Esophageal manometry N/A 05/24/2013    Procedure: ESOPHAGEAL MANOMETRY (EM);  Surgeon: Willis Modena, MD;  Location: [facility];  Service: Endoscopy;  Laterality: N/A;  . Esophagogastroduodenoscopy (egd) with propofol N/A 05/24/2013    Procedure: ESOPHAGOGASTRODUODENOSCOPY (EGD) WITH PROPOFOL;  Surgeon: Willis Modena, MD;  Location: [facility];  Service: Endoscopy;  Laterality: N/A;  . Mandible surgery    . Esophagogastroduodenoscopy    . Cervical spine surgery    . Anterior cervical decomp/discectomy fusion N/A 01/31/2014    Procedure: ANTERIOR CERVICAL DECOMPRESSION/DISCECTOMY FUSION CERVICAL FIVE-SIX,CERVICAL SIX-SEVEN WITH HARDWARE REMOVAL OF CODMAN PLATE AT CERVIAL FOUR-FIVE;  Surgeon: Jillyn Hidden  Alease Medina, MD;  Location: [facility];  Service: Neurosurgery;  Laterality: N/A;    Family History  Problem Relation Age of Onset  . Diabetes Mother   . Rheum arthritis Mother   . Arthritis Mother   . Cancer Mother   . Graves' disease Sister     Social History   Social History  . Marital Status: Married    Spouse Name: N/A  . Number of Children: N/A  . Years of Education: N/A   Occupational History  . Not on file.   Social History Main Topics  . Smoking status: Never Smoker   . Smokeless tobacco: Never Used  . Alcohol Use: 0.0 oz/week    0 Standard drinks or equivalent per  week     Comment: rare social  . Drug Use: No  . Sexual Activity: Yes    Birth Control/ Protection: Pill   Other Topics Concern  . Not on file   Social History Narrative   ** Merged History Encounter **       The PMH, PSH, Social History, Family History, Medications, and allergies have been reviewed in [facility], and have been updated if relevant.   Review of Systems  Constitutional: Negative.   Musculoskeletal: Negative for gait problem.  Skin: Negative.   Psychiatric/Behavioral: Negative.   All other systems reviewed and are negative.      Objective:    BP 118/70 mmHg  Pulse 88  Temp(Src) 98.1 F (36.7 C) (Oral)  Wt 133 lb (60.328 kg)  SpO2 97%   Physical Exam  Constitutional: She is oriented to person, place, and time. She appears well-nourished.  HENT:  Head: Normocephalic.  Eyes: Conjunctivae are normal.  Cardiovascular: Normal rate.   Pulmonary/Chest: Effort normal.  Musculoskeletal:       Feet:  Neurological: She is alert and oriented to person, place, and time. No cranial nerve deficit.  Skin: Skin is warm and dry.  Psychiatric: She has a normal mood and affect. Her behavior is normal. Judgment and thought content normal.  Nursing note and vitals reviewed.         Assessment & Plan:   Need for influenza vaccination - Plan: Flu Vaccine QUAD 36+ mos PF IM (Fluarix & Fluzone Quad PF)  Pain of left heel No Follow-up on file.

## 2014-11-07 NOTE — Progress Notes (Signed)
Pre visit review using our clinic review tool, if applicable. No additional management support is needed unless otherwise documented below in the visit note. 

## 2014-11-07 NOTE — Assessment & Plan Note (Signed)
New- consistent with plantar fascitis- given handout from sports med advisor discussing plantar fascitis symptoms, diagnosis and treatment- including home exercises. Also advised Ibuprofen 800 mg three times daily with food for the next 1-2 weeks. Call or return to clinic prn if these symptoms worsen or fail to improve as anticipated. The patient indicates understanding of these issues and agrees with the plan.

## 2014-12-21 ENCOUNTER — Other Ambulatory Visit (HOSPITAL_COMMUNITY)
Admission: RE | Admit: 2014-12-21 | Discharge: 2014-12-21 | Disposition: A | Discharge status: Home / Self Care | Payer: 59 | Source: Ambulatory Visit | Attending: Family Medicine | Admitting: Family Medicine

## 2014-12-21 ENCOUNTER — Encounter: Payer: Self-pay | Admitting: Family Medicine

## 2014-12-21 ENCOUNTER — Ambulatory Visit (INDEPENDENT_AMBULATORY_CARE_PROVIDER_SITE_OTHER): Payer: 59 | Admitting: Family Medicine

## 2014-12-21 VITALS — BP 102/68 | HR 115 | Temp 98.0°F | Ht 61.75 in | Wt 136.8 lb

## 2014-12-21 DIAGNOSIS — Z1151 Encounter for screening for human papillomavirus (HPV): Secondary | ICD-10-CM | POA: Diagnosis not present

## 2014-12-21 DIAGNOSIS — Z0000 Encounter for general adult medical examination without abnormal findings: Secondary | ICD-10-CM

## 2014-12-21 DIAGNOSIS — Z01419 Encounter for gynecological examination (general) (routine) without abnormal findings: Secondary | ICD-10-CM

## 2014-12-21 DIAGNOSIS — R51 Headache: Secondary | ICD-10-CM | POA: Diagnosis not present

## 2014-12-21 DIAGNOSIS — Z1239 Encounter for other screening for malignant neoplasm of breast: Secondary | ICD-10-CM | POA: Diagnosis not present

## 2014-12-21 DIAGNOSIS — G8929 Other chronic pain: Secondary | ICD-10-CM

## 2014-12-21 LAB — CBC WITH DIFFERENTIAL/PLATELET
BASOS ABS: 0 10*3/uL (ref 0.0–0.1)
Basophils Relative: 0.4 % (ref 0.0–3.0)
EOS PCT: 5 % (ref 0.0–5.0)
Eosinophils Absolute: 0.4 10*3/uL (ref 0.0–0.7)
HEMATOCRIT: 41.5 % (ref 36.0–46.0)
Hemoglobin: 13.5 g/dL (ref 12.0–15.0)
LYMPHS PCT: 23.2 % (ref 12.0–46.0)
Lymphs Abs: 2.1 10*3/uL (ref 0.7–4.0)
MCHC: 32.6 g/dL (ref 30.0–36.0)
MCV: 80.9 fl (ref 78.0–100.0)
Monocytes Absolute: 0.7 10*3/uL (ref 0.1–1.0)
Monocytes Relative: 7.7 % (ref 3.0–12.0)
Neutro Abs: 5.7 10*3/uL (ref 1.4–7.7)
Neutrophils Relative %: 63.7 % (ref 43.0–77.0)
Platelets: 426 10*3/uL — ABNORMAL HIGH (ref 150.0–400.0)
RBC: 5.13 Mil/uL — AB (ref 3.87–5.11)
RDW: 14.3 % (ref 11.5–15.5)
WBC: 8.9 10*3/uL (ref 4.0–10.5)

## 2014-12-21 LAB — COMPREHENSIVE METABOLIC PANEL
ALK PHOS: 47 U/L (ref 39–117)
ALT: 25 U/L (ref 0–35)
AST: 23 U/L (ref 0–37)
Albumin: 4 g/dL (ref 3.5–5.2)
BILIRUBIN TOTAL: 0.4 mg/dL (ref 0.2–1.2)
BUN: 12 mg/dL (ref 6–23)
CO2: 27 mEq/L (ref 19–32)
Calcium: 9.9 mg/dL (ref 8.4–10.5)
Chloride: 105 mEq/L (ref 96–112)
Creatinine, Ser: 0.86 mg/dL (ref 0.40–1.20)
GFR: 76.4 mL/min (ref >60.00)
Glucose, Bld: 101 mg/dL — ABNORMAL HIGH (ref 70–99)
Potassium: 4.2 mEq/L (ref 3.5–5.1)
Sodium: 137 mEq/L (ref 135–145)
TOTAL PROTEIN: 7.8 g/dL (ref 6.0–8.3)

## 2014-12-21 LAB — LIPID PANEL
Cholesterol: 203 mg/dL — ABNORMAL HIGH (ref 0–200)
HDL: 55.1 mg/dL (ref >39.00)
LDL Cholesterol: 109 mg/dL — ABNORMAL HIGH (ref 0–99)
NonHDL: 147.7
TRIGLYCERIDES: 194 mg/dL — AB (ref 0.0–149.0)
Total CHOL/HDL Ratio: 4
VLDL: 38.8 mg/dL (ref 0.0–40.0)

## 2014-12-21 LAB — TSH: TSH: 0.98 u[IU]/mL (ref 0.35–4.50)

## 2014-12-21 NOTE — Assessment & Plan Note (Signed)
Reviewed preventive care protocols, scheduled due services, and updated immunizations Discussed nutrition, exercise, diet, and healthy lifestyle.  Pap smear done today. Labs today.  Mammogram ordered- pt to schedule.

## 2014-12-21 NOTE — Progress Notes (Signed)
Subjective:   Patient ID: Anna Torres, female    DOB: 1970/01/31, 44 y.o.   MRN: [id number]  Anna Torres is a pleasant 44 y.o. year old female who presents to clinic today with Annual Exam  on 12/21/2014  HPI:  Has GYN- Dr. Lily Peer. Last pap smear 09/02/13.  Does have a h/o CIN I- has been over 5 years. Has never had a Mammogram.   Due for labs.  Current Outpatient Prescriptions on File Prior to Visit  Medication Sig Dispense Refill  . acyclovir (ZOVIRAX) 400 MG tablet Take 1 tablet (400 mg total) by mouth every other day. 30 tablet 1  . albuterol (PROVENTIL HFA;VENTOLIN HFA) 108 (90 BASE) MCG/ACT inhaler Inhale 1 puff into the lungs every 6 (six) hours as needed for wheezing or shortness of breath.    . beclomethasone (QVAR) 80 MCG/ACT inhaler Inhale 1 puff into the lungs 2 (two) times daily.    . Cholecalciferol (VITAMIN D3) 2000 UNITS TABS Take 1 tablet by mouth daily.    . fexofenadine (ALLEGRA) 180 MG tablet Take 180 mg by mouth daily.    Marland Kitchen glucosamine-chondroitin 500-400 MG tablet Take 1 tablet by mouth daily.    . Levonorgestrel-Ethinyl Estradiol (CAMRESE) 0.15-0.03 &0.01 MG tablet Take 1 tablet by mouth daily. 1 Package 3  . pantoprazole (PROTONIX) 40 MG tablet Take 40 mg by mouth daily.     . ranitidine (ZANTAC) 300 MG tablet Take 300 mg by mouth at bedtime.     No current facility-administered medications on file prior to visit.    Allergies  Allergen Reactions  . Codeine Nausea And Vomiting  . Hydrocodone Nausea And Vomiting  . Oxycodone Nausea And Vomiting  . Sumatriptan Other (See Comments)    Heart palpitations,SOB, chest pressure and shakiness    Past Medical History  Diagnosis Date  . CIN I (cervical intraepithelial neoplasia I)   . Vitamin D deficiency   . Ureteral reflux 1975  . Asthma   . Environmental and seasonal allergies   . Herpes genitalis in women   . GERD (gastroesophageal reflux disease)   . Asthma     last episode  january 2015  . Headache     Past Surgical History  Procedure Laterality Date  . Mandible fracture surgery  1990    retained hardware "wires"  . External ear surgery      bilateral "pinning" age 86  . Dilation and curettage of uterus    . Dilatation & curettage/hysteroscopy with trueclear      3'14-endometrial polyp-[facility]omens  . Back surgery Bilateral 2001    C4-C5 DISK "fusion"  . Esophageal manometry N/A 05/24/2013    Procedure: ESOPHAGEAL MANOMETRY (EM);  Surgeon: Willis Modena, MD;  [facility]: [facility];  Service: Endoscopy;  Laterality: N/A;  . Esophagogastroduodenoscopy (egd) with propofol N/A 05/24/2013    Procedure: ESOPHAGOGASTRODUODENOSCOPY (EGD) WITH PROPOFOL;  Surgeon: Willis Modena, MD;  Location: [facility];  Service: Endoscopy;  Laterality: N/A;  . Mandible surgery    . Esophagogastroduodenoscopy    . Cervical spine surgery    . Anterior cervical decomp/discectomy fusion N/A 01/31/2014    Procedure: ANTERIOR CERVICAL DECOMPRESSION/DISCECTOMY FUSION CERVICAL FIVE-SIX,CERVICAL SIX-SEVEN WITH HARDWARE REMOVAL OF CODMAN PLATE AT CERVIAL FOUR-FIVE;  Surgeon: Mariam Dollar, MD;  Location: [facility] OR;  Service: Neurosurgery;  Laterality: N/A;    Family History  Problem Relation Age of Onset  . Diabetes Mother   . Rheum arthritis Mother   . Arthritis Mother   .  Cancer Mother   . Graves' disease Sister     Social History   Social History  . Marital Status: Married    Spouse Name: N/A  . Number of Children: N/A  . Years of Education: N/A   Occupational History  . Not on file.   Social History Main Topics  . Smoking status: Never Smoker   . Smokeless tobacco: Never Used  . Alcohol Use: 0.0 oz/week    0 Standard drinks or equivalent per week     Comment: rare social  . Drug Use: No  . Sexual Activity: Yes    Birth Control/ Protection: Pill   Other Topics Concern  . Not on file   Social History Narrative   ** Merged History Encounter **       The PMH, PSH,  Social History, Family History, Medications, and allergies have been reviewed in [facility], and have been updated if relevant.    Review of Systems  Constitutional: Negative.   HENT: Negative.   Eyes: Negative.   Respiratory: Negative.   Cardiovascular: Negative.   Gastrointestinal: Negative.   Endocrine: Negative.   Genitourinary: Negative.   Musculoskeletal: Negative.   Skin: Negative.   Allergic/Immunologic: Negative.   Neurological: Negative.   Hematological: Negative.   Psychiatric/Behavioral: Negative.   All other systems reviewed and are negative.      Objective:    BP 102/68 mmHg  Pulse 115  Temp(Src) 98 F (36.7 C) (Oral)  Ht 5' 1.75" (1.568 m)  Wt 136 lb 12 oz (62.029 kg)  BMI 25.23 kg/m2  SpO2 96%  LMP 11/29/2014   Physical Exam   General:  Well-developed,well-nourished,in no acute distress; alert,appropriate and cooperative throughout examination Head:  normocephalic and atraumatic.   Eyes:  vision grossly intact, pupils equal, pupils round, and pupils reactive to light.   Ears:  R ear normal and L ear normal.   Nose:  no external deformity.   Mouth:  good dentition.   Neck:  No deformities, masses, or tenderness noted. Breasts:  No mass, nodules, thickening, tenderness, bulging, retraction, inflamation, nipple discharge or skin changes noted.   Lungs:  Normal respiratory effort, chest expands symmetrically. Lungs are clear to auscultation, no crackles or wheezes. Heart:  Normal rate and regular rhythm. S1 and S2 normal without gallop, murmur, click, rub or other extra sounds. Abdomen:  Bowel sounds positive,abdomen soft and non-tender without masses, organomegaly or hernias noted. Rectal:  no external abnormalities.   Genitalia:  Pelvic Exam:        External: normal female genitalia without lesions or masses        Vagina: normal without lesions or masses        Cervix: normal without lesions or masses        Adnexa: normal bimanual exam without masses or  fullness        Uterus: normal by palpation        Pap smear: performed Msk:  No deformity or scoliosis noted of thoracic or lumbar spine.   Extremities:  No clubbing, cyanosis, edema, or deformity noted with normal full range of motion of all joints.   Neurologic:  alert & oriented X3 and gait normal.   Skin:  Intact without suspicious lesions or rashes Cervical Nodes:  No lymphadenopathy noted Axillary Nodes:  No palpable lymphadenopathy Psych:  Cognition and judgment appear intact. Alert and cooperative with normal attention span and concentration. No apparent delusions, illusions, hallucinations      Assessment & Plan:  Well woman exam - Plan: CBC with Differential/Platelet, Comprehensive metabolic panel, Lipid panel, TSH, Cytology - PAP [place]  Chronic nonintractable headache, unspecified headache type No Follow-up on file.

## 2014-12-21 NOTE — Patient Instructions (Signed)
Great to see you. Please call to schedule a mammogram.  We will call you with your lab results and you can see them online.

## 2014-12-21 NOTE — Progress Notes (Signed)
Pre visit review using our clinic review tool, if applicable. No additional management support is needed unless otherwise documented below in the visit note. 

## 2014-12-22 ENCOUNTER — Encounter: Payer: Self-pay | Admitting: *Deleted

## 2014-12-22 LAB — CYTOLOGY - PAP

## 2014-12-22 NOTE — Addendum Note (Signed)
Addended by: CHAVERS, NATASHA C on: 12/22/2014 01:29 PM   Modules accepted: SmartSet  

## 2014-12-23 ENCOUNTER — Encounter: Payer: Self-pay | Admitting: *Deleted

## 2014-12-26 ENCOUNTER — Telehealth: Payer: Self-pay

## 2014-12-26 ENCOUNTER — Telehealth: Payer: Self-pay | Admitting: Family Medicine

## 2014-12-26 DIAGNOSIS — N921 Excessive and frequent menstruation with irregular cycle: Secondary | ICD-10-CM

## 2014-12-26 MED ORDER — NORGESTIMATE-ETH ESTRADIOL 0.25-35 MG-MCG PO TABS: 1.0000 | ORAL_TABLET | Freq: Every day | ORAL | Status: DC

## 2014-12-26 NOTE — Telephone Encounter (Signed)
eRx sent

## 2014-12-26 NOTE — Telephone Encounter (Signed)
Pt left v/m; pt seen 12/21/14 for annual exam; pt had spotted on 12/15/14; since appt pt spotted 12/23/14 for approx 5 hours and again last night spotted. Pt wants to know if needs to change BC pill or what to do to stop spotting. Pt request cb.

## 2014-12-26 NOTE — Telephone Encounter (Signed)
Since this has been an ongoing issue, we could certainly try an OCP with a higher dose of estrogen.  If she is ok with this, I will send rx to her pharmacy on file [facility]).

## 2014-12-26 NOTE — Telephone Encounter (Signed)
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  Can you write a letter stating that pt is up to date on these?\ Pt is requesting it be faxed to attn will [phone] cb number for pt is [phone]

## 2014-12-26 NOTE — Telephone Encounter (Signed)
Spoke to pt and advised her that she has not had a TDap, but flu only. Copy faxed as requested. Also advised pt that [facility] is on epic and they are also able to view.

## 2014-12-26 NOTE — Telephone Encounter (Signed)
Spoke to pt who is agreeable to new Rx; can be sent to [facility]

## 2015-01-05 ENCOUNTER — Ambulatory Visit
Admission: RE | Admit: 2015-01-05 | Discharge: 2015-01-05 | Disposition: A | Discharge status: Home / Self Care | Payer: 59 | Source: Ambulatory Visit | Attending: Family Medicine | Admitting: Family Medicine

## 2015-01-05 DIAGNOSIS — Z1239 Encounter for other screening for malignant neoplasm of breast: Secondary | ICD-10-CM

## 2015-01-06 NOTE — Telephone Encounter (Signed)
Spoke to pt and advised. Pt is not wanting to change BCP. She is agreeable to referral and was advised to await a call with appt details

## 2015-01-06 NOTE — Telephone Encounter (Signed)
Pt called in, is still spotting, was wanting to know if she should go see Dr Ardyth Harps, or if she should get something with a lower estrogen amount.   cb number for pt is [phone] Thank you

## 2015-01-06 NOTE — Telephone Encounter (Signed)
I do not think a lower estrogen dose would be effective- that was what she was on previously.  Will refer to GYN- Dr. Ardyth Harps.  Does she want to try another OCP while waiting for appt?

## 2015-01-10 ENCOUNTER — Encounter: Payer: Self-pay | Admitting: Allergy and Immunology

## 2015-01-10 ENCOUNTER — Ambulatory Visit: Payer: Self-pay | Admitting: Allergy and Immunology

## 2015-01-10 ENCOUNTER — Ambulatory Visit (INDEPENDENT_AMBULATORY_CARE_PROVIDER_SITE_OTHER): Payer: 59 | Admitting: Allergy and Immunology

## 2015-01-10 VITALS — BP 112/76 | HR 80 | Resp 16

## 2015-01-10 DIAGNOSIS — J4540 Moderate persistent asthma, uncomplicated: Secondary | ICD-10-CM | POA: Diagnosis not present

## 2015-01-10 DIAGNOSIS — R51 Headache: Secondary | ICD-10-CM | POA: Diagnosis not present

## 2015-01-10 DIAGNOSIS — H1010 Acute atopic conjunctivitis, unspecified eye: Secondary | ICD-10-CM | POA: Diagnosis not present

## 2015-01-10 DIAGNOSIS — J387 Other diseases of larynx: Secondary | ICD-10-CM

## 2015-01-10 DIAGNOSIS — J309 Allergic rhinitis, unspecified: Secondary | ICD-10-CM

## 2015-01-10 DIAGNOSIS — R519 Headache, unspecified: Secondary | ICD-10-CM

## 2015-01-10 DIAGNOSIS — K219 Gastro-esophageal reflux disease without esophagitis: Secondary | ICD-10-CM

## 2015-01-10 NOTE — Patient Instructions (Signed)
  1. Continue Qvar 80 2 inhalations daily  2. Continue Rhinocort one spray each nostril 3-7 times per week  3. Continue montelukast 10 mg tablet 1 time per day  4. Continue pantoprazole 40 mg in the morning plus ranitidine 300 mg in the evening  5. Continue ProAir HFA and antihistamine if needed  6. Continue action plan for asthma flare including high dose Qvar at 3 inhalations 3 times per day  7. Return to clinic in 6 months or earlier if problem

## 2015-01-10 NOTE — Progress Notes (Signed)
[facility] [place]  Follow-up Note  Refering Provider: Dianne Dun, MD Primary Provider: Ruthe Mannan, MD  Subjective:   Anna Torres is a 44 y.o. female who returns to the [facility] in re-evaluation of the following:  HPI Comments:  Anna Torres returns to this clinic on 01/10/2015 in reevaluation of her asthma, allergic rhinitis, LPR, and headache. Over the course of the past 6 months she is done very well. She's had very little problems with asthma other than some exercise-induced bronchospastic symptoms. She rarely uses any short acting bronchodilator. She is using Qvar 80 2 inhalations one time per day. Overall she feels very satisfied with the response she is received while using this form of therapy. She has not required a systemic steroid for an exacerbation of her asthma over the course of the past 6 months. Her nose is doing relatively well although she has had a little bit more problems with nasal congestion and nose blowing over the course of the past several weeks. She is no longer using her Rhinocort but continues on montelukast. Her reflux is under very good control. She does not have any obstruction or swallowingdifficulty. She does not have any regurgitation. She thinks her headaches are doing relatively well. They've decreased in frequency and they are of very short duration that does not require any specific therapy. She did not consistently use Periactin if she does not feel that she needs to use this at this point in time. She has seen a neurologist in the past and a neurosurgeon the past and it does appear as though some of her headaches are related to her previous neck surgery. She did obtain the flu vaccine this year.   Current Outpatient Prescriptions on File Prior to Visit  Medication Sig Dispense Refill  . beclomethasone (QVAR) 80 MCG/ACT inhaler Inhale 1 puff into the lungs 2 (two) times daily.    .  Cholecalciferol (VITAMIN D3) 2000 UNITS TABS Take 1 tablet by mouth daily.    . fexofenadine (ALLEGRA) 180 MG tablet Take 180 mg by mouth daily.    Marland Kitchen glucosamine-chondroitin 500-400 MG tablet Take 1 tablet by mouth daily.    . norgestimate-ethinyl estradiol (ORTHO-CYCLEN, 28,) 0.25-35 MG-MCG tablet Take 1 tablet by mouth daily. 1 Package 11  . pantoprazole (PROTONIX) 40 MG tablet Take 40 mg by mouth daily.     . ranitidine (ZANTAC) 300 MG tablet Take 300 mg by mouth at bedtime.     No current facility-administered medications on file prior to visit.    No orders of the defined types were placed in this encounter.    Past Medical History  Diagnosis Date  . CIN I (cervical intraepithelial neoplasia I)   . Vitamin D deficiency   . Ureteral reflux 1975  . Asthma   . Environmental and seasonal allergies   . Herpes genitalis in women   . GERD (gastroesophageal reflux disease)   . Asthma     last episode january 2015  . Headache     Past Surgical History  Procedure Laterality Date  . Mandible fracture surgery  1990    retained hardware "wires"  . External ear surgery      bilateral "pinning" age 8  . Dilation and curettage of uterus    . Dilatation & curettage/hysteroscopy with trueclear      3'14-endometrial polyp-Womens  . Back surgery Bilateral 2001    C4-C5 DISK "fusion"  . Esophageal  manometry N/A 05/24/2013    Procedure: ESOPHAGEAL MANOMETRY (EM);  Surgeon: Willis Modena, MD;  [facility]: [facility];  Service: Endoscopy;  Laterality: N/A;  . Esophagogastroduodenoscopy (egd) with propofol N/A 05/24/2013    Procedure: ESOPHAGOGASTRODUODENOSCOPY (EGD) WITH PROPOFOL;  Surgeon: Willis Modena, MD;  Location: [facility];  Service: Endoscopy;  Laterality: N/A;  . Mandible surgery    . Esophagogastroduodenoscopy    . Cervical spine surgery    . Anterior cervical decomp/discectomy fusion N/A 01/31/2014    Procedure: ANTERIOR CERVICAL DECOMPRESSION/DISCECTOMY FUSION CERVICAL  FIVE-SIX,CERVICAL SIX-SEVEN WITH HARDWARE REMOVAL OF CODMAN PLATE AT CERVIAL FOUR-FIVE;  Surgeon: Mariam Dollar, MD;  Location: [facility] OR;  Service: Neurosurgery;  Laterality: N/A;    Allergies  Allergen Reactions  . Codeine Nausea And Vomiting  . Hydrocodone Nausea And Vomiting  . Oxycodone Nausea And Vomiting  . Sumatriptan Other (See Comments)    Heart palpitations,SOB, chest pressure and shakiness    Review of Systems  Constitutional: Negative.   HENT: Negative.   Eyes: Negative.   Respiratory: Negative.   Cardiovascular: Negative.   Gastrointestinal: Negative.   Musculoskeletal: Negative.   Skin: Negative.   Neurological: Negative.   Hematological: Negative.      Objective:   Filed Vitals:   01/10/15 0855  BP: 112/76  Pulse: 80  Resp: 16          Physical Exam  Constitutional: She appears well-developed and well-nourished. No distress.  HENT:  Head: Normocephalic and atraumatic. Head is without right periorbital erythema and without left periorbital erythema.  Right Ear: Tympanic membrane, external ear and ear canal normal. No drainage or tenderness. No foreign bodies. Tympanic membrane is not injected, not scarred, not perforated, not erythematous, not retracted and not bulging. No middle ear effusion.  Left Ear: Tympanic membrane, external ear and ear canal normal. No drainage or tenderness. No foreign bodies. Tympanic membrane is not injected, not scarred, not perforated, not erythematous, not retracted and not bulging.  No middle ear effusion.  Nose: Nose normal. No mucosal edema, rhinorrhea, nose lacerations or sinus tenderness.  No foreign bodies.  Mouth/Throat: Oropharynx is clear and moist. No oropharyngeal exudate, posterior oropharyngeal edema, posterior oropharyngeal erythema or tonsillar abscesses.  Eyes: Lids are normal. Right eye exhibits no chemosis, no discharge and no exudate. No foreign body present in the right eye. Left eye exhibits no chemosis, no  discharge and no exudate. No foreign body present in the left eye. Right conjunctiva is not injected. Left conjunctiva is not injected.  Neck: Neck supple. No tracheal tenderness present. No tracheal deviation and no edema present. No thyroid mass and no thyromegaly present.  Cardiovascular: Normal rate, regular rhythm, S1 normal and S2 normal.  Exam reveals no gallop.   No murmur heard. Pulmonary/Chest: No accessory muscle usage or stridor. No respiratory distress. She has no wheezes. She has no rhonchi. She has no rales.  Abdominal: Soft.  Lymphadenopathy:       Head (right side): No tonsillar adenopathy present.       Head (left side): No tonsillar adenopathy present.    She has no cervical adenopathy.  Neurological: She is alert.  Skin: No rash noted. She is not diaphoretic.  Psychiatric: She has a normal mood and affect. Her behavior is normal.    Diagnostics:    Spirometry was performed and demonstrated an FEV1 of 1.73 at 67 % of predicted.  The patient had an Asthma Control Test with the following results: ACT Total Score: 24.  Assessment and Plan:   1. Moderate persistent asthma, uncomplicated   2. Allergic rhinoconjunctivitis   3. LPRD (laryngopharyngeal reflux disease)   4. Headache disorder      1. Continue Qvar 80 2 inhalations daily  2. Continue Rhinocort one spray each nostril 3-7 times per week  3. Continue montelukast 10 mg tablet 1 time per day  4. Continue pantoprazole 40 mg in the morning plus ranitidine 300 mg in the evening  5. Continue ProAir HFA and antihistamine if needed  6. Continue action plan for asthma flare including high dose Qvar at 3 inhalations 3 times per day  7. Return to clinic in 6 months or earlier if problem   [place] has done very well on her current medical therapy and we'll continue to have her use Qvar and Rhinocort and montelukast and see her back in this clinic in 6 months or earlier if there is a problem. Fortunately, her  esophageal dysmotility and GERD and LPR are under excellent control while using pantoprazole and ranitidine and we'll continue her on these medications as well.  Laurette Schimke, MD [facility]

## 2015-01-11 ENCOUNTER — Encounter: Payer: Self-pay | Admitting: Family Medicine

## 2015-01-22 ENCOUNTER — Other Ambulatory Visit: Payer: Self-pay | Admitting: Family Medicine

## 2015-01-30 ENCOUNTER — Ambulatory Visit: Payer: 59 | Admitting: Licensed Clinical Social Worker

## 2015-01-31 ENCOUNTER — Ambulatory Visit (INDEPENDENT_AMBULATORY_CARE_PROVIDER_SITE_OTHER): Payer: BLUE CROSS/BLUE SHIELD | Admitting: Licensed Clinical Social Worker

## 2015-01-31 DIAGNOSIS — F4322 Adjustment disorder with anxiety: Secondary | ICD-10-CM

## 2015-02-10 ENCOUNTER — Ambulatory Visit (INDEPENDENT_AMBULATORY_CARE_PROVIDER_SITE_OTHER): Payer: BLUE CROSS/BLUE SHIELD | Admitting: Gynecology

## 2015-02-10 ENCOUNTER — Encounter: Payer: Self-pay | Admitting: Gynecology

## 2015-02-10 VITALS — BP 118/76 | Ht 62.0 in | Wt 133.0 lb

## 2015-02-10 DIAGNOSIS — N840 Polyp of corpus uteri: Secondary | ICD-10-CM | POA: Diagnosis not present

## 2015-02-10 DIAGNOSIS — N921 Excessive and frequent menstruation with irregular cycle: Secondary | ICD-10-CM | POA: Diagnosis not present

## 2015-02-10 NOTE — Progress Notes (Addendum)
   Patient is a 45 year old who was seen by her primary care physician Dr. Dayton Martes in December 2016 and had switched the patient from levonorgestrel/ethinyl estradiol triphasic to a monophasic norgestimate/ethinyl estradiol because of patient's complaint of having been spotting on and off since November. Patient likes to take the pill continuously and withdrawal every 3-4 months because she suffers from severe dysmenorrhea nausea and abdominal pains during the time of her menses. Review of her record and indicated that back in 2014 she had a resectoscopic polypectomy which was benign. She had done well until recent.  Patient brought her blood work as well as her Pap smear from her PCP from December this year which are part of our electronic record she had a TSH which was normal along with a comprehensive metabolic panel her hemoglobin and hematocrit were normal platelet count slightly elevated at 426,000.  Exam: Abdomen: Soft nontender no rebound or guarding Pelvic: Bartholin urethra Skene was within normal limits Vagina: No blood noted in the vaginal vault Cervix: No lesions or discharge Uterus: Anteverted normal size shape consistency although limited due to patient's vaginismus Adnexa: No tenderness although limited due to patient's vaginismus Rectal exam: Not done  Patient was counseled for an endometrial biopsy as part of evaluation for irregular bleeding. The cervix was cleansed with Betadine solution. A single tooth tenaculum was placed on the anterior cervical lip this cause a lot of anxiety and apprehension for the patient and when the Pipelle was introduced into the uterine cavity she became very uncomfortable and anxious  and minimal tissue was obtained but was submitted for histological evaluation. The  Single tooth tenaculum was removed. Silver  nitrate was placed on and site that was oozing from  tenaculum  prong. Once hemostasis was established the speculum was removed.  Assessment/plan:  Patient with breakthrough bleeding on oral contraceptive pill had been on a triphasic recently switch to a monophasic this is only patient's second month. She will take it continuously and withdrawal every 3 months and she had been doing before. She'll return back to the office next week for sonohysterogram to assess the intrauterine cavity to make sure that she has not had recurrence of endometrial polyp like she had in 2014. Also will be able to assess her ovaries since her pelvic exam was limited. The little amount of tissue that was obtained and endometrial biopsy was submitted for histological evaluation.

## 2015-02-13 ENCOUNTER — Other Ambulatory Visit: Payer: Self-pay | Admitting: Gynecology

## 2015-02-13 DIAGNOSIS — N939 Abnormal uterine and vaginal bleeding, unspecified: Secondary | ICD-10-CM

## 2015-02-16 ENCOUNTER — Other Ambulatory Visit: Payer: BLUE CROSS/BLUE SHIELD

## 2015-02-16 ENCOUNTER — Ambulatory Visit: Payer: BLUE CROSS/BLUE SHIELD | Admitting: Gynecology

## 2015-02-20 ENCOUNTER — Other Ambulatory Visit: Payer: Self-pay | Admitting: Neurology

## 2015-02-20 MED ORDER — MONTELUKAST SODIUM 10 MG PO TABS: 10.0000 mg | ORAL_TABLET | Freq: Every day | ORAL | Status: DC

## 2015-02-21 ENCOUNTER — Ambulatory Visit (INDEPENDENT_AMBULATORY_CARE_PROVIDER_SITE_OTHER): Payer: BLUE CROSS/BLUE SHIELD | Admitting: Licensed Clinical Social Worker

## 2015-02-21 DIAGNOSIS — F4322 Adjustment disorder with anxiety: Secondary | ICD-10-CM

## 2015-03-14 ENCOUNTER — Ambulatory Visit (INDEPENDENT_AMBULATORY_CARE_PROVIDER_SITE_OTHER): Payer: BLUE CROSS/BLUE SHIELD | Admitting: Licensed Clinical Social Worker

## 2015-03-14 DIAGNOSIS — F4322 Adjustment disorder with anxiety: Secondary | ICD-10-CM

## 2015-03-16 ENCOUNTER — Other Ambulatory Visit: Payer: Self-pay | Admitting: Neurology

## 2015-03-16 MED ORDER — RANITIDINE HCL 300 MG PO TABS: 300.0000 mg | ORAL_TABLET | Freq: Every day | ORAL | Status: DC

## 2015-03-20 ENCOUNTER — Telehealth: Payer: Self-pay

## 2015-03-20 ENCOUNTER — Encounter: Payer: Self-pay | Admitting: Family Medicine

## 2015-03-20 ENCOUNTER — Ambulatory Visit
Admission: RE | Admit: 2015-03-20 | Discharge: 2015-03-20 | Disposition: A | Discharge status: Home / Self Care | Payer: BLUE CROSS/BLUE SHIELD | Source: Ambulatory Visit | Attending: Family Medicine | Admitting: Family Medicine

## 2015-03-20 ENCOUNTER — Ambulatory Visit (INDEPENDENT_AMBULATORY_CARE_PROVIDER_SITE_OTHER): Payer: BLUE CROSS/BLUE SHIELD | Admitting: Family Medicine

## 2015-03-20 ENCOUNTER — Other Ambulatory Visit: Payer: Self-pay | Admitting: Family Medicine

## 2015-03-20 VITALS — BP 114/68 | HR 121 | Temp 97.9°F | Wt 135.0 lb

## 2015-03-20 DIAGNOSIS — K760 Fatty (change of) liver, not elsewhere classified: Secondary | ICD-10-CM | POA: Insufficient documentation

## 2015-03-20 DIAGNOSIS — G8929 Other chronic pain: Secondary | ICD-10-CM

## 2015-03-20 DIAGNOSIS — R1030 Lower abdominal pain, unspecified: Secondary | ICD-10-CM | POA: Insufficient documentation

## 2015-03-20 DIAGNOSIS — R1031 Right lower quadrant pain: Secondary | ICD-10-CM | POA: Diagnosis not present

## 2015-03-20 DIAGNOSIS — R112 Nausea with vomiting, unspecified: Secondary | ICD-10-CM | POA: Insufficient documentation

## 2015-03-20 DIAGNOSIS — R1032 Left lower quadrant pain: Secondary | ICD-10-CM | POA: Insufficient documentation

## 2015-03-20 DIAGNOSIS — R1084 Generalized abdominal pain: Secondary | ICD-10-CM

## 2015-03-20 DIAGNOSIS — K921 Melena: Secondary | ICD-10-CM | POA: Diagnosis not present

## 2015-03-20 LAB — COMPREHENSIVE METABOLIC PANEL
ALBUMIN: 4.3 g/dL (ref 3.5–5.2)
ALK PHOS: 55 U/L (ref 39–117)
ALT: 17 U/L (ref 0–35)
AST: 17 U/L (ref 0–37)
BUN: 9 mg/dL (ref 6–23)
CALCIUM: 9.6 mg/dL (ref 8.4–10.5)
CO2: 24 mEq/L (ref 19–32)
CREATININE: 0.69 mg/dL (ref 0.40–1.20)
Chloride: 106 mEq/L (ref 96–112)
GFR: 98.4 mL/min (ref >60.00)
Glucose, Bld: 101 mg/dL — ABNORMAL HIGH (ref 70–99)
Potassium: 4.1 mEq/L (ref 3.5–5.1)
SODIUM: 138 meq/L (ref 135–145)
TOTAL PROTEIN: 8.1 g/dL (ref 6.0–8.3)
Total Bilirubin: 0.3 mg/dL (ref 0.2–1.2)

## 2015-03-20 LAB — CBC WITH DIFFERENTIAL/PLATELET
Basophils Absolute: 0.1 10*3/uL (ref 0.0–0.1)
Basophils Relative: 0.6 % (ref 0.0–3.0)
EOS ABS: 0.3 10*3/uL (ref 0.0–0.7)
EOS PCT: 2.3 % (ref 0.0–5.0)
HEMATOCRIT: 42.5 % (ref 36.0–46.0)
HEMOGLOBIN: 14.1 g/dL (ref 12.0–15.0)
LYMPHS PCT: 20.4 % (ref 12.0–46.0)
Lymphs Abs: 2.8 10*3/uL (ref 0.7–4.0)
MCHC: 33.3 g/dL (ref 30.0–36.0)
MCV: 80.7 fl (ref 78.0–100.0)
MONO ABS: 0.9 10*3/uL (ref 0.1–1.0)
Monocytes Relative: 6.5 % (ref 3.0–12.0)
Neutro Abs: 9.5 10*3/uL — ABNORMAL HIGH (ref 1.4–7.7)
Neutrophils Relative %: 70.2 % (ref 43.0–77.0)
Platelets: 438 10*3/uL — ABNORMAL HIGH (ref 150.0–400.0)
RBC: 5.26 Mil/uL — AB (ref 3.87–5.11)
RDW: 13.7 % (ref 11.5–15.5)
WBC: 13.5 10*3/uL — AB (ref 4.0–10.5)

## 2015-03-20 LAB — LIPASE: LIPASE: 31 U/L (ref 11.0–59.0)

## 2015-03-20 LAB — H. PYLORI ANTIBODY, IGG: H Pylori IgG: NEGATIVE

## 2015-03-20 MED ORDER — IOHEXOL 300 MG/ML  SOLN
100.0000 mL | Freq: Once | INTRAMUSCULAR | Status: AC | PRN
Start: 2015-03-20 — End: 2015-03-20
  Administered 2015-03-20: 100 mL via INTRAVENOUS

## 2015-03-20 NOTE — Patient Instructions (Signed)
Good to see you. Please stop by to see marion on your way out. I will call you as soon as I have the results from today.

## 2015-03-20 NOTE — Progress Notes (Signed)
Subjective:   Patient ID: Anna Torres, female    DOB: 19-Mar-1970, 45 y.o.   MRN: [id number]  Anna Torres is a pleasant 45 y.o. year old female who presents to clinic today with her husband for Abdominal Pain and Blood In Stools  on 03/20/2015  HPI:  Left lower quadrant abdominal pain and bloody stool- has been having intermittent RUQ and RLQ pain for over a year.  Neg pelvic US on 04/22/2014 and has been seen by GYN for this as well.  Over the weekend, acute worsening of RLQ and RUP that radiated to epigastric area with vomiting and ? Blood in her stool.  She feels stool was more red and not necessary any blood on paper or in the water.  Pain is a little better today.  No fever but has been diaphoretic.  No one else in home has similar symptoms.  Was seen in UC for this over the weekend.  Given an order to get an abdominal US today at [facility].  She has not yet gone.  Current Outpatient Prescriptions on File Prior to Visit  Medication Sig Dispense Refill  . acyclovir (ZOVIRAX) 400 MG tablet TAKE ONE (1) TABLET BY MOUTH EVERY OTHERDAY 30 tablet 1  . albuterol (PROAIR HFA) 108 (90 BASE) MCG/ACT inhaler Inhale 2 puffs into the lungs every 4 (four) hours as needed for wheezing or shortness of breath.    . beclomethasone (QVAR) 80 MCG/ACT inhaler Inhale 1 puff into the lungs 2 (two) times daily.    . Cholecalciferol (VITAMIN D3) 2000 UNITS TABS Take 1 tablet by mouth daily.    . fexofenadine (ALLEGRA) 180 MG tablet Take 180 mg by mouth daily.    Marland Kitchen glucosamine-chondroitin 500-400 MG tablet Take 1 tablet by mouth daily.    . montelukast (SINGULAIR) 10 MG tablet Take 1 tablet (10 mg total) by mouth at bedtime. 30 tablet 3  . norgestimate-ethinyl estradiol (ORTHO-CYCLEN, 28,) 0.25-35 MG-MCG tablet Take 1 tablet by mouth daily. 1 Package 11  . pantoprazole (PROTONIX) 40 MG tablet Take 40 mg by mouth daily.     . ranitidine (ZANTAC) 300 MG tablet Take 1 tablet (300 mg total) by mouth at  bedtime. 30 tablet 3   No current facility-administered medications on file prior to visit.    Allergies  Allergen Reactions  . Codeine Nausea And Vomiting  . Hydrocodone Nausea And Vomiting  . Oxycodone Nausea And Vomiting  . Sumatriptan Other (See Comments)    Heart palpitations,SOB, chest pressure and shakiness    Past Medical History  Diagnosis Date  . CIN I (cervical intraepithelial neoplasia I)   . Vitamin D deficiency   . Ureteral reflux 1975  . Asthma   . Environmental and seasonal allergies   . Herpes genitalis in women   . GERD (gastroesophageal reflux disease)   . Asthma     last episode january 2015  . Headache     Past Surgical History  Procedure Laterality Date  . Mandible fracture surgery  1990    retained hardware "wires"  . External ear surgery      bilateral "pinning" age 89  . Dilation and curettage of uterus    . Dilatation & curettage/hysteroscopy with trueclear      3'14-endometrial polyp-[facility]om[facility]  . Back surgery Bilateral 2001    C4-C5 DISK "fusion"  . Esophageal manometry N/A 05/24/2013    Procedure: ESOPHAGEAL MANOMETRY (EM);  Surgeon: Willis Modena, MD;  Location: [facility];  Service: Endoscopy;  Laterality: N/A;  . Esophagogastroduodenoscopy (egd) with propofol N/A 05/24/2013    Procedure: ESOPHAGOGASTRODUODENOSCOPY (EGD) WITH PROPOFOL;  Surgeon: Willis Modena, MD;  Location: [facility];  Service: Endoscopy;  Laterality: N/A;  . Mandible surgery    . Esophagogastroduodenoscopy    . Cervical spine surgery    . Anterior cervical decomp/discectomy fusion N/A 01/31/2014    Procedure: ANTERIOR CERVICAL DECOMPRESSION/DISCECTOMY FUSION CERVICAL FIVE-SIX,CERVICAL SIX-SEVEN WITH HARDWARE REMOVAL OF CODMAN PLATE AT CERVIAL FOUR-FIVE;  Surgeon: Mariam Dollar, MD;  Location: [facility] OR;  Service: Neurosurgery;  Laterality: N/A;    Family History  Problem Relation Age of Onset  . Diabetes Mother   . Rheum arthritis Mother   . Arthritis Mother   . Cancer  Mother   . Graves' disease Sister     Social History   Social History  . Marital Status: Married    Spouse Name: N/A  . Number of Children: N/A  . Years of Education: N/A   Occupational History  . Not on file.   Social History Main Topics  . Smoking status: Never Smoker   . Smokeless tobacco: Never Used  . Alcohol Use: 0.0 oz/week    0 Standard drinks or equivalent per week     Comment: rare social  . Drug Use: No  . Sexual Activity: Yes    Birth Control/ Protection: Pill   Other Topics Concern  . Not on file   Social History Narrative   ** Merged History Encounter **       The PMH, PSH, Social History, Family History, Medications, and allergies have been reviewed in [facility], and have been updated if relevant.     Review of Systems  Constitutional: Positive for diaphoresis. Negative for fever.  Gastrointestinal: Positive for nausea, abdominal pain, diarrhea, blood in stool and abdominal distention. Negative for constipation and rectal pain.  Genitourinary: Negative.   Neurological: Negative.   Hematological: Negative.   Psychiatric/Behavioral: Negative.        Objective:    BP 114/68 mmHg  Pulse 121  Temp(Src) 97.9 F (36.6 C) (Oral)  Wt 135 lb (61.236 kg)  SpO2 99%   Physical Exam  Constitutional: She is oriented to person, place, and time. She appears well-developed and well-nourished. No distress.  HENT:  Head: Normocephalic.  Eyes: Conjunctivae are normal.  Cardiovascular: Normal rate.   Pulmonary/Chest: Effort normal.  Abdominal: Soft. She exhibits no distension and no mass. There is tenderness. There is rebound and guarding.  Musculoskeletal: Normal range of motion.  Neurological: She is alert and oriented to person, place, and time.  Skin: Skin is warm and dry. She is not diaphoretic.  Psychiatric: She has a normal mood and affect. Her behavior is normal. Judgment and thought content normal.  Nursing note and vitals reviewed.           Assessment & Plan:   Abdominal pain, left lower quadrant  Blood in stool No Follow-up on file.

## 2015-03-20 NOTE — Assessment & Plan Note (Signed)
Diffusely tender over entire right side of her abdomen and epigastrium. Labs today- CT of abdomen and pelvis for further evaluation. Rule out appendicitis, diverticulitis, gallstones. The patient indicates understanding of these issues and agrees with the plan. Orders Placed This Encounter  Procedures  . CT Abdomen Pelvis W Contrast  . CBC with Differential/Platelet  . Comprehensive metabolic panel  . Lipase  . H. pylori antibody, IgG

## 2015-03-20 NOTE — Telephone Encounter (Signed)
Pt has appt 03/20/15 at 11:30 with Dr Dayton Martes

## 2015-03-20 NOTE — Progress Notes (Signed)
Pre visit review using our clinic review tool, if applicable. No additional management support is needed unless otherwise documented below in the visit note. 

## 2015-03-20 NOTE — Telephone Encounter (Signed)
PLEASE NOTE: All timestamps contained within this report are represented as [place] Standard Time. CONFIDENTIALTY NOTICE: This fax transmission is intended only for the addressee. It contains information that is legally privileged, confidential or otherwise protected from use or disclosure. If you are not the intended recipient, you are strictly prohibited from reviewing, disclosing, copying using or disseminating any of this information or taking any action in reliance on or regarding this information. If you have received this fax in error, please notify us immediately by telephone so that we can arrange for its return to [place]. Phone: [phone], Toll-Free: [phone], Fax: [phone] Page: 1 of 2 Call Id: [id number] [facility] Day - Client TELEPHONE ADVICE RECORD [facility] Medical Call Center Patient Name: Anna Torres Gender: Female DOB: Oct 27, 1970 Age: 45 Y 7 M 17 D Return Phone Number: [phone] (Primary) Address: City/State/Zip: [place] Client [facility] Day - Client Client Site [facility] [place] - Day Physician Ruthe Mannan Contact Type Call Who Is Calling Patient / Member / Family / Caregiver Call Type Triage / Clinical Relationship To Patient Self Return Phone Number [phone] (Primary) Chief Complaint Abdominal Pain Reason for Call Symptomatic / Request for Health Information Initial Comment caller states she is having left lower quadrant pain that wraps around her hip into her back - also radiates down her leg Appointment Disposition EMR Appointment Not Necessary Info pasted into Epic No PreDisposition Call Doctor Translation No Nurse Assessment Nurse: Para March, RN, Gavin Pound Date/Time (Eastern Time): 03/18/2015 10:54:25 AM Confirm and document reason for call. If symptomatic, describe symptoms. You must click the next button to save text entered. ---Caller states she has right lower quad pain. Pain between  the hip bone and pubic bone and it radiated up to rib cage and around to the hip. Has been having the pain consistently since about 1900 last night. Was intermittent prior to that for a long time and had seen PCP and OB about the problem. Feels pressure and feels like she is bloated. No fever, no vomiting or diarrhea. Has the patient traveled out of the country within the last 30 days? ---No Does the patient have any new or worsening symptoms? ---Yes Will a triage be completed? ---Yes Related visit to physician within the last 2 weeks? ---No Does the PT have any chronic conditions? (i.e. diabetes, asthma, etc.) ---Yes List chronic conditions. ---asthma- no current symptoms, reflux, Is the patient pregnant or possibly pregnant? (Ask all females between the ages of 25-55) ---No Is this a behavioral health or substance abuse call? ---No PLEASE NOTE: All timestamps contained within this report are represented as [place] Standard Time. CONFIDENTIALTY NOTICE: This fax transmission is intended only for the addressee. It contains information that is legally privileged, confidential or otherwise protected from use or disclosure. If you are not the intended recipient, you are strictly prohibited from reviewing, disclosing, copying using or disseminating any of this information or taking any action in reliance on or regarding this information. If you have received this fax in error, please notify us immediately by telephone so that we can arrange for its return to [place]. Phone: [phone], Toll-Free: [phone], Fax: [phone] Page: 2 of 2 Call Id: [id number] Guidelines Guideline Title Affirmed Question Affirmed Notes Nurse Date/Time Lamount Cohen Time) Abdominal Pain - Female [1] MILD-MODERATE pain AND [2] constant AND [3] present > 2 hours Para March, RN, Gavin Pound 03/18/2015 11:01:24 AM Disp. Time Lamount Cohen Time) Disposition Final User 03/18/2015 11:04:01 AM See Physician within 4 Hours (or  PCP  triage) Yes Para March, RN, Jetty Duhamel Understands: Yes Disagree/Comply: Comply Care Advice Given Per Guideline SEE PHYSICIAN WITHIN 4 HOURS (or PCP triage): REST: Lie down and rest until seen. NOTHING BY MOUTH: Do not eat or drink anything for now. CALL BACK IF: * You become worse. CARE ADVICE given per Abdominal Pain, Female (Adult) guideline. Referrals [facility]

## 2015-04-03 ENCOUNTER — Ambulatory Visit (INDEPENDENT_AMBULATORY_CARE_PROVIDER_SITE_OTHER): Payer: BLUE CROSS/BLUE SHIELD | Admitting: Licensed Clinical Social Worker

## 2015-04-03 DIAGNOSIS — F4322 Adjustment disorder with anxiety: Secondary | ICD-10-CM

## 2015-04-18 ENCOUNTER — Ambulatory Visit (INDEPENDENT_AMBULATORY_CARE_PROVIDER_SITE_OTHER): Payer: BLUE CROSS/BLUE SHIELD | Admitting: Allergy and Immunology

## 2015-04-18 ENCOUNTER — Encounter: Payer: Self-pay | Admitting: Allergy and Immunology

## 2015-04-18 VITALS — BP 110/66 | HR 80 | Resp 20

## 2015-04-18 DIAGNOSIS — K224 Dyskinesia of esophagus: Secondary | ICD-10-CM | POA: Diagnosis not present

## 2015-04-18 DIAGNOSIS — J4541 Moderate persistent asthma with (acute) exacerbation: Secondary | ICD-10-CM | POA: Diagnosis not present

## 2015-04-18 DIAGNOSIS — J387 Other diseases of larynx: Secondary | ICD-10-CM | POA: Diagnosis not present

## 2015-04-18 DIAGNOSIS — J309 Allergic rhinitis, unspecified: Secondary | ICD-10-CM

## 2015-04-18 DIAGNOSIS — H1010 Acute atopic conjunctivitis, unspecified eye: Secondary | ICD-10-CM | POA: Diagnosis not present

## 2015-04-18 DIAGNOSIS — K219 Gastro-esophageal reflux disease without esophagitis: Secondary | ICD-10-CM

## 2015-04-18 MED ORDER — METHYLPREDNISOLONE ACETATE 80 MG/ML IJ SUSP
80.0000 mg | Freq: Once | INTRAMUSCULAR | Status: AC
  Administered 2015-04-18: 80 mg via INTRAMUSCULAR

## 2015-04-18 NOTE — Progress Notes (Signed)
Follow-up Note  Referring Provider: Dianne Dun, MD Primary Provider: Ruthe Mannan, MD Date of Office Visit: 04/18/2015  Subjective:   Anna Torres (DOB: 20-Nov-1970) is a 45 y.o. female who [facility] in re-evaluation of the following:  HPI Comments: Bellamarie returns to this clinic in evaluation of her asthma and allergic rhinitis and reflux-induced respiratory disease. I've not seen her in his clinic since December 2016. During the interval she is done very well up until approximately one or 2 weeks ago at which time she developed some shortness of breath when she exerted herself without any other significant symptoms. She denies any fever or chest pain or sputum production or significant nasal issues or problems with her reflux or problems with her legs regarding swelling or pain.. She's been consistently using her medical therapy which includes a combination of various anti-inflammatory medications for her respiratory tract and aggressive therapy directed against her reflux.  Osiris also appears to be having problems with food getting stuck in her chest. When she gets a pressure sensation that appears to occur on a daily basis with about 1 out of every 3 meals. In the past she has had a study demonstrating significant esophageal dysmotility but on follow-up with formal manometry she had intermittent peristaltic weakness but for the most part the study appeared to be relatively intact. She is wondering whether or not there is some type of treatment that she can utilize to help the situation along. She really has no classic reflux symptoms at this point on her current therapy.     Medication List           acyclovir 400 MG tablet  Commonly known as:  ZOVIRAX  TAKE ONE (1) TABLET BY MOUTH EVERY OTHERDAY     beclomethasone 80 MCG/ACT inhaler  Commonly known as:  QVAR  Inhale 1 puff into the lungs 2 (two) times daily.     fexofenadine  180 MG tablet  Commonly known as:  ALLEGRA  Take 180 mg by mouth daily.     glucosamine-chondroitin 500-400 MG tablet  Take 1 tablet by mouth daily.     montelukast 10 MG tablet  Commonly known as:  SINGULAIR  Take 1 tablet (10 mg total) by mouth at bedtime.     norgestimate-ethinyl estradiol 0.25-35 MG-MCG tablet  Commonly known as:  ORTHO-CYCLEN (28)  Take 1 tablet by mouth daily.     pantoprazole 40 MG tablet  Commonly known as:  PROTONIX  Take 40 mg by mouth daily.     PROAIR HFA 108 (90 Base) MCG/ACT inhaler  Generic drug:  albuterol  Inhale 2 puffs into the lungs every 4 (four) hours as needed for wheezing or shortness of breath.     ranitidine 300 MG tablet  Commonly known as:  ZANTAC  Take 1 tablet (300 mg total) by mouth at bedtime.     RHINOCORT ALLERGY 32 MCG/ACT nasal spray  Generic drug:  budesonide  Place 1 spray into both nostrils daily.     Vitamin D3 2000 units Tabs  Take 1 tablet by mouth daily.        Past Medical History  Diagnosis Date  . CIN I (cervical intraepithelial neoplasia I)   . Vitamin D deficiency   . Ureteral reflux 1975  . Asthma   . Environmental and seasonal allergies   . Herpes genitalis in women   . GERD (gastroesophageal reflux disease)   .  Asthma     last episode january 2015  . Headache     Past Surgical History  Procedure Laterality Date  . Mandible fracture surgery  1990    retained hardware "wires"  . External ear surgery      bilateral "pinning" age 19  . Dilation and curettage of uterus    . Dilatation & curettage/hysteroscopy with trueclear      3'14-endometrial polyp-[facility]  . Back surgery Bilateral 2001    C4-C5 DISK "fusion"  . Esophageal manometry N/A 05/24/2013    Procedure: ESOPHAGEAL MANOMETRY (EM);  Surgeon: Willis Modena, MD;  Location: [facility];  Service: Endoscopy;  Laterality: N/A;  . Esophagogastroduodenoscopy (egd) with propofol N/A 05/24/2013    Procedure: ESOPHAGOGASTRODUODENOSCOPY (EGD)  WITH PROPOFOL;  Surgeon: Willis Modena, MD;  Location: [facility];  Service: Endoscopy;  Laterality: N/A;  . Mandible surgery    . Esophagogastroduodenoscopy    . Cervical spine surgery    . Anterior cervical decomp/discectomy fusion N/A 01/31/2014    Procedure: ANTERIOR CERVICAL DECOMPRESSION/DISCECTOMY FUSION CERVICAL FIVE-SIX,CERVICAL SIX-SEVEN WITH HARDWARE REMOVAL OF CODMAN PLATE AT CERVIAL FOUR-FIVE;  Surgeon: Mariam Dollar, MD;  Location: [facility] OR;  Service: Neurosurgery;  Laterality: N/A;    Allergies  Allergen Reactions  . Codeine Nausea And Vomiting  . Hydrocodone Nausea And Vomiting  . Oxycodone Nausea And Vomiting  . Sumatriptan Other (See Comments)    Heart palpitations,SOB, chest pressure and shakiness    Review of systems negative except as noted in HPI / PMHx or noted below:  Review of Systems  Constitutional: Negative.   HENT: Negative.   Eyes: Negative.   Respiratory: Negative.   Cardiovascular: Negative.   Gastrointestinal: Negative.   Genitourinary: Negative.   Musculoskeletal: Negative.   Skin: Negative.   Neurological: Negative.   Endo/Heme/Allergies: Negative.   Psychiatric/Behavioral: Negative.      Objective:   Filed Vitals:   04/18/15 1113  BP: 110/66  Pulse: 80  Resp: 20          Physical Exam  Constitutional: She is well-developed, well-nourished, and in no distress.  HENT:  Head: Normocephalic.  Right Ear: Tympanic membrane, external ear and ear canal normal.  Left Ear: Tympanic membrane, external ear and ear canal normal.  Nose: Nose normal. No mucosal edema or rhinorrhea.  Mouth/Throat: Uvula is midline, oropharynx is clear and moist and mucous membranes are normal. No oropharyngeal exudate.  Eyes: Conjunctivae are normal.  Neck: Trachea normal. No tracheal tenderness present. No tracheal deviation present. No thyromegaly present.  Cardiovascular: Normal rate, regular rhythm, S1 normal, S2 normal and normal heart sounds.   No murmur  heard. Pulmonary/Chest: Breath sounds normal. No stridor. No respiratory distress. She has no wheezes. She has no rales.  Musculoskeletal: She exhibits no edema.  Lymphadenopathy:       Head (right side): No tonsillar adenopathy present.       Head (left side): No tonsillar adenopathy present.    She has no cervical adenopathy.    She has no axillary adenopathy.  Neurological: She is alert. Gait normal.  Skin: No rash noted. She is not diaphoretic. No erythema. Nails show no clubbing.  Psychiatric: Mood and affect normal.    Diagnostics:    Spirometry was performed and demonstrated an FEV1 of 1.89 at 73 % of predicted.  The patient had an Asthma Control Test with the following results: ACT Total Score: 18.    Assessment and Plan:   1. Asthma, not well controlled, moderate persistent,  with acute exacerbation   2. Allergic rhinoconjunctivitis   3. LPRD (laryngopharyngeal reflux disease)   4. Esophageal dysmotility     1. Continue Qvar 80 2 inhalations daily  2. Continue Rhinocort one spray each nostril 3-7 times per week  3. Continue montelukast 10 mg tablet 1 time per day  4. Continue pantoprazole 40 mg in the morning plus ranitidine 300 mg in the evening  5. Continue ProAir HFA and antihistamine if needed  6. Continue action plan for asthma flare including high dose Qvar at 3 inhalations 3 times per day  7. Depo-Medrol 80 IM delivered in clinic today  8. Follow-up with gastroenterologist concerning further management of esophageal dysmotility  9. Return to clinic in 6 months or earlier if problem  I will treat Lameeka with a systemic steroid to address what appears to be some possible inflammation in her respiratory tract and she'll keep in contact with me noting her response to this approach. Certainly she continues to have problems in the face of this therapy she will require further evaluation and treatment. I've encouraged her to touch base with her gastroenterologist  concerning further management of her dysmotility issue as she may benefit from the administration of a promotility agent.  Laurette Schimke, MD [facility]

## 2015-04-18 NOTE — Patient Instructions (Signed)
  1. Continue Qvar 80 2 inhalations daily  2. Continue Rhinocort one spray each nostril 3-7 times per week  3. Continue montelukast 10 mg tablet 1 time per day  4. Continue pantoprazole 40 mg in the morning plus ranitidine 300 mg in the evening  5. Continue ProAir HFA and antihistamine if needed  6. Continue action plan for asthma flare including high dose Qvar at 3 inhalations 3 times per day  7. Depo-Medrol 80 IM delivered in clinic today  8. Follow-up with gastroenterologist concerning further management of esophageal dysmotility  9. Return to clinic in 6 months or earlier if problem

## 2015-04-19 ENCOUNTER — Other Ambulatory Visit: Payer: Self-pay | Admitting: Allergy and Immunology

## 2015-04-19 ENCOUNTER — Other Ambulatory Visit: Payer: Self-pay | Admitting: Family Medicine

## 2015-04-25 ENCOUNTER — Ambulatory Visit: Payer: BLUE CROSS/BLUE SHIELD | Admitting: Licensed Clinical Social Worker

## 2015-05-17 ENCOUNTER — Ambulatory Visit (INDEPENDENT_AMBULATORY_CARE_PROVIDER_SITE_OTHER): Payer: BLUE CROSS/BLUE SHIELD | Admitting: Licensed Clinical Social Worker

## 2015-05-17 DIAGNOSIS — F329 Major depressive disorder, single episode, unspecified: Secondary | ICD-10-CM

## 2015-05-29 ENCOUNTER — Other Ambulatory Visit: Payer: Self-pay | Admitting: Gastroenterology

## 2015-05-29 DIAGNOSIS — R1310 Dysphagia, unspecified: Secondary | ICD-10-CM

## 2015-05-29 DIAGNOSIS — K921 Melena: Secondary | ICD-10-CM | POA: Diagnosis not present

## 2015-05-30 ENCOUNTER — Ambulatory Visit
Admission: RE | Admit: 2015-05-30 | Discharge: 2015-05-30 | Disposition: A | Discharge status: Home / Self Care | Payer: BLUE CROSS/BLUE SHIELD | Source: Ambulatory Visit | Attending: Gastroenterology | Admitting: Gastroenterology

## 2015-05-30 DIAGNOSIS — K219 Gastro-esophageal reflux disease without esophagitis: Secondary | ICD-10-CM | POA: Diagnosis not present

## 2015-05-30 DIAGNOSIS — R1310 Dysphagia, unspecified: Secondary | ICD-10-CM | POA: Diagnosis not present

## 2015-05-30 DIAGNOSIS — K449 Diaphragmatic hernia without obstruction or gangrene: Secondary | ICD-10-CM | POA: Diagnosis not present

## 2015-06-02 ENCOUNTER — Telehealth: Payer: Self-pay

## 2015-06-02 NOTE — Telephone Encounter (Signed)
Pt had a swollow test on 05/29/2015, the results are in epic.  She has a few concerns.  Does she need to increase her reflux or asthma meds based off of the test results. Pt was told she has reflux and that's why she has pressure in her esophagus. It seems to affect her asthma.   Please Advise Dr. Lucie Leather   Thanks

## 2015-06-05 NOTE — Telephone Encounter (Signed)
Please inform patient that I reviewed the study and it looks better than her last study. If she is having problems with her breathing we may need to change her plan. If here problem is primarily a swallowing issue then she may need to have this addressed by her GI doctor. She can see me if any problems.

## 2015-06-05 NOTE — Telephone Encounter (Signed)
Spoke to patient and she stated that she will follow up with her GI doctor and she is going to schedule and appointment with Dr. Lucie Leather to follow up in her asthma.

## 2015-06-12 ENCOUNTER — Ambulatory Visit (INDEPENDENT_AMBULATORY_CARE_PROVIDER_SITE_OTHER): Payer: BLUE CROSS/BLUE SHIELD | Admitting: Licensed Clinical Social Worker

## 2015-06-12 DIAGNOSIS — F438 Other reactions to severe stress: Secondary | ICD-10-CM | POA: Diagnosis not present

## 2015-06-13 ENCOUNTER — Ambulatory Visit (INDEPENDENT_AMBULATORY_CARE_PROVIDER_SITE_OTHER): Payer: BLUE CROSS/BLUE SHIELD | Admitting: Allergy and Immunology

## 2015-06-13 ENCOUNTER — Encounter: Payer: Self-pay | Admitting: Allergy and Immunology

## 2015-06-13 VITALS — BP 104/80 | HR 80 | Resp 16

## 2015-06-13 DIAGNOSIS — J309 Allergic rhinitis, unspecified: Secondary | ICD-10-CM

## 2015-06-13 DIAGNOSIS — J4541 Moderate persistent asthma with (acute) exacerbation: Secondary | ICD-10-CM | POA: Diagnosis not present

## 2015-06-13 DIAGNOSIS — H1010 Acute atopic conjunctivitis, unspecified eye: Secondary | ICD-10-CM

## 2015-06-13 DIAGNOSIS — K219 Gastro-esophageal reflux disease without esophagitis: Secondary | ICD-10-CM

## 2015-06-13 DIAGNOSIS — J387 Other diseases of larynx: Secondary | ICD-10-CM

## 2015-06-13 MED ORDER — BUDESONIDE-FORMOTEROL FUMARATE 160-4.5 MCG/ACT IN AERO: INHALATION_SPRAY | RESPIRATORY_TRACT | Status: DC

## 2015-06-13 NOTE — Patient Instructions (Addendum)
  1. Change Qvar to Symbicort 160 - 2 inhalations twice a day  2. Continue Rhinocort one spray each nostril 3-7 times per week  3. Continue montelukast 10 mg tablet 1 time per day  4. Continue pantoprazole 40 mg in the morning plus ranitidine 300 mg in the evening  5. Continue ProAir HFA and antihistamine if needed  6. Continue action plan for asthma flare including adding high dose Qvar at 3 inhalations 3 times per day to Symbicort  7. No caffeine, no chocolate, eat small meals, lose weight  8. Consider additional reflux medicines and Nissen fundoplication  9. Check heart rate during periods of chest pressure and shortness of breath  10. Return to clinic in 4 weeks or earlier if problem

## 2015-06-13 NOTE — Progress Notes (Signed)
Follow-up Note  Referring Provider: Dianne Dun, MD Primary Provider: Ruthe Mannan, MD Date of Office Visit: 06/13/2015  Subjective:   Anna Torres (DOB: 10/09/1970) is a 45 y.o. female [facility] in re-evaluation of the following:  HPI: Anna Torres returns to this clinic in evaluation of her asthma and allergic rhinitis and reflux-induced respiratory disease.  She still having some problems. She has this non-exertional intermittent chest pressure and shortness of breath of relatively acute onset lasting approximately 30 minutes and may occur a few times per week even in the face of utilizing her medical therapy which at this point in time includes Qvar and montelukast. She has tried a short acting bronchodilator which has not helped her regarding resolution of the symptoms. She does not think that her heart rate is fast while this is occurring but she never really formally checked her heart rate during these episodes. When she does not have these episodes her breathing is perfect and she has no problems at all with coughing or wheezing.  She still continues to have problems with her reflux. She still has regurgitation after eating and feels nauseated after eating for hours. She did have a recent barium swallow which identified very good esophageal function but bad reflux. She scheduled to see Dr. Dulce Sellar concerning this issue this week. She continues on her proton pump inhibitor and H2 receptor blocker.  She is not really been having any problems with her nose at this point in time.    Medication List           acyclovir 400 MG tablet  Commonly known as:  ZOVIRAX  TAKE ONE (1) TABLET BY MOUTH EVERY OTHERDAY     beclomethasone 80 MCG/ACT inhaler  Commonly known as:  QVAR  Inhale 1 puff into the lungs 2 (two) times daily.     fexofenadine 180 MG tablet  Commonly known as:  ALLEGRA  Take 180 mg by mouth daily.     glucosamine-chondroitin 500-400 MG tablet  Take 1 tablet by mouth daily.     montelukast 10 MG tablet  Commonly known as:  SINGULAIR  Take 1 tablet (10 mg total) by mouth at bedtime.     norgestimate-ethinyl estradiol 0.25-35 MG-MCG tablet  Commonly known as:  ORTHO-CYCLEN (28)  Take 1 tablet by mouth daily.     pantoprazole 40 MG tablet  Commonly known as:  PROTONIX  TAKE 1 TABLET BY MOUTH DAILY FOR REFLUX     PROAIR HFA 108 (90 Base) MCG/ACT inhaler  Generic drug:  albuterol  Inhale 2 puffs into the lungs every 4 (four) hours as needed for wheezing or shortness of breath.     ranitidine 300 MG tablet  Commonly known as:  ZANTAC  Take 1 tablet (300 mg total) by mouth at bedtime.     RHINOCORT ALLERGY 32 MCG/ACT nasal spray  Generic drug:  budesonide  Place 1 spray into both nostrils daily.     Vitamin D3 2000 units Tabs  Take 1 tablet by mouth daily.        Past Medical History  Diagnosis Date  . CIN I (cervical intraepithelial neoplasia I)   . Vitamin D deficiency   . Ureteral reflux 1975  . Asthma   . Environmental and seasonal allergies   . Herpes genitalis in women   . GERD (gastroesophageal reflux disease)   . Asthma     last episode january 2015  .  Headache     Past Surgical History  Procedure Laterality Date  . Mandible fracture surgery  1990    retained hardware "wires"  . External ear surgery      bilateral "pinning" age 48  . Dilation and curettage of uterus    . Dilatation & curettage/hysteroscopy with trueclear      3'14-endometrial polyp-[facility]  . Back surgery Bilateral 2001    C4-C5 DISK "fusion"  . Esophageal manometry N/A 05/24/2013    Procedure: ESOPHAGEAL MANOMETRY (EM);  Surgeon: Willis Modena, MD;  Location: [facility];  Service: Endoscopy;  Laterality: N/A;  . Esophagogastroduodenoscopy (egd) with propofol N/A 05/24/2013    Procedure: ESOPHAGOGASTRODUODENOSCOPY (EGD) WITH PROPOFOL;  Surgeon: Willis Modena, MD;  Location: [facility];   Service: Endoscopy;  Laterality: N/A;  . Mandible surgery    . Esophagogastroduodenoscopy    . Cervical spine surgery    . Anterior cervical decomp/discectomy fusion N/A 01/31/2014    Procedure: ANTERIOR CERVICAL DECOMPRESSION/DISCECTOMY FUSION CERVICAL FIVE-SIX,CERVICAL SIX-SEVEN WITH HARDWARE REMOVAL OF CODMAN PLATE AT CERVIAL FOUR-FIVE;  Surgeon: Mariam Dollar, MD;  Location: [facility] OR;  Service: Neurosurgery;  Laterality: N/A;    Allergies  Allergen Reactions  . Codeine Nausea And Vomiting  . Hydrocodone Nausea And Vomiting  . Oxycodone Nausea And Vomiting  . Sumatriptan Other (See Comments)    Heart palpitations,SOB, chest pressure and shakiness    Review of systems negative except as noted in HPI / PMHx or noted below:  Review of Systems  Constitutional: Negative.   HENT: Negative.   Eyes: Negative.   Respiratory: Negative.   Cardiovascular: Negative.   Gastrointestinal: Negative.   Genitourinary: Negative.   Musculoskeletal: Negative.   Skin: Negative.   Neurological: Negative.   Endo/Heme/Allergies: Negative.   Psychiatric/Behavioral: Negative.      Objective:   Filed Vitals:   06/13/15 1745  BP: 104/80  Pulse: 80  Resp: 16          Physical Exam  Constitutional: She is well-developed, well-nourished, and in no distress.  HENT:  Head: Normocephalic. Head is without right periorbital erythema and without left periorbital erythema.  Right Ear: Tympanic membrane, external ear and ear canal normal.  Left Ear: Tympanic membrane, external ear and ear canal normal.  Nose: Nose normal. No mucosal edema or rhinorrhea.  Mouth/Throat: Oropharynx is clear and moist and mucous membranes are normal. No oropharyngeal exudate.  Eyes: Conjunctivae and lids are normal. Pupils are equal, round, and reactive to light.  Neck: Trachea normal. No tracheal deviation present. No thyromegaly present.  Cardiovascular: Normal rate, regular rhythm, S1 normal, S2 normal and normal heart  sounds.   No murmur heard. Pulmonary/Chest: Effort normal. No stridor. No tachypnea. No respiratory distress. She has no wheezes. She has no rales. She exhibits no tenderness.  Abdominal: Soft.  Musculoskeletal: She exhibits no edema or tenderness.  Lymphadenopathy:       Head (right side): No tonsillar adenopathy present.       Head (left side): No tonsillar adenopathy present.    She has no cervical adenopathy.    She has no axillary adenopathy.  Neurological: She is alert. Gait normal.  Skin: No rash noted. She is not diaphoretic. No erythema. No pallor. Nails show no clubbing.  Psychiatric: Mood and affect normal.    Diagnostics:    Spirometry was performed and demonstrated an FEV1 of 2.0 at 77 % of predicted.  The patient had an Asthma Control Test with the following results: ACT Total Score: 21.  Assessment and Plan:   1. Asthma, not well controlled, moderate persistent, with acute exacerbation   2. Allergic rhinoconjunctivitis   3. LPRD (laryngopharyngeal reflux disease)     1. Change Qvar to Symbicort 160 - 2 inhalations twice a day  2. Continue Rhinocort one spray each nostril 3-7 times per week  3. Continue montelukast 10 mg tablet 1 time per day  4. Continue pantoprazole 40 mg in the morning plus ranitidine 300 mg in the evening  5. Continue ProAir HFA and antihistamine if needed  6. Continue action plan for asthma flare including adding high dose Qvar at 3 inhalations 3 times per day to Symbicort  7. No caffeine, no chocolate, eat small meals, lose weight  8. Consider additional reflux medicines and Nissen fundoplication  9. Check heart rate during periods of chest pressure and shortness of breath  10. Return to clinic in 4 weeks or earlier if problem  Aariyana has several issues that are still somewhat of a problem. First, she has these episodes of chest pressure and shortness of breath which could be secondary to asthma or cardiac in origin or possibly  secondary to her reflux. I will step up her therapy for asthma as noted above and I've asked her to check her heart rate during periods when she is having these episodes to make sure we are not dealing with a arrhythmia and I will have her follow-up with Dr. Dulce Sellar concerning further management of her reflux disease. I did have a long talk with her today about her reflux and I made suggestions such as no caffeine and no chocolate eating small meals and attempting to lose weight. As well, I did discuss with her the possibility that Dr. Dulce Sellar may want to change some of her reflux medicines and possibly have a discussion with her about a Nissen fundoplication given the fact that her reflux is pretty significant. I'll regroup with her in approximately 4 weeks or earlier if there is a problem.  Laurette Schimke, MD [facility]

## 2015-06-16 ENCOUNTER — Other Ambulatory Visit: Payer: Self-pay

## 2015-06-16 DIAGNOSIS — K219 Gastro-esophageal reflux disease without esophagitis: Secondary | ICD-10-CM | POA: Diagnosis not present

## 2015-06-16 MED ORDER — MONTELUKAST SODIUM 10 MG PO TABS: 10.0000 mg | ORAL_TABLET | Freq: Every day | ORAL | Status: DC

## 2015-07-11 ENCOUNTER — Encounter: Payer: Self-pay | Admitting: Allergy and Immunology

## 2015-07-11 ENCOUNTER — Ambulatory Visit (INDEPENDENT_AMBULATORY_CARE_PROVIDER_SITE_OTHER): Payer: BLUE CROSS/BLUE SHIELD | Admitting: Allergy and Immunology

## 2015-07-11 ENCOUNTER — Ambulatory Visit (INDEPENDENT_AMBULATORY_CARE_PROVIDER_SITE_OTHER): Payer: BLUE CROSS/BLUE SHIELD | Admitting: Licensed Clinical Social Worker

## 2015-07-11 VITALS — BP 102/72 | HR 104 | Resp 22 | Ht 61.38 in | Wt 132.2 lb

## 2015-07-11 DIAGNOSIS — J309 Allergic rhinitis, unspecified: Secondary | ICD-10-CM

## 2015-07-11 DIAGNOSIS — J387 Other diseases of larynx: Secondary | ICD-10-CM

## 2015-07-11 DIAGNOSIS — F3341 Major depressive disorder, recurrent, in partial remission: Secondary | ICD-10-CM | POA: Diagnosis not present

## 2015-07-11 DIAGNOSIS — J4540 Moderate persistent asthma, uncomplicated: Secondary | ICD-10-CM | POA: Diagnosis not present

## 2015-07-11 DIAGNOSIS — H1010 Acute atopic conjunctivitis, unspecified eye: Secondary | ICD-10-CM

## 2015-07-11 DIAGNOSIS — K219 Gastro-esophageal reflux disease without esophagitis: Secondary | ICD-10-CM

## 2015-07-11 NOTE — Progress Notes (Signed)
Follow-up Note  Referring Provider: Dianne Dun, MD Primary Provider: Ruthe Mannan, MD Date of Office Visit: 07/11/2015  Subjective:   Anna Torres (DOB: [birth year]) is a 45 y.o. female who [facility] in re-evaluation of the following:  HPI: Anna Torres returns to this clinic in reevaluation of her asthma and allergic rhinitis and reflux.  Since starting medical therapy administered on 06/14/2015 she has basically resolved a lot of the issues revolving around her chest pressure and shortness of breath. She does not think that asthma is a problem now with consistent use of her Symbicort to replace her inhaled steroid. She does not need to use a short acting bronchodilator.  Her nose has not been causing her problem.  Her reflux is still an issue. She still has regurgitation and gets nauseated. She did see Dr. Dulce Sellar who prescribed Dexilant to replace her proton pump inhibitor and she's not really sure that this is helped her very much.    Medication List           acyclovir 400 MG tablet  Commonly known as:  ZOVIRAX  TAKE ONE (1) TABLET BY MOUTH EVERY OTHERDAY     budesonide-formoterol 160-4.5 MCG/ACT inhaler  Commonly known as:  SYMBICORT  INHALE TWO PUFFS TWICE DAILY TO PREVENT COUGH OR WHEEZE     DEXILANT 60 MG capsule  Generic drug:  dexlansoprazole  Take 60 mg by mouth daily.     fexofenadine 180 MG tablet  Commonly known as:  ALLEGRA  Take 180 mg by mouth daily.     glucosamine-chondroitin 500-400 MG tablet  Take 1 tablet by mouth daily.     montelukast 10 MG tablet  Commonly known as:  SINGULAIR  Take 1 tablet (10 mg total) by mouth at bedtime.     norgestimate-ethinyl estradiol 0.25-35 MG-MCG tablet  Commonly known as:  ORTHO-CYCLEN (28)  Take 1 tablet by mouth daily.     PROAIR HFA 108 (90 Base) MCG/ACT inhaler  Generic drug:  albuterol  Inhale 2 puffs into the lungs every 4 (four) hours as needed for  wheezing or shortness of breath.     QVAR 80 MCG/ACT inhaler  Generic drug:  beclomethasone  Inhale 3 puffs into the lungs 3 (three) times daily. During asthma flare-up. Rinse, gargle, and spit after use.     ranitidine 300 MG tablet  Commonly known as:  ZANTAC  Take 1 tablet (300 mg total) by mouth at bedtime.     RHINOCORT ALLERGY 32 MCG/ACT nasal spray  Generic drug:  budesonide  Place 1 spray into both nostrils daily.     Vitamin D3 2000 units Tabs  Take 1 tablet by mouth daily.        Past Medical History  Diagnosis Date  . CIN I (cervical intraepithelial neoplasia I)   . Vitamin D deficiency   . Ureteral reflux 1975  . Asthma   . Environmental and seasonal allergies   . Herpes genitalis in women   . GERD (gastroesophageal reflux disease)   . Asthma     last episode january 2015  . Headache     Past Surgical History  Procedure Laterality Date  . Mandible fracture surgery  1990    retained hardware "wires"  . External ear surgery      bilateral "pinning" age 60  . Dilation and curettage of uterus    . Dilatation & curettage/hysteroscopy with trueclear  3'14-endometrial polyp-[facility]  . Back surgery Bilateral 2001    C4-C5 DISK "fusion"  . Esophageal manometry N/A 05/24/2013    Procedure: ESOPHAGEAL MANOMETRY (EM);  Surgeon: Willis Modena, MD;  Location: [facility];  Service: Endoscopy;  Laterality: N/A;  . Esophagogastroduodenoscopy (egd) with propofol N/A 05/24/2013    Procedure: ESOPHAGOGASTRODUODENOSCOPY (EGD) WITH PROPOFOL;  Surgeon: Willis Modena, MD;  Location: [facility];  Service: Endoscopy;  Laterality: N/A;  . Mandible surgery    . Esophagogastroduodenoscopy    . Cervical spine surgery    . Anterior cervical decomp/discectomy fusion N/A 01/31/2014    Procedure: ANTERIOR CERVICAL DECOMPRESSION/DISCECTOMY FUSION CERVICAL FIVE-SIX,CERVICAL SIX-SEVEN WITH HARDWARE REMOVAL OF CODMAN PLATE AT CERVIAL FOUR-FIVE;  Surgeon: Mariam Dollar, MD;  Location: [facility]  OR;  Service: Neurosurgery;  Laterality: N/A;    Allergies  Allergen Reactions  . Codeine Nausea And Vomiting  . Hydrocodone Nausea And Vomiting  . Oxycodone Nausea And Vomiting  . Sumatriptan Other (See Comments)    Heart palpitations,SOB, chest pressure and shakiness    Review of systems negative except as noted in HPI / PMHx or noted below:  Review of Systems  Constitutional: Negative.   HENT: Negative.   Eyes: Negative.   Respiratory: Negative.   Cardiovascular: Negative.   Gastrointestinal: Negative.   Genitourinary: Negative.   Musculoskeletal: Negative.   Skin: Negative.   Neurological: Negative.   Endo/Heme/Allergies: Negative.   Psychiatric/Behavioral: Negative.      Objective:   Filed Vitals:   07/11/15 1117  BP: 102/72  Pulse: 104  Resp: 22   Height: 5' 1.38" (155.9 cm)  Weight: 132 lb 3.2 oz (59.966 kg)   Physical Exam  Constitutional: She is well-developed, well-nourished, and in no distress.  HENT:  Head: Normocephalic.  Right Ear: Tympanic membrane, external ear and ear canal normal.  Left Ear: Tympanic membrane, external ear and ear canal normal.  Nose: Nose normal. No mucosal edema or rhinorrhea.  Mouth/Throat: Uvula is midline, oropharynx is clear and moist and mucous membranes are normal. No oropharyngeal exudate.  Eyes: Conjunctivae are normal.  Neck: Trachea normal. No tracheal tenderness present. No tracheal deviation present. No thyromegaly present.  Cardiovascular: Normal rate, regular rhythm, S1 normal, S2 normal and normal heart sounds.   No murmur heard. Pulmonary/Chest: Breath sounds normal. No stridor. No respiratory distress. She has no wheezes. She has no rales.  Musculoskeletal: She exhibits no edema.  Lymphadenopathy:       Head (right side): No tonsillar adenopathy present.       Head (left side): No tonsillar adenopathy present.    She has no cervical adenopathy.  Neurological: She is alert. Gait normal.  Skin: No rash  noted. She is not diaphoretic. No erythema. Nails show no clubbing.  Psychiatric: Mood and affect normal.    Diagnostics:    Spirometry was performed and demonstrated an FEV1 of 1.91 at 76 % of predicted.  The patient had an Asthma Control Test with the following results: ACT Total Score: 23.    Assessment and Plan:   1. Asthma, moderate persistent, well-controlled   2. Allergic rhinoconjunctivitis   3. LPRD (laryngopharyngeal reflux disease)     1. Continue Symbicort 160 - 2 inhalations twice a day  2. Continue Rhinocort one spray each nostril 3-7 times per week  3. Continue montelukast 10 mg tablet 1 time per day  4. Continue proton pump inhibitor in the morning plus ranitidine 300 mg in the evening  5. Continue ProAir HFA and antihistamine if  needed  6. Continue action plan for asthma flare including adding high dose Qvar at 3 inhalations 3 times per day to Symbicort  7. No caffeine, no chocolate, eat small meals, lose weight  8. Follow-up with Dr. Dulce Sellar for reflux therapy  9. Return to clinic in November 2017 or earlier if problem  10. Obtain fall flu vaccine  Shavone will continue to use medical therapy directed against inflammation of her upper and lower airway which appears to be working quite well and I see no need for changing this plan at this point. I once again had a talk with her today about the need to eliminate all caffeine and chocolate and lose weight regarding her reflux disease and she will need to follow up regarding further management of this issue with Dr. Dulce Sellar. She does have an action plan to initiate should she develop a significant problem with asthma and she moves forward and I'll certainly be available to see her back in this clinic should she develop a significant problem but otherwise I will see her back in this clinic in November or earlier if there is an issue.  Laurette Schimke, MD [facility]

## 2015-07-11 NOTE — Patient Instructions (Addendum)
  1. Continue Symbicort 160 - 2 inhalations twice a day  2. Continue Rhinocort one spray each nostril 3-7 times per week  3. Continue montelukast 10 mg tablet 1 time per day  4. Continue proton pump inhibitor in the morning plus ranitidine 300 mg in the evening  5. Continue ProAir HFA and antihistamine if needed  6. Continue action plan for asthma flare including adding high dose Qvar at 3 inhalations 3 times per day to Symbicort  7. No caffeine, no chocolate, eat small meals, lose weight  8. Follow-up with Dr. Dulce Sellar for reflux therapy  9. Return to clinic in November 2017 or earlier if problem  10. Obtain fall flu vaccine

## 2015-07-12 ENCOUNTER — Telehealth: Payer: Self-pay | Admitting: Family Medicine

## 2015-07-12 NOTE — Telephone Encounter (Signed)
Pt dropped off copy of old vaccination card.  Placing on cart  Thanks

## 2015-07-27 ENCOUNTER — Other Ambulatory Visit: Payer: Self-pay | Admitting: *Deleted

## 2015-07-27 MED ORDER — RANITIDINE HCL 300 MG PO TABS: 300.0000 mg | ORAL_TABLET | Freq: Every day | ORAL | Status: DC

## 2015-08-03 DIAGNOSIS — K219 Gastro-esophageal reflux disease without esophagitis: Secondary | ICD-10-CM | POA: Diagnosis not present

## 2015-08-07 ENCOUNTER — Ambulatory Visit (INDEPENDENT_AMBULATORY_CARE_PROVIDER_SITE_OTHER): Payer: BLUE CROSS/BLUE SHIELD | Admitting: Licensed Clinical Social Worker

## 2015-08-07 DIAGNOSIS — F438 Other reactions to severe stress: Secondary | ICD-10-CM | POA: Diagnosis not present

## 2015-09-18 ENCOUNTER — Ambulatory Visit (HOSPITAL_COMMUNITY)
Admission: RE | Admit: 2015-09-18 | Discharge: 2015-09-18 | Disposition: A | Discharge status: Home / Self Care | Payer: BLUE CROSS/BLUE SHIELD | Source: Ambulatory Visit | Attending: Gastroenterology | Admitting: Gastroenterology

## 2015-09-18 ENCOUNTER — Encounter (HOSPITAL_COMMUNITY)
Admission: RE | Disposition: A | Discharge status: Home / Self Care | Payer: Self-pay | Source: Ambulatory Visit | Attending: Gastroenterology

## 2015-09-18 DIAGNOSIS — K219 Gastro-esophageal reflux disease without esophagitis: Secondary | ICD-10-CM | POA: Insufficient documentation

## 2015-09-18 HISTORY — PX: 24 HOUR PH STUDY: SHX5419

## 2015-09-18 SURGERY — MONITORING, ESOPHAGEAL PH, 24 HOUR

## 2015-09-18 MED ORDER — LIDOCAINE VISCOUS 2 % MT SOLN
OROMUCOSAL | Status: AC
  Filled 2015-09-18: qty 15

## 2015-09-18 SURGICAL SUPPLY — 2 items
FACESHIELD LNG OPTICON STERILE (SAFETY) IMPLANT
GLOVE BIO SURGEON STRL SZ8 (GLOVE) ×4 IMPLANT

## 2015-09-18 NOTE — Progress Notes (Signed)
Manometry originally scheduled, but it was noted that an Esophageal Manometry was done in 2015 with mostly normal results. LES location taken off of 2015 manometry. Dr. Dulce Sellar stated he was fine with not repeating the esophageal manometry and just doing the 24 hour pH.Ph probe with impedance placed at 35 per protocol. Pt tolerated fair with husband at side. No complications noted. Pt was very anxious but probe place without problem.  Ph 24 hour study and monitor teaching done with patient and husband using teach back. Questions answered. Pt to return to have probe removed and study downloaded 09/19/2015 at or after 1100 am. Report to be sent to Dr. Hulen Shouts office.

## 2015-09-20 ENCOUNTER — Encounter (HOSPITAL_COMMUNITY): Payer: Self-pay | Admitting: Gastroenterology

## 2015-09-27 ENCOUNTER — Other Ambulatory Visit: Payer: Self-pay | Admitting: Family Medicine

## 2015-10-05 ENCOUNTER — Other Ambulatory Visit: Payer: Self-pay | Admitting: Gastroenterology

## 2015-10-05 DIAGNOSIS — K219 Gastro-esophageal reflux disease without esophagitis: Secondary | ICD-10-CM | POA: Diagnosis not present

## 2015-10-05 DIAGNOSIS — R112 Nausea with vomiting, unspecified: Secondary | ICD-10-CM | POA: Diagnosis not present

## 2015-10-05 DIAGNOSIS — R110 Nausea: Secondary | ICD-10-CM

## 2015-10-05 DIAGNOSIS — R634 Abnormal weight loss: Secondary | ICD-10-CM | POA: Diagnosis not present

## 2015-10-05 DIAGNOSIS — R1033 Periumbilical pain: Secondary | ICD-10-CM | POA: Diagnosis not present

## 2015-10-05 DIAGNOSIS — R1084 Generalized abdominal pain: Secondary | ICD-10-CM

## 2015-10-16 ENCOUNTER — Ambulatory Visit (HOSPITAL_COMMUNITY)
Admission: RE | Admit: 2015-10-16 | Discharge: 2015-10-16 | Disposition: A | Discharge status: Home / Self Care | Payer: BLUE CROSS/BLUE SHIELD | Source: Ambulatory Visit | Attending: Gastroenterology | Admitting: Gastroenterology

## 2015-10-16 ENCOUNTER — Encounter (HOSPITAL_COMMUNITY): Payer: Self-pay | Admitting: Radiology

## 2015-10-16 DIAGNOSIS — R1084 Generalized abdominal pain: Secondary | ICD-10-CM | POA: Insufficient documentation

## 2015-10-16 DIAGNOSIS — R110 Nausea: Secondary | ICD-10-CM

## 2015-10-16 DIAGNOSIS — R1011 Right upper quadrant pain: Secondary | ICD-10-CM | POA: Diagnosis not present

## 2015-10-16 MED ORDER — TECHNETIUM TC 99M MEBROFENIN IV KIT
5.4000 | PACK | Freq: Once | INTRAVENOUS | Status: AC | PRN
  Administered 2015-10-16: 5 via INTRAVENOUS

## 2015-11-02 DIAGNOSIS — R140 Abdominal distension (gaseous): Secondary | ICD-10-CM | POA: Diagnosis not present

## 2015-11-02 DIAGNOSIS — K219 Gastro-esophageal reflux disease without esophagitis: Secondary | ICD-10-CM | POA: Diagnosis not present

## 2015-11-02 DIAGNOSIS — R110 Nausea: Secondary | ICD-10-CM | POA: Diagnosis not present

## 2015-11-07 DIAGNOSIS — K7581 Nonalcoholic steatohepatitis (NASH): Secondary | ICD-10-CM | POA: Diagnosis not present

## 2015-11-07 DIAGNOSIS — K219 Gastro-esophageal reflux disease without esophagitis: Secondary | ICD-10-CM | POA: Diagnosis not present

## 2015-11-08 ENCOUNTER — Encounter: Payer: Self-pay | Admitting: Family Medicine

## 2015-11-08 ENCOUNTER — Ambulatory Visit (INDEPENDENT_AMBULATORY_CARE_PROVIDER_SITE_OTHER): Payer: BLUE CROSS/BLUE SHIELD | Admitting: Family Medicine

## 2015-11-08 VITALS — BP 114/64 | HR 97 | Temp 97.0°F | Wt 125.0 lb

## 2015-11-08 DIAGNOSIS — H01004 Unspecified blepharitis left upper eyelid: Secondary | ICD-10-CM | POA: Diagnosis not present

## 2015-11-08 DIAGNOSIS — H01005 Unspecified blepharitis left lower eyelid: Secondary | ICD-10-CM

## 2015-11-08 DIAGNOSIS — H01001 Unspecified blepharitis right upper eyelid: Secondary | ICD-10-CM

## 2015-11-08 DIAGNOSIS — H01002 Unspecified blepharitis right lower eyelid: Secondary | ICD-10-CM

## 2015-11-08 DIAGNOSIS — H0100A Unspecified blepharitis right eye, upper and lower eyelids: Secondary | ICD-10-CM

## 2015-11-08 DIAGNOSIS — Z23 Encounter for immunization: Secondary | ICD-10-CM

## 2015-11-08 DIAGNOSIS — H01009 Unspecified blepharitis unspecified eye, unspecified eyelid: Secondary | ICD-10-CM | POA: Insufficient documentation

## 2015-11-08 DIAGNOSIS — H0100B Unspecified blepharitis left eye, upper and lower eyelids: Secondary | ICD-10-CM

## 2015-11-08 MED ORDER — ERYTHROMYCIN 5 MG/GM OP OINT: 1.0000 "application " | TOPICAL_OINTMENT | Freq: Three times a day (TID) | OPHTHALMIC | 0 refills | Status: DC

## 2015-11-08 NOTE — Progress Notes (Signed)
Pre visit review using our clinic review tool, if applicable. No additional management support is needed unless otherwise documented below in the visit note. 

## 2015-11-08 NOTE — Progress Notes (Signed)
Subjective:   Patient ID: Anna Torres, female    DOB: August 24, 1970, 45 y.o.   MRN: [id number]  Anna Torres is a pleasant 46 y.o. year old female who presents to clinic today with Belepharitis ("bump")  on 11/08/2015  HPI:  Bumps on her eye lids- Look like white heads, that come and go over past few months. Eye lids are itchy. No photophobia. No conjunctival injection. No discharge. No blurred vision.  She has been applying warm compresses.  Current Outpatient Prescriptions on File Prior to Visit  Medication Sig Dispense Refill  . acyclovir (ZOVIRAX) 400 MG tablet TAKE ONE (1) TABLET BY MOUTH EVERY OTHERDAY 30 tablet 5  . albuterol (PROAIR HFA) 108 (90 BASE) MCG/ACT inhaler Inhale 2 puffs into the lungs every 4 (four) hours as needed for wheezing or shortness of breath.    . budesonide (RHINOCORT ALLERGY) 32 MCG/ACT nasal spray Place 1 spray into both nostrils daily.    . budesonide-formoterol (SYMBICORT) 160-4.5 MCG/ACT inhaler INHALE TWO PUFFS TWICE DAILY TO PREVENT COUGH OR WHEEZE 1 Inhaler 5  . Cholecalciferol (VITAMIN D3) 2000 UNITS TABS Take 1 tablet by mouth daily.    Marland Kitchen dexlansoprazole (DEXILANT) 60 MG capsule Take 60 mg by mouth daily.    . fexofenadine (ALLEGRA) 180 MG tablet Take 180 mg by mouth daily.    Marland Kitchen glucosamine-chondroitin 500-400 MG tablet Take 1 tablet by mouth daily.    . montelukast (SINGULAIR) 10 MG tablet Take 1 tablet (10 mg total) by mouth at bedtime. 30 tablet 5  . norgestimate-ethinyl estradiol (SPRINTEC 28) 0.25-35 MG-MCG tablet Take 1 tablet by mouth daily. COMPLETE PHYSICAL EXAM REQUIRED FOR ADDITIONAL REFILLS 3 Package 0  . ranitidine (ZANTAC) 300 MG tablet Take 1 tablet (300 mg total) by mouth at bedtime. 30 tablet 5   No current facility-administered medications on file prior to visit.     Allergies  Allergen Reactions  . Codeine Nausea And Vomiting  . Hydrocodone Nausea And Vomiting  . Oxycodone Nausea And Vomiting  .  Sumatriptan Other (See Comments)    Heart palpitations,SOB, chest pressure and shakiness    Past Medical History:  Diagnosis Date  . Asthma   . Asthma    last episode january 2015  . CIN I (cervical intraepithelial neoplasia I)   . Environmental and seasonal allergies   . GERD (gastroesophageal reflux disease)   . Headache   . Herpes genitalis in women   . Ureteral reflux 1975  . Vitamin D deficiency     Past Surgical History:  Procedure Laterality Date  . 24 HOUR PH STUDY N/A 09/18/2015   Procedure: 24 HOUR PH STUDY;  Surgeon: Willis Modena, MD;  Location: [facility];  Service: Endoscopy;  Laterality: N/A;  . ANTERIOR CERVICAL DECOMP/DISCECTOMY FUSION N/A 01/31/2014   Procedure: ANTERIOR CERVICAL DECOMPRESSION/DISCECTOMY FUSION CERVICAL FIVE-SIX,CERVICAL SIX-SEVEN WITH HARDWARE REMOVAL OF CODMAN PLATE AT CERVIAL FOUR-FIVE;  Surgeon: Mariam Dollar, MD;  Location: [facility] OR;  Service: Neurosurgery;  Laterality: N/A;  . BACK SURGERY Bilateral 2001   C4-C5 DISK "fusion"  . CERVICAL SPINE SURGERY    . DILATATION & CURETTAGE/HYSTEROSCOPY WITH TRUECLEAR     3'14-endometrial polyp-[facility]  . DILATION AND CURETTAGE OF UTERUS    . ESOPHAGEAL MANOMETRY N/A 05/24/2013   Procedure: ESOPHAGEAL MANOMETRY (EM);  Surgeon: Willis Modena, MD;  Location: [facility];  Service: Endoscopy;  Laterality: N/A;  . ESOPHAGOGASTRODUODENOSCOPY    . ESOPHAGOGASTRODUODENOSCOPY (EGD) WITH PROPOFOL N/A 05/24/2013   Procedure: ESOPHAGOGASTRODUODENOSCOPY (EGD) WITH  PROPOFOL;  Surgeon: Willis Modena, MD;  Location: [facility];  Service: Endoscopy;  Laterality: N/A;  . EXTERNAL EAR SURGERY     bilateral "pinning" age 42  . MANDIBLE FRACTURE SURGERY  1990   retained hardware "wires"  . MANDIBLE SURGERY      Family History  Problem Relation Age of Onset  . Diabetes Mother   . Rheum arthritis Mother   . Arthritis Mother   . Cancer Mother   . Graves' disease Sister     Social History   Social History  .  Marital status: Married    Spouse name: N/A  . Number of children: N/A  . Years of education: N/A   Occupational History  . Not on file.   Social History Main Topics  . Smoking status: Never Smoker  . Smokeless tobacco: Never Used  . Alcohol use 0.0 oz/week     Comment: rare social  . Drug use: No  . Sexual activity: Yes    Birth control/ protection: Pill   Other Topics Concern  . Not on file   Social History Narrative   ** Merged History Encounter **       The PMH, PSH, Social History, Family History, Medications, and allergies have been reviewed in [facility], and have been updated if relevant.   Review of Systems  Eyes: Positive for itching. Negative for photophobia, pain, discharge, redness and visual disturbance.       Objective:    BP 114/64   Pulse 97   Temp 97 F (36.1 C) (Oral)   Wt 125 lb (56.7 kg)   LMP 10/25/2015   SpO2 97%   BMI 23.33 kg/m    Physical Exam  Constitutional: She is oriented to person, place, and time. She appears well-developed and well-nourished. No distress.  Eyes: EOM are normal. Pupils are equal, round, and reactive to light. Right eye exhibits no discharge and no exudate. Left eye exhibits no discharge and no exudate. Right conjunctiva is not injected. Left conjunctiva is not injected.  Several white bumps along lash line bilaterally, lower and upper lid, consistent with blepharitis   Cardiovascular: Normal rate.   Pulmonary/Chest: Effort normal.  Neurological: She is oriented to person, place, and time. No cranial nerve deficit.  Skin: Skin is warm and dry. She is not diaphoretic.  Psychiatric: She has a normal mood and affect. Her behavior is normal. Judgment and thought content normal.  Nursing note and vitals reviewed.         Assessment & Plan:   Need for influenza vaccination - Plan: Flu Vaccine QUAD 36+ mos PF IM (Fluarix & Fluzone Quad PF) No Follow-up on file.

## 2015-11-08 NOTE — Assessment & Plan Note (Signed)
New- supportive care as discussed in AVS. Given refractory nature, treat with topical erythromycin ointment as well. Call or return to clinic prn if these symptoms worsen or fail to improve as anticipated. The patient indicates understanding of these issues and agrees with the plan.

## 2015-11-08 NOTE — Patient Instructions (Signed)
Blepharitis Blepharitis is inflammation of the eyelids. Blepharitis may happen with:  Reddish, scaly skin around the scalp and eyebrows.  Burning or itching of the eyelids.  Eye discharge at night that causes the eyelashes to stick together in the morning.  Eyelashes that fall out.  Sensitivity to light. HOME CARE INSTRUCTIONS Pay attention to any changes in how you look or feel. Follow these instructions to help with your condition: Keeping Clean  Wash your hands often.  Wash your eyelids with warm water or with warm water that is mixed with a small amount of baby shampoo. Do this two times per day or as often as needed.  Wash your face and eyebrows at least once a day.  Use a clean towel each time you dry your eyelids. Do not use this towel to clean or dry other areas of your body. Do not share your towel with anyone. General Instructions  Avoid wearing makeup until you get better. Do not share makeup with anyone.  Avoid rubbing your eyes.  Apply warm compresses to your eyes 2 times per day for 10 minutes at a time, or as told by your health care provider.  If you were prescribed an antibiotic ointment or steroid drops, apply or use the medicine as told by your health care provider. Do not stop using the medicine even if you feel better.  Keep all follow-up visits as told by your health care provider. This is important. SEEK MEDICAL CARE IF:  Your eyelids feel hot.  You have blisters or a rash on your eyelids.  The condition does not go away in 2-4 days.  The inflammation gets worse. SEEK IMMEDIATE MEDICAL CARE IF:  You have pain or redness that gets worse or spreads to other parts of your face.  Your vision changes.  You have pain when looking at lights or moving objects.  You have a fever.   This information is not intended to replace advice given to you by your health care provider. Make sure you discuss any questions you have with your health care  provider.   Document Released: 01/05/2000 Document Revised: 09/28/2014 Document Reviewed: 05/02/2014 Elsevier Interactive Patient Education 2016 [employer] Inc[employer]  

## 2015-12-06 ENCOUNTER — Other Ambulatory Visit: Payer: Self-pay | Admitting: Family Medicine

## 2015-12-11 ENCOUNTER — Other Ambulatory Visit: Payer: Self-pay | Admitting: Family Medicine

## 2015-12-11 DIAGNOSIS — Z0001 Encounter for general adult medical examination with abnormal findings: Secondary | ICD-10-CM | POA: Insufficient documentation

## 2015-12-11 DIAGNOSIS — Z01419 Encounter for gynecological examination (general) (routine) without abnormal findings: Secondary | ICD-10-CM | POA: Insufficient documentation

## 2015-12-11 DIAGNOSIS — Z0000 Encounter for general adult medical examination without abnormal findings: Secondary | ICD-10-CM | POA: Insufficient documentation

## 2015-12-12 ENCOUNTER — Ambulatory Visit (INDEPENDENT_AMBULATORY_CARE_PROVIDER_SITE_OTHER): Payer: BLUE CROSS/BLUE SHIELD | Admitting: Allergy and Immunology

## 2015-12-12 ENCOUNTER — Encounter: Payer: Self-pay | Admitting: Allergy and Immunology

## 2015-12-12 VITALS — BP 98/78 | HR 96 | Resp 18

## 2015-12-12 DIAGNOSIS — J4540 Moderate persistent asthma, uncomplicated: Secondary | ICD-10-CM

## 2015-12-12 DIAGNOSIS — K224 Dyskinesia of esophagus: Secondary | ICD-10-CM | POA: Diagnosis not present

## 2015-12-12 DIAGNOSIS — H1010 Acute atopic conjunctivitis, unspecified eye: Secondary | ICD-10-CM

## 2015-12-12 DIAGNOSIS — K219 Gastro-esophageal reflux disease without esophagitis: Secondary | ICD-10-CM | POA: Diagnosis not present

## 2015-12-12 DIAGNOSIS — J309 Allergic rhinitis, unspecified: Secondary | ICD-10-CM | POA: Diagnosis not present

## 2015-12-12 NOTE — Progress Notes (Signed)
Follow-up Note  Referring Provider: Dianne Dun, MD Primary Provider: Ruthe Mannan, MD Date of Office Visit: 12/12/2015  Subjective:   Anna Torres (DOB: 1970/04/02) is a 45 y.o. female [facility] in re-evaluation of the following:  HPI: Anna Torres presents to this clinic in reevaluation of her asthma and allergic rhinitis and reflux. I've last seen her in his clinic in June 2017.  During the interval her asthma has been under excellent control. She has not been treated with a systemic steroids for an exacerbation and rarely uses a short acting bronchodilator and can exert herself without any problem.  She has had no problems with her nose and has not required any antibiotics to treat an episode of sinusitis.  She has had some intermittent squeaky voice where she will develop a very high-pitched voice for several minutes to hours but otherwise no other laryngeal symptoms.  She has seen Dr. Loreta Torres for her reflux disease now. Dr. Loreta Torres is attempting to taper her off her proton pump inhibitor to see if her reflux will flare. She still has obvious regurgitation on occasion. This is very common phenomenon occurring at least several times per week. She has lost approximately 10 pounds of weight in the past several months. She remains away from caffeine and chocolate.  She did receive the flu vaccine this year    Medication List      acyclovir 400 MG tablet Commonly known as:  ZOVIRAX TAKE ONE (1) TABLET BY MOUTH EVERY OTHERDAY   budesonide-formoterol 160-4.5 MCG/ACT inhaler Commonly known as:  SYMBICORT INHALE TWO PUFFS TWICE DAILY TO PREVENT COUGH OR WHEEZE   fexofenadine 180 MG tablet Commonly known as:  ALLEGRA Take 180 mg by mouth daily.   glucosamine-chondroitin 500-400 MG tablet Take 1 tablet by mouth daily.   montelukast 10 MG tablet Commonly known as:  SINGULAIR Take 1 tablet (10 mg total) by mouth at bedtime.     omeprazole 40 MG capsule Commonly known as:  PRILOSEC Take 40 mg by mouth every morning.   PROAIR HFA 108 (90 Base) MCG/ACT inhaler Generic drug:  albuterol Inhale 2 puffs into the lungs every 4 (four) hours as needed for wheezing or shortness of breath.   ranitidine 300 MG tablet Commonly known as:  ZANTAC Take 1 tablet (300 mg total) by mouth at bedtime.   RHINOCORT ALLERGY 32 MCG/ACT nasal spray Generic drug:  budesonide Place 1 spray into both nostrils daily.   SPRINTEC 28 0.25-35 MG-MCG tablet Generic drug:  norgestimate-ethinyl estradiol TAKE 1 TABLET BY MOUTH DAILY *MUST KEEP APPT FOR ANY FURTHER REFILLS       Past Medical History:  Diagnosis Date  . Asthma   . Asthma    last episode january 2015  . CIN I (cervical intraepithelial neoplasia I)   . Environmental and seasonal allergies   . GERD (gastroesophageal reflux disease)   . Headache   . Herpes genitalis in women   . Ureteral reflux 1975  . Vitamin D deficiency     Past Surgical History:  Procedure Laterality Date  . 24 HOUR PH STUDY N/A 09/18/2015   Procedure: 24 HOUR PH STUDY;  Surgeon: Anna Modena, MD;  Location: [facility];  Service: Endoscopy;  Laterality: N/A;  . ANTERIOR CERVICAL DECOMP/DISCECTOMY FUSION N/A 01/31/2014   Procedure: ANTERIOR CERVICAL DECOMPRESSION/DISCECTOMY FUSION CERVICAL FIVE-SIX,CERVICAL SIX-SEVEN WITH HARDWARE REMOVAL OF CODMAN PLATE AT CERVIAL FOUR-FIVE;  Surgeon: Anna Dollar, MD;  Location: [facility];  Service: Neurosurgery;  Laterality: N/A;  . BACK SURGERY Bilateral 2001   C4-C5 DISK "fusion"  . CERVICAL SPINE SURGERY    . DILATATION & CURETTAGE/HYSTEROSCOPY WITH TRUECLEAR     3'14-endometrial polyp-[facility]om[facility]  . DILATION AND CURETTAGE OF UTERUS    . ESOPHAGEAL MANOMETRY N/A 05/24/2013   Procedure: ESOPHAGEAL MANOMETRY (EM);  Surgeon: Anna Modena, MD;  Location: [facility];  Service: Endoscopy;  Laterality: N/A;  . ESOPHAGOGASTRODUODENOSCOPY    .  ESOPHAGOGASTRODUODENOSCOPY (EGD) WITH PROPOFOL N/A 05/24/2013   Procedure: ESOPHAGOGASTRODUODENOSCOPY (EGD) WITH PROPOFOL;  Surgeon: Anna Modena, MD;  Location: [facility];  Service: Endoscopy;  Laterality: N/A;  . EXTERNAL EAR SURGERY     bilateral "pinning" age 88  . MANDIBLE FRACTURE SURGERY  1990   retained hardware "wires"  . MANDIBLE SURGERY      Allergies  Allergen Reactions  . Codeine Nausea And Vomiting  . Hydrocodone Nausea And Vomiting  . Oxycodone Nausea And Vomiting  . Sumatriptan Other (See Comments)    Heart palpitations,SOB, chest pressure and shakiness    Review of systems negative except as noted in HPI / PMHx or noted below:  Review of Systems  Constitutional: Negative.   HENT: Negative.   Eyes: Negative.   Respiratory: Negative.   Cardiovascular: Negative.   Gastrointestinal: Negative.   Genitourinary: Negative.   Musculoskeletal: Negative.   Skin: Negative.   Neurological: Negative.   Endo/Heme/Allergies: Negative.   Psychiatric/Behavioral: Negative.      Objective:   Vitals:   12/12/15 1021  BP: 98/78  Pulse: 96  Resp: 18          Physical Exam  Constitutional: She is well-developed, well-nourished, and in no distress.  HENT:  Head: Normocephalic.  Right Ear: Tympanic membrane, external ear and ear canal normal.  Left Ear: Tympanic membrane, external ear and ear canal normal.  Nose: Nose normal. No mucosal edema or rhinorrhea.  Mouth/Throat: Uvula is midline, oropharynx is clear and moist and mucous membranes are normal. No oropharyngeal exudate.  Eyes: Conjunctivae are normal.  Neck: Trachea normal. No tracheal tenderness present. No tracheal deviation present. No thyromegaly present.  Cardiovascular: Normal rate, regular rhythm, S1 normal, S2 normal and normal heart sounds.   No murmur heard. Pulmonary/Chest: Breath sounds normal. No stridor. No respiratory distress. She has no wheezes. She has no rales.  Musculoskeletal: She  exhibits no edema.  Lymphadenopathy:       Head (right side): No tonsillar adenopathy present.       Head (left side): No tonsillar adenopathy present.    She has no cervical adenopathy.  Neurological: She is alert. Gait normal.  Skin: No rash noted. She is not diaphoretic. No erythema. Nails show no clubbing.  Psychiatric: Mood and affect normal.    Diagnostics:    Spirometry was performed and demonstrated an FEV1 of 2.05 at 82 % of predicted.   Assessment and Plan:   1. Asthma, moderate persistent, well-controlled   2. Allergic rhinoconjunctivitis   3. LPRD (laryngopharyngeal reflux disease)   4. Esophageal dysmotility     1. Continue Symbicort 160 - 2 inhalations twice a day  2. Continue Rhinocort one spray each nostril 3-7 times per week  3. Continue montelukast 10 mg tablet 1 time per day  4. Continue proton pump inhibitor in the morning plus ranitidine 300 mg in the evening as directed by Dr. Loreta Torres  5. Continue ProAir HFA and antihistamine if needed  6. Continue action plan for asthma flare  including adding high dose Qvar at 3 inhalations 3 times per day to Symbicort  7. No caffeine, no chocolate   8. Return to clinic in 6 months or earlier if problem  Overall Lucindia appears to be doing relatively well regarding her atopic respiratory disease and will keep her on anti-inflammatory medications for her respiratory tract as stated above. Her reflux and esophageal dysmotility is now being handled by Dr. Loreta Torres. I'll see her back in this clinic in 6 months or earlier if there is a problem.  Laurette Schimke, MD [facility]

## 2015-12-12 NOTE — Patient Instructions (Signed)
  1. Continue Symbicort 160 - 2 inhalations twice a day  2. Continue Rhinocort one spray each nostril 3-7 times per week  3. Continue montelukast 10 mg tablet 1 time per day  4. Continue proton pump inhibitor in the morning plus ranitidine 300 mg in the evening as directed by Dr. Loreta Ave  5. Continue ProAir HFA and antihistamine if needed  6. Continue action plan for asthma flare including adding high dose Qvar at 3 inhalations 3 times per day to Symbicort  7. No caffeine, no chocolate   8. Return to clinic in 6 months or earlier if problem

## 2015-12-20 ENCOUNTER — Other Ambulatory Visit (INDEPENDENT_AMBULATORY_CARE_PROVIDER_SITE_OTHER): Payer: BLUE CROSS/BLUE SHIELD

## 2015-12-20 DIAGNOSIS — Z01419 Encounter for gynecological examination (general) (routine) without abnormal findings: Secondary | ICD-10-CM | POA: Diagnosis not present

## 2015-12-20 DIAGNOSIS — R7989 Other specified abnormal findings of blood chemistry: Secondary | ICD-10-CM

## 2015-12-20 LAB — CBC WITH DIFFERENTIAL/PLATELET
BASOS PCT: 0.7 % (ref 0.0–3.0)
Basophils Absolute: 0.1 10*3/uL (ref 0.0–0.1)
EOS ABS: 0.5 10*3/uL (ref 0.0–0.7)
EOS PCT: 4.7 % (ref 0.0–5.0)
HCT: 40.3 % (ref 36.0–46.0)
HEMOGLOBIN: 13.5 g/dL (ref 12.0–15.0)
LYMPHS ABS: 2.9 10*3/uL (ref 0.7–4.0)
Lymphocytes Relative: 26.8 % (ref 12.0–46.0)
MCHC: 33.6 g/dL (ref 30.0–36.0)
MCV: 80 fl (ref 78.0–100.0)
Monocytes Absolute: 0.6 10*3/uL (ref 0.1–1.0)
Monocytes Relative: 5.4 % (ref 3.0–12.0)
NEUTROS ABS: 6.8 10*3/uL (ref 1.4–7.7)
NEUTROS PCT: 62.4 % (ref 43.0–77.0)
PLATELETS: 389 10*3/uL (ref 150.0–400.0)
RBC: 5.04 Mil/uL (ref 3.87–5.11)
RDW: 14.3 % (ref 11.5–15.5)
WBC: 10.9 10*3/uL — ABNORMAL HIGH (ref 4.0–10.5)

## 2015-12-20 LAB — COMPREHENSIVE METABOLIC PANEL
ALBUMIN: 4 g/dL (ref 3.5–5.2)
ALT: 22 U/L (ref 0–35)
AST: 20 U/L (ref 0–37)
Alkaline Phosphatase: 59 U/L (ref 39–117)
BUN: 9 mg/dL (ref 6–23)
CHLORIDE: 104 meq/L (ref 96–112)
CO2: 25 meq/L (ref 19–32)
Calcium: 9.4 mg/dL (ref 8.4–10.5)
Creatinine, Ser: 0.83 mg/dL (ref 0.40–1.20)
GFR: 79.23 mL/min (ref >60.00)
Glucose, Bld: 79 mg/dL (ref 70–99)
POTASSIUM: 4.1 meq/L (ref 3.5–5.1)
SODIUM: 138 meq/L (ref 135–145)
Total Bilirubin: 0.5 mg/dL (ref 0.2–1.2)
Total Protein: 7.7 g/dL (ref 6.0–8.3)

## 2015-12-20 LAB — LIPID PANEL
CHOL/HDL RATIO: 4
CHOLESTEROL: 214 mg/dL — AB (ref 0–200)
HDL: 57.8 mg/dL (ref >39.00)
Triglycerides: 515 mg/dL — ABNORMAL HIGH (ref 0.0–149.0)

## 2015-12-20 LAB — TSH: TSH: 2.23 u[IU]/mL (ref 0.35–4.50)

## 2015-12-20 LAB — LDL CHOLESTEROL, DIRECT: LDL DIRECT: 90 mg/dL

## 2015-12-25 ENCOUNTER — Other Ambulatory Visit (HOSPITAL_COMMUNITY)
Admission: RE | Admit: 2015-12-25 | Discharge: 2015-12-25 | Disposition: A | Discharge status: Home / Self Care | Payer: BLUE CROSS/BLUE SHIELD | Source: Ambulatory Visit | Attending: Family Medicine | Admitting: Family Medicine

## 2015-12-25 ENCOUNTER — Encounter: Payer: Self-pay | Admitting: Family Medicine

## 2015-12-25 ENCOUNTER — Ambulatory Visit (INDEPENDENT_AMBULATORY_CARE_PROVIDER_SITE_OTHER): Payer: BLUE CROSS/BLUE SHIELD | Admitting: Family Medicine

## 2015-12-25 VITALS — BP 104/60 | HR 91 | Temp 98.1°F | Ht 61.75 in | Wt 123.2 lb

## 2015-12-25 DIAGNOSIS — E781 Pure hyperglyceridemia: Secondary | ICD-10-CM | POA: Diagnosis not present

## 2015-12-25 DIAGNOSIS — Z01419 Encounter for gynecological examination (general) (routine) without abnormal findings: Secondary | ICD-10-CM

## 2015-12-25 DIAGNOSIS — N870 Mild cervical dysplasia: Secondary | ICD-10-CM | POA: Diagnosis not present

## 2015-12-25 LAB — LIPID PANEL
CHOLESTEROL: 222 mg/dL — AB (ref 0–200)
HDL: 75.8 mg/dL (ref >39.00)
NonHDL: 145.96
TRIGLYCERIDES: 247 mg/dL — AB (ref 0.0–149.0)
Total CHOL/HDL Ratio: 3
VLDL: 49.4 mg/dL — AB (ref 0.0–40.0)

## 2015-12-25 LAB — LDL CHOLESTEROL, DIRECT: Direct LDL: 110 mg/dL

## 2015-12-25 MED ORDER — NORGESTIMATE-ETH ESTRADIOL 0.25-35 MG-MCG PO TABS: ORAL_TABLET | ORAL | 3 refills | Status: DC

## 2015-12-25 NOTE — Progress Notes (Signed)
Pre visit review using our clinic review tool, if applicable. No additional management support is needed unless otherwise documented below in the visit note. 

## 2015-12-25 NOTE — Assessment & Plan Note (Signed)
Pap smear done today

## 2015-12-25 NOTE — Patient Instructions (Signed)
Great to see you. We will call you with your results from today and you can see them online. 

## 2015-12-25 NOTE — Assessment & Plan Note (Addendum)
With normal fasting blood sugar- ? Error. She does not drink alcohol. Will recheck lipid panel today.

## 2015-12-25 NOTE — Progress Notes (Signed)
Subjective:   Patient ID: Anna Torres, female    DOB: [birth year], 45 y.o.   MRN: [id number]  Anna Torres is a pleasant 45 y.o. year old female who presents to clinic today with Annual Exam (with pap)  on 12/25/2015  HPI:  Had GYN- Dr. Lily Peer but I took over the gynecological care last year. Last pap smear 12/21/14-done by me.   Does have a h/o CIN I- has been over 5 years. Mammogram 12/115/16  Weight loss-  Has lost 10 pounds with change of diet.  Does not plan on losing more.  Wt Readings from Last 3 Encounters:  12/25/15 123 lb 4 oz (55.9 kg)  11/08/15 125 lb (56.7 kg)  07/11/15 132 lb 3.2 oz (60 kg)    . Lab Results  Component Value Date   ALT 22 12/20/2015   AST 20 12/20/2015   ALKPHOS 59 12/20/2015   BILITOT 0.5 12/20/2015   Lab Results  Component Value Date   WBC 10.9 (H) 12/20/2015   HGB 13.5 12/20/2015   HCT 40.3 12/20/2015   MCV 80.0 12/20/2015   PLT 389.0 12/20/2015   Lab Results  Component Value Date   TSH 2.23 12/20/2015    Lab Results  Component Value Date   CHOL 214 (H) 12/20/2015   HDL 57.80 12/20/2015   LDLCALC 109 (H) 12/21/2014   LDLDIRECT 90.0 12/20/2015   TRIG (H) 12/20/2015    515.0 Triglyceride is over 400; calculations on Lipids are invalid.   CHOLHDL 4 12/20/2015   Current Outpatient Prescriptions on File Prior to Visit  Medication Sig Dispense Refill  . acyclovir (ZOVIRAX) 400 MG tablet TAKE ONE (1) TABLET BY MOUTH EVERY OTHERDAY 30 tablet 5  . budesonide (RHINOCORT ALLERGY) 32 MCG/ACT nasal spray Place 1 spray into both nostrils daily.    . budesonide-formoterol (SYMBICORT) 160-4.5 MCG/ACT inhaler INHALE TWO PUFFS TWICE DAILY TO PREVENT COUGH OR WHEEZE 1 Inhaler 5  . glucosamine-chondroitin 500-400 MG tablet Take 1 tablet by mouth daily.    . montelukast (SINGULAIR) 10 MG tablet Take 1 tablet (10 mg total) by mouth at bedtime. 30 tablet 5  . omeprazole (PRILOSEC) 40 MG capsule Take 40 mg by mouth every  morning.    . ranitidine (ZANTAC) 300 MG tablet Take 1 tablet (300 mg total) by mouth at bedtime. 30 tablet 5   No current facility-administered medications on file prior to visit.     Allergies  Allergen Reactions  . Codeine Nausea And Vomiting  . Hydrocodone Nausea And Vomiting  . Oxycodone Nausea And Vomiting  . Sumatriptan Other (See Comments)    Heart palpitations,SOB, chest pressure and shakiness    Past Medical History:  Diagnosis Date  . Asthma   . Asthma    last episode january 2015  . CIN I (cervical intraepithelial neoplasia I)   . Environmental and seasonal allergies   . GERD (gastroesophageal reflux disease)   . Headache   . Herpes genitalis in women   . Ureteral reflux 1975  . Vitamin D deficiency     Past Surgical History:  Procedure Laterality Date  . 24 HOUR PH STUDY N/A 09/18/2015   Procedure: 24 HOUR PH STUDY;  Surgeon: Willis Modena, MD;  Location: [facility];  Service: Endoscopy;  Laterality: N/A;  . ANTERIOR CERVICAL DECOMP/DISCECTOMY FUSION N/A 01/31/2014   Procedure: ANTERIOR CERVICAL DECOMPRESSION/DISCECTOMY FUSION CERVICAL FIVE-SIX,CERVICAL SIX-SEVEN WITH HARDWARE REMOVAL OF CODMAN PLATE AT CERVIAL FOUR-FIVE;  Surgeon: Mariam Dollar, MD;  Location[facility]  OR;  Service: Neurosurgery;  Laterality: N/A;  . BACK SURGERY Bilateral 2001   C4-C5 DISK "fusion"  . CERVICAL SPINE SURGERY    . DILATATION & CURETTAGE/HYSTEROSCOPY WITH TRUECLEAR     3'14-endometrial polyp-Womens  . DILATION AND CURETTAGE OF UTERUS    . ESOPHAGEAL MANOMETRY N/A 05/24/2013   Procedure: ESOPHAGEAL MANOMETRY (EM);  Surgeon: Willis ModenaWilliam Outlaw, MD;  Location: [facility];  Service: Endoscopy;  Laterality: N/A;  . ESOPHAGOGASTRODUODENOSCOPY    . ESOPHAGOGASTRODUODENOSCOPY (EGD) WITH PROPOFOL N/A 05/24/2013   Procedure: ESOPHAGOGASTRODUODENOSCOPY (EGD) WITH PROPOFOL;  Surgeon: Willis ModenaWilliam Outlaw, MD;  Location: [facility];  Service: Endoscopy;  Laterality: N/A;  . EXTERNAL EAR SURGERY      bilateral "pinning" age 89  . MANDIBLE FRACTURE SURGERY  1990   retained hardware "wires"  . MANDIBLE SURGERY      Family History  Problem Relation Age of Onset  . Diabetes Mother   . Rheum arthritis Mother   . Arthritis Mother   . Cancer Mother   . Graves' disease Sister     Social History   Social History  . Marital status: Married    Spouse name: N/A  . Number of children: N/A  . Years of education: N/A   Occupational History  . Not on file.   Social History Main Topics  . Smoking status: Never Smoker  . Smokeless tobacco: Never Used  . Alcohol use 0.0 oz/week     Comment: rare social  . Drug use: No  . Sexual activity: Yes    Birth control/ protection: Pill   Other Topics Concern  . Not on file   Social History Narrative   ** Merged History Encounter **       The PMH, PSH, Social History, Family History, Medications, and allergies have been reviewed in [facility], and have been updated if relevant.    Review of Systems  Constitutional: Negative.   HENT: Negative.   Eyes: Negative.   Respiratory: Negative.   Cardiovascular: Negative.   Gastrointestinal: Negative.   Endocrine: Negative.   Genitourinary: Negative.   Musculoskeletal: Negative.   Skin: Negative.   Allergic/Immunologic: Negative.   Neurological: Negative.   Hematological: Negative.   Psychiatric/Behavioral: Negative.   All other systems reviewed and are negative.      Objective:    BP 104/60   Pulse 91   Temp 98.1 F (36.7 C) (Oral)   Ht 5' 1.75" (1.568 m)   Wt 123 lb 4 oz (55.9 kg)   SpO2 96%   BMI 22.73 kg/m   Wt Readings from Last 3 Encounters:  12/25/15 123 lb 4 oz (55.9 kg)  11/08/15 125 lb (56.7 kg)  07/11/15 132 lb 3.2 oz (60 kg)    Physical Exam   General:  Well-developed,well-nourished,in no acute distress; alert,appropriate and cooperative throughout examination Head:  normocephalic and atraumatic.   Eyes:  vision grossly intact, PERRL Ears:  R ear normal and L  ear normal externally, TMs clear bilaterally Nose:  no external deformity.   Mouth:  good dentition.   Neck:  No deformities, masses, or tenderness noted. Breasts:  No mass, nodules, thickening, tenderness, bulging, retraction, inflamation, nipple discharge or skin changes noted.   Lungs:  Normal respiratory effort, chest expands symmetrically. Lungs are clear to auscultation, no crackles or wheezes. Heart:  Normal rate and regular rhythm. S1 and S2 normal without gallop, murmur, click, rub or other extra sounds. Abdomen:  Bowel sounds positive,abdomen soft and non-tender without masses, organomegaly or hernias noted.  Rectal:  no external abnormalities.   Genitalia:  Pelvic Exam:        External: normal female genitalia without lesions or masses        Vagina: normal without lesions or masses        Cervix: normal without lesions or masses        Adnexa: normal bimanual exam without masses or fullness        Uterus: normal by palpation        Pap smear: performed Msk:  No deformity or scoliosis noted of thoracic or lumbar spine.   Extremities:  No clubbing, cyanosis, edema, or deformity noted with normal full range of motion of all joints.   Neurologic:  alert & oriented X3 and gait normal.   Skin:  Intact without suspicious lesions or rashes Cervical Nodes:  No lymphadenopathy noted Axillary Nodes:  No palpable lymphadenopathy Psych:  Cognition and judgment appear intact. Alert and cooperative with normal attention span and concentration. No apparent delusions, illusions, hallucinations      Assessment & Plan:   Well woman exam  CIN I (cervical intraepithelial neoplasia I)  Hypertriglyceridemia No Follow-up on file.

## 2015-12-25 NOTE — Assessment & Plan Note (Signed)
Reviewed preventive care protocols, scheduled due services, and updated immunizations Discussed nutrition, exercise, diet, and healthy lifestyle.  

## 2015-12-25 NOTE — Addendum Note (Signed)
Addended by: Desmond Dike on: 12/25/2015 09:57 AM   Modules accepted: Orders

## 2015-12-26 LAB — CYTOLOGY - PAP: Diagnosis: NEGATIVE

## 2015-12-28 ENCOUNTER — Other Ambulatory Visit: Payer: Self-pay

## 2015-12-28 MED ORDER — MONTELUKAST SODIUM 10 MG PO TABS: 10.0000 mg | ORAL_TABLET | Freq: Every day | ORAL | 5 refills | Status: DC

## 2016-01-18 ENCOUNTER — Other Ambulatory Visit: Payer: Self-pay

## 2016-01-18 MED ORDER — BUDESONIDE-FORMOTEROL FUMARATE 160-4.5 MCG/ACT IN AERO: INHALATION_SPRAY | RESPIRATORY_TRACT | 3 refills | Status: DC

## 2016-02-09 ENCOUNTER — Other Ambulatory Visit: Payer: Self-pay

## 2016-02-09 MED ORDER — RANITIDINE HCL 300 MG PO TABS: 300.0000 mg | ORAL_TABLET | Freq: Every day | ORAL | 5 refills | Status: DC

## 2016-03-04 ENCOUNTER — Encounter: Payer: Self-pay | Admitting: Family Medicine

## 2016-03-05 ENCOUNTER — Other Ambulatory Visit: Payer: Self-pay | Admitting: Family Medicine

## 2016-03-05 DIAGNOSIS — R499 Unspecified voice and resonance disorder: Secondary | ICD-10-CM

## 2016-03-22 DIAGNOSIS — J383 Other diseases of vocal cords: Secondary | ICD-10-CM | POA: Insufficient documentation

## 2016-04-21 DIAGNOSIS — F444 Conversion disorder with motor symptom or deficit: Secondary | ICD-10-CM | POA: Insufficient documentation

## 2016-04-23 ENCOUNTER — Other Ambulatory Visit: Payer: Self-pay | Admitting: Family Medicine

## 2016-05-20 ENCOUNTER — Other Ambulatory Visit: Payer: Self-pay | Admitting: Family Medicine

## 2016-05-22 ENCOUNTER — Encounter: Payer: Self-pay | Admitting: Family Medicine

## 2016-05-28 ENCOUNTER — Telehealth: Payer: Self-pay | Admitting: Allergy and Immunology

## 2016-05-28 MED ORDER — BUDESONIDE-FORMOTEROL FUMARATE 160-4.5 MCG/ACT IN AERO: INHALATION_SPRAY | RESPIRATORY_TRACT | 0 refills | Status: DC

## 2016-05-28 NOTE — Telephone Encounter (Signed)
Called patient to inform her that I sent in a refill for Symbicort to [facility] on [address] in [place].

## 2016-05-28 NOTE — Telephone Encounter (Signed)
Pt called and has appointment on 06/11/2016 but she will run out of her inhaler next week and needs one. [facility] [address] [place] . [phone]

## 2016-05-29 ENCOUNTER — Ambulatory Visit (INDEPENDENT_AMBULATORY_CARE_PROVIDER_SITE_OTHER): Payer: 59 | Admitting: Family Medicine

## 2016-05-29 ENCOUNTER — Encounter: Payer: Self-pay | Admitting: Family Medicine

## 2016-05-29 DIAGNOSIS — Z309 Encounter for contraceptive management, unspecified: Secondary | ICD-10-CM | POA: Insufficient documentation

## 2016-05-29 DIAGNOSIS — F4325 Adjustment disorder with mixed disturbance of emotions and conduct: Secondary | ICD-10-CM | POA: Diagnosis not present

## 2016-05-29 DIAGNOSIS — F4320 Adjustment disorder, unspecified: Secondary | ICD-10-CM | POA: Insufficient documentation

## 2016-05-29 NOTE — Progress Notes (Signed)
Pre visit review using our clinic review tool, if applicable. No additional management support is needed unless otherwise documented below in the visit note. 

## 2016-05-29 NOTE — Patient Instructions (Signed)
Great to see you.  Hang in there!  Please come see me in 3 months.

## 2016-05-29 NOTE — Assessment & Plan Note (Signed)
>  15 minutes spent in face to face time with patient, >50% spent in counselling or coordination of care discussing mood swings and contraception management.  We agreed that it would be difficult to know if OCPs are causing mood swings at this time since she is also going through an emotional and difficult time in her life.  She does not want to be referred for psychotherapy or trial of antidepressants.  We agreed not to change OCPs at this time. She will follow up with me in 3 months.

## 2016-05-29 NOTE — Progress Notes (Signed)
Subjective:   Patient ID: Anna Torres, female    DOB: 02-21-1970, 46 y.o.   MRN: [id number]  Anna Torres is a pleasant 46 y.o. year old female who presents to clinic today with Discuss Possible Birth Control Options  on 05/29/2016  HPI:  She is going through a separation from her husband and is wondering if she should try a new OCP.   Currently on sprintec and this is working well for her. Periods are infrequent and light.  Before starting OCPs, she had mood swings and wonders if that is why she is so tearful and upset.  Denies SI or HI.  Has good support system with her family and friends.  Separation was her decision but still difficult.  Current Outpatient Prescriptions on File Prior to Visit  Medication Sig Dispense Refill  . acyclovir (ZOVIRAX) 400 MG tablet TAKE ONE TABLET BY MOUTH EVERY OTHER DAY 30 tablet 4  . budesonide (RHINOCORT ALLERGY) 32 MCG/ACT nasal spray Place 1 spray into both nostrils daily.    . budesonide-formoterol (SYMBICORT) 160-4.5 MCG/ACT inhaler INHALE 2 PUFFS EVERY 12 HOURS TO PREVENT COUGH OR WHEEZING..RINSE, GARGLE, AND SPIT AFTER EACH USE 10.2 g 0  . glucosamine-chondroitin 500-400 MG tablet Take 1 tablet by mouth daily.    . montelukast (SINGULAIR) 10 MG tablet Take 1 tablet (10 mg total) by mouth at bedtime. 30 tablet 5  . norgestimate-ethinyl estradiol (SPRINTEC 28) 0.25-35 MG-MCG tablet TAKE 1 TABLET BY MOUTH DAILY 3 Package 3  . ranitidine (ZANTAC) 300 MG tablet Take 1 tablet (300 mg total) by mouth at bedtime. 30 tablet 5   No current facility-administered medications on file prior to visit.     Allergies  Allergen Reactions  . Codeine Nausea And Vomiting  . Hydrocodone Nausea And Vomiting  . Oxycodone Nausea And Vomiting  . Sumatriptan Other (See Comments)    Heart palpitations,SOB, chest pressure and shakiness    Past Medical History:  Diagnosis Date  . Asthma   . Asthma    last episode january 2015  . CIN I  (cervical intraepithelial neoplasia I)   . Environmental and seasonal allergies   . GERD (gastroesophageal reflux disease)   . Headache   . Herpes genitalis in women   . Ureteral reflux 1975  . Vitamin D deficiency     Past Surgical History:  Procedure Laterality Date  . 24 HOUR PH STUDY N/A 09/18/2015   Procedure: 24 HOUR PH STUDY;  Surgeon: Willis Modena, MD;  Location: [facility];  Service: Endoscopy;  Laterality: N/A;  . ANTERIOR CERVICAL DECOMP/DISCECTOMY FUSION N/A 01/31/2014   Procedure: ANTERIOR CERVICAL DECOMPRESSION/DISCECTOMY FUSION CERVICAL FIVE-SIX,CERVICAL SIX-SEVEN WITH HARDWARE REMOVAL OF CODMAN PLATE AT CERVIAL FOUR-FIVE;  Surgeon: Mariam Dollar, MD;  Location: [facility] OR;  Service: Neurosurgery;  Laterality: N/A;  . BACK SURGERY Bilateral 2001   C4-C5 DISK "fusion"  . CERVICAL SPINE SURGERY    . DILATATION & CURETTAGE/HYSTEROSCOPY WITH TRUECLEAR     3'14-endometrial polyp-Womens  . DILATION AND CURETTAGE OF UTERUS    . ESOPHAGEAL MANOMETRY N/A 05/24/2013   Procedure: ESOPHAGEAL MANOMETRY (EM);  Surgeon: Willis Modena, MD;  Location: [facility];  Service: Endoscopy;  Laterality: N/A;  . ESOPHAGOGASTRODUODENOSCOPY    . ESOPHAGOGASTRODUODENOSCOPY (EGD) WITH PROPOFOL N/A 05/24/2013   Procedure: ESOPHAGOGASTRODUODENOSCOPY (EGD) WITH PROPOFOL;  Surgeon: Willis Modena, MD;  Location: [facility];  Service: Endoscopy;  Laterality: N/A;  . EXTERNAL EAR SURGERY     bilateral "pinning" age 50  . MANDIBLE  FRACTURE SURGERY  1990   retained hardware "wires"  . MANDIBLE SURGERY      Family History  Problem Relation Age of Onset  . Diabetes Mother   . Rheum arthritis Mother   . Arthritis Mother   . Cancer Mother   . Graves' disease Sister     Social History   Social History  . Marital status: Married    Spouse name: N/A  . Number of children: N/A  . Years of education: N/A   Occupational History  . Not on file.   Social History Main Topics  . Smoking status: Never  Smoker  . Smokeless tobacco: Never Used  . Alcohol use 0.0 oz/week     Comment: rare social  . Drug use: No  . Sexual activity: Yes    Birth control/ protection: Pill   Other Topics Concern  . Not on file   Social History Narrative   ** Merged History Encounter **       The PMH, PSH, Social History, Family History, Medications, and allergies have been reviewed in [facility], and have been updated if relevant.   Review of Systems  Psychiatric/Behavioral: Positive for dysphoric mood. Negative for behavioral problems, confusion, decreased concentration, hallucinations, self-injury, sleep disturbance and suicidal ideas. The patient is nervous/anxious. The patient is not hyperactive.        Objective:    BP 104/78 (BP Location: Left Arm, Patient Position: Sitting, Cuff Size: Normal)   Pulse 88   Temp 98.3 F (36.8 C) (Oral)   Wt 123 lb (55.8 kg)   LMP 05/22/2016 (Exact Date)   SpO2 98%   BMI 22.68 kg/m    Physical Exam  Constitutional: She is oriented to person, place, and time. She appears well-developed and well-nourished. No distress.  HENT:  Head: Normocephalic.  Eyes: Conjunctivae are normal.  Cardiovascular: Normal rate.   Pulmonary/Chest: Effort normal.  Musculoskeletal: Normal range of motion.  Neurological: She is alert and oriented to person, place, and time. No cranial nerve deficit.  Skin: Skin is warm and dry. She is not diaphoretic.  Psychiatric: She has a normal mood and affect. Her behavior is normal. Judgment and thought content normal.  Nursing note and vitals reviewed.         Assessment & Plan:   Encounter for contraceptive management, unspecified type  Adjustment disorder with mixed disturbance of emotions and conduct No Follow-up on file.

## 2016-06-05 ENCOUNTER — Encounter: Payer: Self-pay | Admitting: Gynecology

## 2016-06-11 ENCOUNTER — Encounter: Payer: Self-pay | Admitting: Allergy and Immunology

## 2016-06-11 ENCOUNTER — Ambulatory Visit (INDEPENDENT_AMBULATORY_CARE_PROVIDER_SITE_OTHER): Payer: 59 | Admitting: Allergy and Immunology

## 2016-06-11 VITALS — BP 108/70 | HR 80 | Resp 20

## 2016-06-11 DIAGNOSIS — J4540 Moderate persistent asthma, uncomplicated: Secondary | ICD-10-CM | POA: Diagnosis not present

## 2016-06-11 DIAGNOSIS — J309 Allergic rhinitis, unspecified: Secondary | ICD-10-CM | POA: Diagnosis not present

## 2016-06-11 DIAGNOSIS — K219 Gastro-esophageal reflux disease without esophagitis: Secondary | ICD-10-CM | POA: Diagnosis not present

## 2016-06-11 DIAGNOSIS — H1010 Acute atopic conjunctivitis, unspecified eye: Secondary | ICD-10-CM

## 2016-06-11 NOTE — Patient Instructions (Addendum)
  1. Continue Symbicort 160 - 2 inhalations twice a day  2. Continue Rhinocort one spray each nostril 3-7 times per week  3. Continue montelukast 10 mg tablet 1 time per day  4. Continue Dexilant 60mg  in the morning plus ranitidine 150 mg two tablets in the evening if needed  5. Continue ProAir HFA and antihistamine if needed  6. Continue action plan for asthma flare including adding high dose Qvar 80 Redihaler at 3 inhalations 3 times per day to Symbicort  7. No caffeine, no chocolate   8. Return to clinic in 6 months or earlier if problem

## 2016-06-11 NOTE — Progress Notes (Signed)
Follow-up Note  Referring Provider: Dianne Dun, MD Primary Provider: Dianne Dun, MD Date of Office Visit: 06/11/2016  Subjective:   Anna Torres (DOB: 1970-08-04) is a 46 y.o. female who [facility]2016 in re-evaluation of the following:  HPI: Anna Torres returns to this clinic in evaluation of her asthma and allergic rhinitis and LPR. Her last visit to this clinic was November 2017.  She has done very well. She has not required a systemic steroid or antibiotic to treat any type of respiratory tract issue. She rarely uses a short acting bronchodilator and can exercise without any problem. She has very little issues with nasal congestion or sneezing.  Her reflux is under good control. She is currently using her Dexilant and ranitidine. She rarely has any reflux events at this point in time. She did visit with the vocal cord disorder Center at [facility] and apparently was told that her throat structures were abnormal but there was nothing worrisome.  Allergies as of 06/11/2016      Reactions   Codeine Nausea And Vomiting   Hydrocodone Nausea And Vomiting   Oxycodone Nausea And Vomiting   Sumatriptan Other (See Comments)   Heart palpitations,SOB, chest pressure and shakiness      Medication List      acyclovir 400 MG tablet Commonly known as:  ZOVIRAX TAKE ONE TABLET BY MOUTH EVERY OTHER DAY   budesonide-formoterol 160-4.5 MCG/ACT inhaler Commonly known as:  SYMBICORT INHALE 2 PUFFS EVERY 12 HOURS TO PREVENT COUGH OR WHEEZING..RINSE, GARGLE, AND SPIT AFTER EACH USE   dexlansoprazole 60 MG capsule Commonly known as:  DEXILANT Take 60 mg by mouth daily.   glucosamine-chondroitin 500-400 MG tablet Take 1 tablet by mouth daily.   montelukast 10 MG tablet Commonly known as:  SINGULAIR Take 1 tablet (10 mg total) by mouth at bedtime.   norgestimate-ethinyl estradiol 0.25-35 MG-MCG tablet Commonly known as:   SPRINTEC 28 TAKE 1 TABLET BY MOUTH DAILY   ranitidine 300 MG tablet Commonly known as:  ZANTAC Take 1 tablet (300 mg total) by mouth at bedtime.   RHINOCORT ALLERGY 32 MCG/ACT nasal spray Generic drug:  budesonide Place 1 spray into both nostrils daily.       Past Medical History:  Diagnosis Date  . Asthma   . Asthma    last episode january 2015  . CIN I (cervical intraepithelial neoplasia I)   . Environmental and seasonal allergies   . GERD (gastroesophageal reflux disease)   . Headache   . Herpes genitalis in women   . Ureteral reflux 1975  . Vitamin D deficiency     Past Surgical History:  Procedure Laterality Date  . 24 HOUR PH STUDY N/A 09/18/2015   Procedure: 24 HOUR PH STUDY;  Surgeon: Willis Modena, MD;  Location: [facility];  Service: Endoscopy;  Laterality: N/A;  . ANTERIOR CERVICAL DECOMP/DISCECTOMY FUSION N/A 01/31/2014   Procedure: ANTERIOR CERVICAL DECOMPRESSION/DISCECTOMY FUSION CERVICAL FIVE-SIX,CERVICAL SIX-SEVEN WITH HARDWARE REMOVAL OF CODMAN PLATE AT CERVIAL FOUR-FIVE;  Surgeon: Mariam Dollar, MD;  Location: [facility] OR;  Service: Neurosurgery;  Laterality: N/A;  . BACK SURGERY Bilateral 2001   C4-C5 DISK "fusion"  . CERVICAL SPINE SURGERY    . DILATATION & CURETTAGE/HYSTEROSCOPY WITH TRUECLEAR     3'14-endometrial polyp-Womens  . DILATION AND CURETTAGE OF UTERUS    . ESOPHAGEAL MANOMETRY N/A 05/24/2013   Procedure: ESOPHAGEAL MANOMETRY (EM);  Surgeon: Willis Modena, MD;  Location: [facility];  Service: Endoscopy;  Laterality: N/A;  . ESOPHAGOGASTRODUODENOSCOPY    . ESOPHAGOGASTRODUODENOSCOPY (EGD) WITH PROPOFOL N/A 05/24/2013   Procedure: ESOPHAGOGASTRODUODENOSCOPY (EGD) WITH PROPOFOL;  Surgeon: Willis Modena, MD;  Location: [facility];  Service: Endoscopy;  Laterality: N/A;  . EXTERNAL EAR SURGERY     bilateral "pinning" age 32  . MANDIBLE FRACTURE SURGERY  1990   retained hardware "wires"  . MANDIBLE SURGERY      Review of systems negative except  as noted in HPI / PMHx or noted below:  Review of Systems  Constitutional: Negative.   HENT: Negative.   Eyes: Negative.   Respiratory: Negative.   Cardiovascular: Negative.   Gastrointestinal: Negative.   Genitourinary: Negative.   Musculoskeletal: Negative.   Skin: Negative.   Neurological: Negative.   Endo/Heme/Allergies: Negative.   Psychiatric/Behavioral: Negative.      Objective:   Vitals:   06/11/16 1014  BP: 108/70  Pulse: 80  Resp: 20          Physical Exam  Constitutional: She is well-developed, well-nourished, and in no distress.  HENT:  Head: Normocephalic.  Right Ear: Tympanic membrane, external ear and ear canal normal.  Left Ear: Tympanic membrane, external ear and ear canal normal.  Nose: Nose normal. No mucosal edema or rhinorrhea.  Mouth/Throat: Uvula is midline, oropharynx is clear and moist and mucous membranes are normal. No oropharyngeal exudate.  Eyes: Conjunctivae are normal.  Neck: Trachea normal. No tracheal tenderness present. No tracheal deviation present. No thyromegaly present.  Cardiovascular: Normal rate, regular rhythm, S1 normal, S2 normal and normal heart sounds.   No murmur heard. Pulmonary/Chest: Breath sounds normal. No stridor. No respiratory distress. She has no wheezes. She has no rales.  Musculoskeletal: She exhibits no edema.  Lymphadenopathy:       Head (right side): No tonsillar adenopathy present.       Head (left side): No tonsillar adenopathy present.    She has no cervical adenopathy.  Neurological: She is alert. Gait normal.  Skin: No rash noted. She is not diaphoretic. No erythema. Nails show no clubbing.  Psychiatric: Mood and affect normal.    Diagnostics:    Spirometry was performed and demonstrated an FEV1 of 1.90 at 74 % of predicted.  The patient had an Asthma Control Test with the following results: ACT Total Score: 24.    Assessment and Plan:   1. Asthma, moderate persistent, well-controlled   2.  Allergic rhinoconjunctivitis   3. LPRD (laryngopharyngeal reflux disease)     1. Continue Symbicort 160 - 2 inhalations twice a day  2. Continue Rhinocort one spray each nostril 3-7 times per week  3. Continue montelukast 10 mg tablet 1 time per day  4. Continue Dexilant 60mg  in the morning plus ranitidine 150 mg two tablets in the evening if needed  5. Continue ProAir HFA and antihistamine if needed  6. Continue action plan for asthma flare including adding high dose Qvar 80 Redihaler at 3 inhalations 3 times per day to Symbicort  7. No caffeine, no chocolate   8. Return to clinic in 6 months or earlier if problem  appears to be doing very well on her current medical therapy and she will continue to use anti-inflammatory agents for her respiratory tract and therapy directed against reflux. I will see her back in this clinic in 6 months or earlier should there be a problem.  Anna Hansen, MD Allergy / Immunology [facility]

## 2016-06-26 ENCOUNTER — Other Ambulatory Visit: Payer: Self-pay | Admitting: Allergy and Immunology

## 2016-08-15 ENCOUNTER — Encounter: Payer: Self-pay | Admitting: Family Medicine

## 2016-08-29 ENCOUNTER — Ambulatory Visit: Payer: 59 | Admitting: Family Medicine

## 2016-10-09 ENCOUNTER — Other Ambulatory Visit: Payer: Self-pay | Admitting: Family Medicine

## 2016-10-11 NOTE — Telephone Encounter (Signed)
Pt left v/m requesting new BC rx sent to [facility] [place]; when this rx was approved on 10/09/16 was the printed rx sent somewhere.Please advise.

## 2016-10-12 ENCOUNTER — Encounter: Payer: Self-pay | Admitting: Family Medicine

## 2016-10-14 MED ORDER — NORGESTIMATE-ETH ESTRADIOL 0.25-35 MG-MCG PO TABS: ORAL_TABLET | ORAL | 3 refills | Status: DC

## 2016-10-14 NOTE — Addendum Note (Signed)
Addended by: Gregery Na on: 10/14/2016 04:54 PM   Modules accepted: Orders

## 2016-10-14 NOTE — Telephone Encounter (Signed)
E-scribed as requested per patient.

## 2016-10-16 ENCOUNTER — Other Ambulatory Visit: Payer: Self-pay

## 2016-10-16 MED ORDER — NORGESTIMATE-ETH ESTRADIOL 0.25-35 MG-MCG PO TABS: 1.0000 | ORAL_TABLET | Freq: Every day | ORAL | 3 refills | Status: DC

## 2017-01-06 ENCOUNTER — Other Ambulatory Visit: Payer: Self-pay

## 2017-01-06 MED ORDER — MONTELUKAST SODIUM 10 MG PO TABS: 10.0000 mg | ORAL_TABLET | Freq: Every day | ORAL | 0 refills | Status: DC

## 2017-01-06 NOTE — Telephone Encounter (Signed)
30 day courtesy refill given. She needs to make a follow up appt.

## 2017-01-29 ENCOUNTER — Other Ambulatory Visit: Payer: Self-pay | Admitting: Family Medicine

## 2017-01-29 ENCOUNTER — Encounter: Payer: Self-pay | Admitting: Allergy & Immunology

## 2017-01-29 ENCOUNTER — Ambulatory Visit: Payer: BLUE CROSS/BLUE SHIELD | Admitting: Allergy & Immunology

## 2017-01-29 ENCOUNTER — Other Ambulatory Visit (INDEPENDENT_AMBULATORY_CARE_PROVIDER_SITE_OTHER): Payer: BLUE CROSS/BLUE SHIELD

## 2017-01-29 ENCOUNTER — Other Ambulatory Visit: Payer: Self-pay

## 2017-01-29 VITALS — BP 116/76 | HR 96 | Resp 18

## 2017-01-29 DIAGNOSIS — J302 Other seasonal allergic rhinitis: Secondary | ICD-10-CM

## 2017-01-29 DIAGNOSIS — J4540 Moderate persistent asthma, uncomplicated: Secondary | ICD-10-CM

## 2017-01-29 DIAGNOSIS — K219 Gastro-esophageal reflux disease without esophagitis: Secondary | ICD-10-CM | POA: Diagnosis not present

## 2017-01-29 DIAGNOSIS — Z01419 Encounter for gynecological examination (general) (routine) without abnormal findings: Secondary | ICD-10-CM | POA: Diagnosis not present

## 2017-01-29 DIAGNOSIS — J3089 Other allergic rhinitis: Secondary | ICD-10-CM | POA: Diagnosis not present

## 2017-01-29 LAB — CBC WITH DIFFERENTIAL/PLATELET
BASOS ABS: 0.1 10*3/uL (ref 0.0–0.1)
Basophils Relative: 0.8 % (ref 0.0–3.0)
Eosinophils Absolute: 0.3 10*3/uL (ref 0.0–0.7)
Eosinophils Relative: 3.6 % (ref 0.0–5.0)
HEMATOCRIT: 40.7 % (ref 36.0–46.0)
Hemoglobin: 13.5 g/dL (ref 12.0–15.0)
LYMPHS ABS: 2.4 10*3/uL (ref 0.7–4.0)
LYMPHS PCT: 26.4 % (ref 12.0–46.0)
MCHC: 33.3 g/dL (ref 30.0–36.0)
MCV: 81.9 fl (ref 78.0–100.0)
MONOS PCT: 5.9 % (ref 3.0–12.0)
Monocytes Absolute: 0.5 10*3/uL (ref 0.1–1.0)
NEUTROS PCT: 63.3 % (ref 43.0–77.0)
Neutro Abs: 5.7 10*3/uL (ref 1.4–7.7)
Platelets: 406 10*3/uL — ABNORMAL HIGH (ref 150.0–400.0)
RBC: 4.97 Mil/uL (ref 3.87–5.11)
RDW: 13.8 % (ref 11.5–15.5)
WBC: 9 10*3/uL (ref 4.0–10.5)

## 2017-01-29 LAB — LIPID PANEL
CHOLESTEROL: 208 mg/dL — AB (ref 0–200)
HDL: 60.3 mg/dL (ref >39.00)
NonHDL: 147.3
Total CHOL/HDL Ratio: 3
VLDL: 80 mg/dL — ABNORMAL HIGH (ref 0.0–40.0)

## 2017-01-29 LAB — COMPREHENSIVE METABOLIC PANEL
ALBUMIN: 4 g/dL (ref 3.5–5.2)
ALT: 20 U/L (ref 0–35)
AST: 28 U/L (ref 0–37)
Alkaline Phosphatase: 64 U/L (ref 39–117)
BILIRUBIN TOTAL: 0.7 mg/dL (ref 0.2–1.2)
BUN: 11 mg/dL (ref 6–23)
CALCIUM: 9.3 mg/dL (ref 8.4–10.5)
CO2: 20 mEq/L (ref 19–32)
Chloride: 105 mEq/L (ref 96–112)
Creatinine, Ser: 0.76 mg/dL (ref 0.40–1.20)
GFR: 87.27 mL/min (ref >60.00)
Glucose, Bld: 83 mg/dL (ref 70–99)
Potassium: 4.3 mEq/L (ref 3.5–5.1)
Sodium: 138 mEq/L (ref 135–145)
TOTAL PROTEIN: 7.3 g/dL (ref 6.0–8.3)

## 2017-01-29 LAB — LDL CHOLESTEROL, DIRECT: Direct LDL: 105 mg/dL

## 2017-01-29 LAB — TSH: TSH: 2.05 u[IU]/mL (ref 0.35–4.50)

## 2017-01-29 NOTE — Progress Notes (Signed)
FOLLOW UP  Date of Service/Encounter:  01/29/17   Assessment:   Moderate persistent asthma, uncomplicated  Seasonal and perennial allergic rhinitis (grasses, weeds, trees, molds)  Gastroesophageal reflux disease - on Dexilant 60mg  daily   Plan/Recommendations:   1. 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 - I would recommend taking two puffs twice daily to try to preserve your lung function. - Daily controller medication(s): Singulair 10mg  daily and Symbicort 160/4.38mcg two puffs twice daily with spacer - Prior to physical activity: ProAir 2 puffs 10-15 minutes before physical activity. - Rescue medications: ProAir 4 puffs every 4-6 hours as needed - Changes during respiratory infections or worsening symptoms: Add on Qvar to 2 puffs three times daily for ONE TO TWO WEEKS. - Asthma control goals:  * Full participation in all desired activities (may need albuterol before activity) * Albuterol use two time or less a week on average (not counting use with activity) * Cough interfering with sleep two time or less a month * Oral steroids no more than once a year * No hospitalizations  2. Seasonal and perennial allergic rhinitis (grasses, weeds, trees, molds) - Continue with the over the counter medication. - Continue with Rhinocort one spray per nostril daily. - Continue with Singulair 10mg  daily.   3. Gastroesophageal reflux disease - Continue with Dexilant 60mg  in the morning (samples provided)  - Continue with ranitidine 150mg  1-2 tablets in the evening as needed.  4. Return in about 6 months (around 07/29/2017).    Subjective:   Anna Torres is a 47 y.o. female presenting today for follow up of  Chief Complaint  Patient presents with  . Asthma    Anna Torres has a history of the following: Patient Active Problem List   Diagnosis Date Noted  . Contraception  management 05/29/2016  . Adjustment disorder 05/29/2016  . Hypertriglyceridemia 12/25/2015  . Well woman exam 12/11/2015  . Blepharitis 11/08/2015  . Breakthrough bleeding on OCPs 02/10/2015  . Chronic headaches 05/31/2014  . Asthma, chronic 04/26/2014  . 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 Endometrial polyp 03/30/2012  . Menorrhagia 03/23/2012  . CIN I (cervical intraepithelial neoplasia I) 03/23/2012    History obtained from: chart review and patient.  06/26/2014 [facility] Primary Care Provider is 04/01/2014, MD.     Anna Torres is a 47 y.o. female presenting for a follow up visit. She was last seen in May 2018 by Dr. Ulice Dash. At that time, she was doing very well without the need for steroids or antibiotics. Her SABA use was minimal and her reflux was controlled without adverse events. She had established care with the [facility] at [facility]. She was continued on Symbicort 160/4.5 two puffs twice daily as well as Rhinocort one spray per nostril. She was continued on Singulair 10mg  daily and Dexilant in the morning with ranitidine 150mg  two tablets at night if needed. She has Qvar to add during respiratory flares.   Since the last visit, she has done well. Asthma is under good control with the Symbicort 160/4.5 two puffs once daily. She started doing it once daily around May 2018. She was not having side effects to the medication, but she eats a very light dinner and does not want to take any medications if she does not eat a lot of food. She has never tried [place], but she has had side effects  to multiple other controls so she does not want to change it. She does not go to the vocal cord clinic any longer. Symptoms seem to be under good control.   She last had testing in March 2015 that was positive to grasses, weeds, trees, and perennial molds. She remains on the Rhinocort, Singulair, and an over the counter antihistamine from [place]. This seems  to be controlling her symptoms well.   Otherwise, there have been no changes to her past medical history, surgical history, family history, or social history. She separated from her future ex-husband in May 2018, which has help decrease her stress level. She is no longer taking her night time dose of Zantac for her reflux.     Review of Systems: a 14-point review of systems is pertinent for what is mentioned in HPI.  Otherwise, all other systems were negative. Constitutional: negative other than that listed in the HPI Eyes: negative other than that listed in the HPI Ears, nose, mouth, throat, and face: negative other than that listed in the HPI Respiratory: negative other than that listed in the HPI Cardiovascular: negative other than that listed in the HPI Gastrointestinal: negative other than that listed in the HPI Genitourinary: negative other than that listed in the HPI Integument: negative other than that listed in the HPI Hematologic: negative other than that listed in the HPI Musculoskeletal: negative other than that listed in the HPI Neurological: negative other than that listed in the HPI Allergy/Immunologic: negative other than that listed in the HPI    Objective:   Blood pressure 116/76, pulse 96, resp. rate 18, SpO2 95 %. There is no height or weight on file to calculate BMI.   Physical Exam:  General: Alert, interactive, in no acute distress. Pleasant female.  Eyes: No conjunctival injection bilaterally, no discharge on the right, no discharge on the left and no Horner-Trantas dots present. PERRL bilaterally. EOMI without pain. No photophobia.  Ears: Right TM pearly gray with normal light reflex, Left TM pearly gray with normal light reflex, Right TM intact without perforation and Left TM intact without perforation.  Nose/Throat: External nose within normal limits and septum midline. Turbinates edematous and pale with clear discharge. Posterior oropharynx erythematous  without cobblestoning in the posterior oropharynx. Tonsils 2+ without exudates.  Tongue without thrush. Adenopathy: no enlarged lymph nodes appreciated in the anterior cervical, occipital, axillary, epitrochlear, inguinal, or popliteal regions. Lungs: Decreased breath sounds bilaterally without wheezing, rhonchi or rales. No increased work of breathing. CV: Normal S1/S2. No murmurs. Capillary refill <2 seconds.  Skin: Warm and dry, without lesions or rashes. Neuro:   Grossly intact. No focal deficits appreciated. Responsive to questions.   Diagnostic studies:   Spirometry: results abnormal (FEV1: 1.57/63%, FVC: 2.00/66%, FEV1/FVC: 79%).    Spirometry consistent with possible restrictive disease. She declined a nebulizer treatment today. Overall, her FVC has decreased from 2.49 in 2017 to 2.18 in 2018, and now 2.00 now. Her FEV1 has shown a similar decline with 2.05 in 2017, 1.90 in 2018, and 1.57 now.   Allergy Studies: none     Malachi Bonds, MD [facility] [place]

## 2017-01-29 NOTE — Patient Instructions (Addendum)
1. 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 - I would recommend taking two puffs twice daily to try to preserve your lung function. - Daily controller medication(s): Singulair 10mg  daily and Symbicort 160/4.50mcg two puffs twice daily with spacer - Prior to physical activity: ProAir 2 puffs 10-15 minutes before physical activity. - Rescue medications: ProAir 4 puffs every 4-6 hours as needed - Changes during respiratory infections or worsening symptoms: Add on Qvar 4m to 2 puffs three times daily for ONE TO TWO WEEKS. - Asthma control goals:  * Full participation in all desired activities (may need albuterol before activity) * Albuterol use two time or less a week on average (not counting use with activity) * Cough interfering with sleep two time or less a month * Oral steroids no more than once a year * No hospitalizations  2. Seasonal and perennial allergic rhinitis (grasses, weeds, trees, molds) - Continue with the over the counter medication. - Continue with Rhinocort one spray per nostril daily. - Continue with Singulair 10mg  daily.   3. Gastroesophageal reflux disease - Continue with Dexilant 60mg  in the morning (samples provided)  - Continue with ranitidine 150mg  1-2 tablets in the evening as needed.  4. Return in about 6 months (around 07/29/2017).    Please inform of any Emergency Department visits, hospitalizations, or changes in symptoms. Call before going to the ED for breathing or allergy symptoms since we might be able to fit you in for a sick visit. Feel free to contact anytime with any questions, problems, or concerns.  It was a pleasure to meet you today! Happy New Year!   Websites that have reliable patient information: 1. American Academy of Asthma, Allergy, and Immunology: www.aaaai.org 2. Food Allergy Research and Education (FARE): foodallergy.org 3. Mothers of  Asthmatics: http://www.asthmacommunitynetwork.org 4. American College of Allergy, Asthma, and Immunology: www.acaai.org

## 2017-02-03 ENCOUNTER — Ambulatory Visit (INDEPENDENT_AMBULATORY_CARE_PROVIDER_SITE_OTHER): Payer: BLUE CROSS/BLUE SHIELD | Admitting: Family Medicine

## 2017-02-03 ENCOUNTER — Encounter: Payer: Self-pay | Admitting: Family Medicine

## 2017-02-03 VITALS — BP 116/70 | HR 111 | Temp 98.1°F | Ht 62.0 in | Wt 121.6 lb

## 2017-02-03 DIAGNOSIS — Z01419 Encounter for gynecological examination (general) (routine) without abnormal findings: Secondary | ICD-10-CM

## 2017-02-03 DIAGNOSIS — Z0000 Encounter for general adult medical examination without abnormal findings: Secondary | ICD-10-CM

## 2017-02-03 DIAGNOSIS — J45909 Unspecified asthma, uncomplicated: Secondary | ICD-10-CM

## 2017-02-03 DIAGNOSIS — E781 Pure hyperglyceridemia: Secondary | ICD-10-CM

## 2017-02-03 MED ORDER — NORGESTIMATE-ETH ESTRADIOL 0.25-35 MG-MCG PO TABS: ORAL_TABLET | ORAL | 3 refills | Status: DC

## 2017-02-03 NOTE — Progress Notes (Signed)
Subjective:   Patient ID: Anna Torres, female    DOB: 05-31-1970, 47 y.o.   MRN: [id number]  Anna Torres is a pleasant 47 y.o. year old female who presents to clinic today with Annual Exam (Patient is here today for a CPE without PAP.  She had fasting labs completed 1.9.19.  She had her flu shot in Sept.  She will call [facility] to set up her Mammogram.)  on 02/03/2017  HPI:  Had GYN- Dr. Lily Peer but I took over the gynecological care several years ago. Last pap smear 12/25/15-done by me.   Does have a h/o CIN I- has been over 5 years. Mammogram 01/05/15  Asthma- has been well controlled. Just went to her lung doctor last week.  Pleased with current OCPs.  Wt Readings from Last 3 Encounters:  02/03/17 121 lb 9.6 oz (55.2 kg)  05/29/16 123 lb (55.8 kg)  12/25/15 123 lb 4 oz (55.9 kg)   Hypertriglyceridemia  Very elevated this month.  Admits to eating pasta for almost every meal lately.  Lab Results  Component Value Date   CHOL 208 (H) 01/29/2017   HDL 60.30 01/29/2017   LDLCALC 109 (H) 12/21/2014   LDLDIRECT 105.0 01/29/2017   TRIG (H) 01/29/2017    400.0 Triglyceride is over 400; calculations on Lipids are invalid.   CHOLHDL 3 01/29/2017     Lab Results  Component Value Date   ALT 20 01/29/2017   AST 28 01/29/2017   ALKPHOS 64 01/29/2017   BILITOT 0.7 01/29/2017   Lab Results  Component Value Date   WBC 9.0 01/29/2017   HGB 13.5 01/29/2017   HCT 40.7 01/29/2017   MCV 81.9 01/29/2017   PLT 406.0 (H) 01/29/2017   Lab Results  Component Value Date   TSH 2.05 01/29/2017    Lab Results  Component Value Date   CHOL 208 (H) 01/29/2017   HDL 60.30 01/29/2017   LDLCALC 109 (H) 12/21/2014   LDLDIRECT 105.0 01/29/2017   TRIG (H) 01/29/2017    400.0 Triglyceride is over 400; calculations on Lipids are invalid.   CHOLHDL 3 01/29/2017   Current Outpatient Medications on File Prior to Visit  Medication Sig Dispense Refill  .  acyclovir (ZOVIRAX) 400 MG tablet TAKE ONE TABLET BY MOUTH EVERY OTHER DAY 30 tablet 4  . budesonide (RHINOCORT ALLERGY) 32 MCG/ACT nasal spray Place 1 spray into both nostrils daily.    . budesonide-formoterol (SYMBICORT) 160-4.5 MCG/ACT inhaler INHALE 2 PUFFS EVERY 12 HOURS TO PREVENT COUGH OR WHEEZING..RINSE, GARGLE, AND SPIT AFTER EACH USE 10.2 g 0  . dexlansoprazole (DEXILANT) 60 MG capsule Take 60 mg by mouth daily.    Marland Kitchen glucosamine-chondroitin 500-400 MG tablet Take 1 tablet by mouth daily.    . montelukast (SINGULAIR) 10 MG tablet Take 1 tablet (10 mg total) by mouth at bedtime. 30 tablet 0  . norgestimate-ethinyl estradiol (SPRINTEC 28) 0.25-35 MG-MCG tablet TAKE 1 TABLET BY MOUTH DAILY 3 Package 3   No current facility-administered medications on file prior to visit.     Allergies  Allergen Reactions  . Codeine Nausea And Vomiting  . Hydrocodone Nausea And Vomiting  . Oxycodone Nausea And Vomiting  . Sumatriptan Other (See Comments)    Heart palpitations,SOB, chest pressure and shakiness    Past Medical History:  Diagnosis Date  . Asthma   . Asthma    last episode january 2015  . CIN I (cervical intraepithelial neoplasia I)   . Environmental and  seasonal allergies   . GERD (gastroesophageal reflux disease)   . Headache   . Herpes genitalis in women   . Ureteral reflux 1975  . Vitamin D deficiency     Past Surgical History:  Procedure Laterality Date  . 24 HOUR PH STUDY N/A 09/18/2015   Procedure: 24 HOUR PH STUDY;  Surgeon: Willis Modena, MD;  Location: [facility];  Service: Endoscopy;  Laterality: N/A;  . ANTERIOR CERVICAL DECOMP/DISCECTOMY FUSION N/A 01/31/2014   Procedure: ANTERIOR CERVICAL DECOMPRESSION/DISCECTOMY FUSION CERVICAL FIVE-SIX,CERVICAL SIX-SEVEN WITH HARDWARE REMOVAL OF CODMAN PLATE AT CERVIAL FOUR-FIVE;  Surgeon: Mariam Dollar, MD;  Location: [facility] OR;  Service: Neurosurgery;  Laterality: N/A;  . BACK SURGERY Bilateral 2001   C4-C5 DISK "fusion"  .  CERVICAL SPINE SURGERY    . DILATATION & CURETTAGE/HYSTEROSCOPY WITH TRUECLEAR     3'14-endometrial polyp-Womens  . DILATION AND CURETTAGE OF UTERUS    . ESOPHAGEAL MANOMETRY N/A 05/24/2013   Procedure: ESOPHAGEAL MANOMETRY (EM);  Surgeon: Willis Modena, MD;  Location: [facility];  Service: Endoscopy;  Laterality: N/A;  . ESOPHAGOGASTRODUODENOSCOPY    . ESOPHAGOGASTRODUODENOSCOPY (EGD) WITH PROPOFOL N/A 05/24/2013   Procedure: ESOPHAGOGASTRODUODENOSCOPY (EGD) WITH PROPOFOL;  Surgeon: Willis Modena, MD;  Location: [facility];  Service: Endoscopy;  Laterality: N/A;  . EXTERNAL EAR SURGERY     bilateral "pinning" age 40  . MANDIBLE FRACTURE SURGERY  1990   retained hardware "wires"  . MANDIBLE SURGERY      Family History  Problem Relation Age of Onset  . Diabetes Mother   . Rheum arthritis Mother   . Arthritis Mother   . Cancer Mother   . Graves' disease Sister     Social History   Socioeconomic History  . Marital status: Married    Spouse name: Not on file  . Number of children: Not on file  . Years of education: Not on file  . Highest education Torres: Not on file  Social Needs  . Financial resource strain: Not on file  . Food insecurity - worry: Not on file  . Food insecurity - inability: Not on file  . Transportation needs - medical: Not on file  . Transportation needs - non-medical: Not on file  Occupational History  . Not on file  Tobacco Use  . Smoking status: Never Smoker  . Smokeless tobacco: Never Used  Substance and Sexual Activity  . Alcohol use: Yes    Alcohol/week: 0.0 oz    Comment: rare social  . Drug use: No  . Sexual activity: Yes    Birth control/protection: Pill  Other Topics Concern  . Not on file  Social History Narrative   ** Merged History Encounter **       The PMH, PSH, Social History, Family History, Medications, and allergies have been reviewed in [facility], and have been updated if relevant.    Review of Systems  Constitutional:  Negative.   HENT: Negative.   Eyes: Negative.   Respiratory: Negative.   Cardiovascular: Negative.   Gastrointestinal: Negative.   Endocrine: Negative.   Genitourinary: Negative.   Musculoskeletal: Negative.   Skin: Negative.   Allergic/Immunologic: Negative.   Neurological: Negative.   Hematological: Negative.   Psychiatric/Behavioral: Negative.   All other systems reviewed and are negative.      Objective:    BP 116/70 (BP Location: Left Arm, Patient Position: Sitting, Cuff Size: Normal)   Pulse (!) 111   Temp 98.1 F (36.7 C) (Oral)   Ht 5\' 2"  (1.575 m)  Wt 121 lb 9.6 oz (55.2 kg)   LMP 12/23/2016   SpO2 97%   BMI 22.24 kg/m   Wt Readings from Last 3 Encounters:  02/03/17 121 lb 9.6 oz (55.2 kg)  05/29/16 123 lb (55.8 kg)  12/25/15 123 lb 4 oz (55.9 kg)    Physical Exam    General:  Well-developed,well-nourished,in no acute distress; alert,appropriate and cooperative throughout examination Head:  normocephalic and atraumatic.   Eyes:  vision grossly intact, PERRL Ears:  R ear normal and L ear normal externally, TMs clear bilaterally Nose:  no external deformity.   Mouth:  good dentition.   Neck:  No deformities, masses, or tenderness noted. Breasts:  No mass, nodules, thickening, tenderness, bulging, retraction, inflamation, nipple discharge or skin changes noted.   Lungs:  Normal respiratory effort, chest expands symmetrically. Lungs are clear to auscultation, no crackles or wheezes. Heart:  Normal rate and regular rhythm. S1 and S2 normal without gallop, murmur, click, rub or other extra sounds. Abdomen:  Bowel sounds positive,abdomen soft and non-tender without masses, organomegaly or hernias noted. Msk:  No deformity or scoliosis noted of thoracic or lumbar spine.   Extremities:  No clubbing, cyanosis, edema, or deformity noted with normal full range of motion of all joints.   Neurologic:  alert & oriented X3 and gait normal.   Skin:  Intact without  suspicious lesions or rashes Cervical Nodes:  No lymphadenopathy noted Axillary Nodes:  No palpable lymphadenopathy Psych:  Cognition and judgment appear intact. Alert and cooperative with normal attention span and concentration. No apparent delusions, illusions, hallucinations  Assessment & Plan:   Well woman exam No Follow-up on file.

## 2017-02-03 NOTE — Assessment & Plan Note (Signed)
Reviewed preventive care protocols, scheduled due services, and updated immunizations Discussed nutrition, exercise, diet, and healthy lifestyle.  She will call to schedule her mammogram.

## 2017-02-03 NOTE — Patient Instructions (Signed)
Great to see you. Triglycerides are too high.  Decrease added sugars, eliminate trans fats, increase fiber and limit alcohol.  All these changes together can drop triglycerides by almost 50%. Please return in 3 months for labs.  Please schedule your mammogram.

## 2017-02-03 NOTE — Assessment & Plan Note (Signed)
She would like to work on diet and repeat lipid panel in 12 weeks. Orders entered.

## 2017-02-03 NOTE — Assessment & Plan Note (Signed)
Stable.  No changes made. 

## 2017-02-06 DIAGNOSIS — K219 Gastro-esophageal reflux disease without esophagitis: Secondary | ICD-10-CM | POA: Diagnosis not present

## 2017-02-06 DIAGNOSIS — K7581 Nonalcoholic steatohepatitis (NASH): Secondary | ICD-10-CM | POA: Diagnosis not present

## 2017-02-09 ENCOUNTER — Other Ambulatory Visit: Payer: Self-pay | Admitting: Allergy and Immunology

## 2017-03-14 ENCOUNTER — Other Ambulatory Visit: Payer: Self-pay | Admitting: Allergy and Immunology

## 2017-03-16 ENCOUNTER — Other Ambulatory Visit: Payer: Self-pay | Admitting: Family Medicine

## 2017-04-14 ENCOUNTER — Other Ambulatory Visit: Payer: Self-pay | Admitting: Allergy and Immunology

## 2017-04-16 ENCOUNTER — Telehealth: Payer: Self-pay | Admitting: Family Medicine

## 2017-04-16 MED ORDER — ACYCLOVIR 400 MG PO TABS: 400.0000 mg | ORAL_TABLET | ORAL | 2 refills | Status: DC

## 2017-04-16 NOTE — Telephone Encounter (Signed)
Copied from CRM [id number]. Topic: Quick Communication - Rx Refill/Question >> Apr 16, 2017  9:31 AM Lelon Frohlich, RMA wrote: Medication: acyclovir (ZOVIRAX) 400 MG tablet  Has the patient contacted their pharmacy? yes (Agent: If no, request that the patient contact the pharmacy for the refill.) Preferred Pharmacy (with phone number or street name): [id number] - [place], [place] - [address] AT [facility] [address] [phone] (Phone) [phone] (Fax)     Agent: Please be advised that RX refills may take up to 3 business days. We ask that you follow-up with your pharmacy.

## 2017-04-16 NOTE — Telephone Encounter (Signed)
acyclovir LOV: 02/03/17 PCP: Dr Dayton Martes Pharmacy: [facility] Main [address] [place], [place]

## 2017-04-16 NOTE — Telephone Encounter (Signed)
Rx sent in/thx dmf 

## 2017-05-05 ENCOUNTER — Other Ambulatory Visit (INDEPENDENT_AMBULATORY_CARE_PROVIDER_SITE_OTHER): Payer: BLUE CROSS/BLUE SHIELD

## 2017-05-05 ENCOUNTER — Other Ambulatory Visit: Payer: Self-pay

## 2017-05-05 DIAGNOSIS — E781 Pure hyperglyceridemia: Secondary | ICD-10-CM | POA: Diagnosis not present

## 2017-05-05 LAB — LIPID PANEL
Cholesterol: 209 mg/dL — ABNORMAL HIGH (ref 0–200)
HDL: 55.9 mg/dL (ref >39.00)
Total CHOL/HDL Ratio: 4
Triglycerides: 442 mg/dL — ABNORMAL HIGH (ref 0.0–149.0)

## 2017-05-05 LAB — LDL CHOLESTEROL, DIRECT: Direct LDL: 97 mg/dL

## 2017-05-05 NOTE — Progress Notes (Signed)
Entered lipid panel as last one expired/thx dmf

## 2017-05-10 ENCOUNTER — Other Ambulatory Visit: Payer: Self-pay | Admitting: Allergy and Immunology

## 2017-05-14 ENCOUNTER — Telehealth: Payer: Self-pay

## 2017-05-14 DIAGNOSIS — Z79899 Other long term (current) drug therapy: Secondary | ICD-10-CM

## 2017-05-14 DIAGNOSIS — E781 Pure hyperglyceridemia: Secondary | ICD-10-CM

## 2017-05-14 MED ORDER — FENOFIBRATE 48 MG PO TABS: 48.0000 mg | ORAL_TABLET | Freq: Every day | ORAL | 2 refills | Status: DC

## 2017-05-14 NOTE — Telephone Encounter (Signed)
Tricor 48 mg daily- dispense number 30 with 2 refills.  Repeat fasting lipid panel, CMET in 8 weeks. Thank you!

## 2017-05-14 NOTE — Addendum Note (Signed)
Addended by: Lerry Liner on: 05/14/2017 11:46 AM   Modules accepted: Orders

## 2017-05-14 NOTE — Telephone Encounter (Signed)
TA-Pt agrees to start Tricor for high tri/what dosage and SIG and I will be happy to send in for you/plz advise/thx dmf

## 2017-05-14 NOTE — Telephone Encounter (Signed)
-----   Message from Dianne Dun, MD sent at 05/12/2017 10:00 AM EDT ----- Please call pt- triglycerides unfortunately remain elevated.  I do recommend starting an rx like tricor at this time. Please let me know how she feels about starting this rx.

## 2017-05-14 NOTE — Telephone Encounter (Signed)
Rx sent in per TA/pt aware/she is scheduled for fasting labs on 6.20.19 @ 7:30/future orders placed for CMP & Lipid/thx dmf

## 2017-07-09 ENCOUNTER — Encounter: Payer: Self-pay | Admitting: Family Medicine

## 2017-07-09 ENCOUNTER — Ambulatory Visit: Payer: BLUE CROSS/BLUE SHIELD | Admitting: Family Medicine

## 2017-07-09 DIAGNOSIS — J040 Acute laryngitis: Secondary | ICD-10-CM

## 2017-07-09 NOTE — Progress Notes (Signed)
Anna Torres - 47 y.o. female MRN [id number]  Date of birth: 18-May-1970  Subjective Chief Complaint  Patient presents with  . Sore Throat    started last Friday with sore throat, Taking OTC cough drops and Tussin. Having lots of drainage and coughing.     HPI Anna Torres is a 47 y.o. female here today for same day visit with complaint of sore throat and hoarseness.  Reports that symptoms began about 5 days ago.  Started as sore throat with post nasal drainage and began developing hoarseness a couple of days ago.  She has been using cough drops and started guaifenesin this morning.  She does have a history of asthma but denies wheezing or shortness of breath.  She has not had fever, chills, sinus pain, nausea or vomiting, headache.    ROS:  ROS completed and negative except as noted per HPI.    Allergies  Allergen Reactions  . Codeine Nausea And Vomiting  . Hydrocodone Nausea And Vomiting  . Oxycodone Nausea And Vomiting  . Sumatriptan Other (See Comments)    Heart palpitations,SOB, chest pressure and shakiness    Past Medical History:  Diagnosis Date  . Asthma   . Asthma    last episode january 2015  . CIN I (cervical intraepithelial neoplasia I)   . Environmental and seasonal allergies   . GERD (gastroesophageal reflux disease)   . Headache   . Herpes genitalis in women   . Ureteral reflux 1975  . Vitamin D deficiency     Past Surgical History:  Procedure Laterality Date  . 24 HOUR PH STUDY N/A 09/18/2015   Procedure: 24 HOUR PH STUDY;  Surgeon: Willis Modena, MD;  Location: [facility];  Service: Endoscopy;  Laterality: N/A;  . ANTERIOR CERVICAL DECOMP/DISCECTOMY FUSION N/A 01/31/2014   Procedure: ANTERIOR CERVICAL DECOMPRESSION/DISCECTOMY FUSION CERVICAL FIVE-SIX,CERVICAL SIX-SEVEN WITH HARDWARE REMOVAL OF CODMAN PLATE AT CERVIAL FOUR-FIVE;  Surgeon: Mariam Dollar, MD;  Location[facility] OR;  Service: Neurosurgery;  Laterality: N/A;  . BACK SURGERY Bilateral 2001    C4-C5 DISK "fusion"  . CERVICAL SPINE SURGERY    . DILATATION & CURETTAGE/HYSTEROSCOPY WITH TRUECLEAR     3'14-endometrial polyp-Womens  . DILATION AND CURETTAGE OF UTERUS    . ESOPHAGEAL MANOMETRY N/A 05/24/2013   Procedure: ESOPHAGEAL MANOMETRY (EM);  Surgeon: Willis Modena, MD;  Location: [facility];  Service: Endoscopy;  Laterality: N/A;  . ESOPHAGOGASTRODUODENOSCOPY    . ESOPHAGOGASTRODUODENOSCOPY (EGD) WITH PROPOFOL N/A 05/24/2013   Procedure: ESOPHAGOGASTRODUODENOSCOPY (EGD) WITH PROPOFOL;  Surgeon: Willis Modena, MD;  Location: [facility];  Service: Endoscopy;  Laterality: N/A;  . EXTERNAL EAR SURGERY     bilateral "pinning" age 84  . MANDIBLE FRACTURE SURGERY  1990   retained hardware "wires"  . MANDIBLE SURGERY      Social History   Socioeconomic History  . Marital status: Single    Spouse name: Not on file  . Number of children: Not on file  . Years of education: Not on file  . Highest education level: Not on file  Occupational History  . Not on file  Social Needs  . Financial resource strain: Not on file  . Food insecurity:    Worry: Not on file    Inability: Not on file  . Transportation needs:    Medical: Not on file    Non-medical: Not on file  Tobacco Use  . Smoking status: Never Smoker  . Smokeless tobacco: Never Used  Substance and Sexual Activity  .  Alcohol use: Yes    Alcohol/week: 0.0 oz    Comment: rare social  . Drug use: No  . Sexual activity: Yes    Birth control/protection: Pill  Lifestyle  . Physical activity:    Days per week: Not on file    Minutes per session: Not on file  . Stress: Not on file  Relationships  . Social connections:    Talks on phone: Not on file    Gets together: Not on file    Attends religious service: Not on file    Active member of club or organization: Not on file    Attends meetings of clubs or organizations: Not on file    Relationship status: Not on file  Other Topics Concern  . Not on file  Social  History Narrative   ** Merged History Encounter **        Family History  Problem Relation Age of Onset  . Diabetes Mother   . Rheum arthritis Mother   . Arthritis Mother   . Cancer Mother   . Graves' disease Sister     Health Maintenance  Topic Date Due  . HIV Screening  07/29/1986  . INFLUENZA VACCINE  08/21/2017  . PAP SMEAR  12/25/2018  . TETANUS/TDAP  12/27/2024    ----------------------------------------------------------------------------------------------------------------------------------------------------------------------------------------------------------------- Physical Exam BP 112/84 (BP Location: Left Arm, Patient Position: Sitting, Cuff Size: Normal)   Pulse (!) 102   Temp 98.3 F (36.8 C) (Oral)   Ht 5\' 2"  (1.575 m)   Wt 123 lb 3.2 oz (55.9 kg)   SpO2 96%   BMI 22.53 kg/m   Physical Exam  Constitutional: She is oriented to person, place, and time. She appears well-nourished. She does not appear ill. No distress.  HENT:  Head: Normocephalic and atraumatic.  Right Ear: Tympanic membrane normal.  Left Ear: Tympanic membrane normal.  Mouth/Throat: Uvula is midline, oropharynx is clear and moist and mucous membranes are normal. Uvula swelling (mild uvular edema) present. No oropharyngeal exudate or posterior oropharyngeal edema.  Neck: Neck supple. No thyromegaly present.  Cardiovascular: Normal rate, regular rhythm and normal heart sounds.  Pulmonary/Chest: Effort normal and breath sounds normal.  Lymphadenopathy:    She has no cervical adenopathy.  Neurological: She is alert and oriented to person, place, and time.  Skin: Skin is warm and dry. No rash noted.  Psychiatric: She has a normal mood and affect. Her behavior is normal.     ------------------------------------------------------------------------------------------------------------------------------------------------------------------------------------------------------------------- Assessment and Plan  Laryngitis Likely viral etiology although chronic allergies probably contributing as well.  Continue symptomatic treatment Remain well hydrated Discussed that I would expect her to be improving by 7-10 days She will call if not improving as expected or for any worsening symptoms.

## 2017-07-09 NOTE — Assessment & Plan Note (Signed)
Likely viral etiology although chronic allergies probably contributing as well.  Continue symptomatic treatment Remain well hydrated Discussed that I would expect her to be improving by 7-10 days She will call if not improving as expected or for any worsening symptoms.

## 2017-07-09 NOTE — Patient Instructions (Signed)
Laryngitis Laryngitis is inflammation of your vocal cords. This causes hoarseness, coughing, loss of voice, sore throat, or a dry throat. Your vocal cords are two bands of muscles that are found in your throat. When you speak, these cords come together and vibrate. These vibrations come out through your mouth as sound. When your vocal cords are inflamed, your voice sounds different. Laryngitis can be temporary (acute) or long-term (chronic). Most cases of acute laryngitis improve with time. Chronic laryngitis is laryngitis that lasts for more than three weeks. What are the causes? Acute laryngitis may be caused by:  A viral infection.  Lots of talking, yelling, or singing. This is also called vocal strain.  Bacterial infections.  Chronic laryngitis may be caused by:  Vocal strain.  Injury to your vocal cords.  Acid reflux (gastroesophageal reflux disease or GERD).  Allergies.  Sinus infection.  Smoking.  Alcohol abuse.  Breathing in chemicals or dust.  Growths on the vocal cords.  What increases the risk? Risk factors for laryngitis include:  Smoking.  Alcohol abuse.  Having allergies.  What are the signs or symptoms? Symptoms of laryngitis may include:  Low, hoarse voice.  Loss of voice.  Dry cough.  Sore throat.  Stuffy nose.  How is this diagnosed? Laryngitis may be diagnosed by:  Physical exam.  Throat culture.  Blood test.  Laryngoscopy. This procedure allows your health care provider to look at your vocal cords with a mirror or viewing tube.  How is this treated? Treatment for laryngitis depends on what is causing it. Usually, treatment involves resting your voice and using medicines to soothe your throat. However, if your laryngitis is caused by a bacterial infection, you may need to take antibiotic medicine. If your laryngitis is caused by a growth, you may need to have a procedure to remove it. Follow these instructions at home:  Drink  enough fluid to keep your urine clear or pale yellow.  Breathe in moist air. Use a humidifier if you live in a dry climate.  Take medicines only as directed by your health care provider.  If you were prescribed an antibiotic medicine, finish it all even if you start to feel better.  Do not smoke cigarettes or electronic cigarettes. If you need help quitting, ask your health care provider.  Talk as little as possible. Also avoid whispering, which can cause vocal strain.  Write instead of talking. Do this until your voice is back to normal. Contact a health care provider if:  You have a fever.  You have increasing pain.  You have difficulty swallowing. Get help right away if:  You cough up blood.  You have trouble breathing. This information is not intended to replace advice given to you by your health care provider. Make sure you discuss any questions you have with your health care provider. Document Released: 01/07/2005 Document Revised: 06/15/2015 Document Reviewed: 06/22/2013 Elsevier Interactive Patient Education  2018 [employer] Inc[employer]  

## 2017-07-10 ENCOUNTER — Other Ambulatory Visit (INDEPENDENT_AMBULATORY_CARE_PROVIDER_SITE_OTHER): Payer: BLUE CROSS/BLUE SHIELD

## 2017-07-10 DIAGNOSIS — E781 Pure hyperglyceridemia: Secondary | ICD-10-CM

## 2017-07-10 DIAGNOSIS — Z79899 Other long term (current) drug therapy: Secondary | ICD-10-CM | POA: Diagnosis not present

## 2017-07-10 LAB — COMPREHENSIVE METABOLIC PANEL
ALK PHOS: 57 U/L (ref 39–117)
ALT: 17 U/L (ref 0–35)
AST: 18 U/L (ref 0–37)
Albumin: 4 g/dL (ref 3.5–5.2)
BUN: 7 mg/dL (ref 6–23)
CHLORIDE: 106 meq/L (ref 96–112)
CO2: 23 meq/L (ref 19–32)
Calcium: 9.3 mg/dL (ref 8.4–10.5)
Creatinine, Ser: 0.82 mg/dL (ref 0.40–1.20)
GFR: 79.79 mL/min (ref >60.00)
GLUCOSE: 76 mg/dL (ref 70–99)
Potassium: 4 mEq/L (ref 3.5–5.1)
Sodium: 137 mEq/L (ref 135–145)
Total Bilirubin: 0.5 mg/dL (ref 0.2–1.2)
Total Protein: 7.7 g/dL (ref 6.0–8.3)

## 2017-07-10 LAB — LIPID PANEL
CHOL/HDL RATIO: 3
Cholesterol: 183 mg/dL (ref 0–200)
HDL: 64.6 mg/dL (ref >39.00)
NONHDL: 118.8
TRIGLYCERIDES: 268 mg/dL — AB (ref 0.0–149.0)
VLDL: 53.6 mg/dL — AB (ref 0.0–40.0)

## 2017-07-10 LAB — LDL CHOLESTEROL, DIRECT: Direct LDL: 90 mg/dL

## 2017-07-11 ENCOUNTER — Other Ambulatory Visit: Payer: Self-pay | Admitting: Family Medicine

## 2017-07-11 ENCOUNTER — Other Ambulatory Visit: Payer: Self-pay | Admitting: Allergy & Immunology

## 2017-07-11 MED ORDER — MONTELUKAST SODIUM 10 MG PO TABS: 10.0000 mg | ORAL_TABLET | Freq: Every day | ORAL | 0 refills | Status: DC

## 2017-07-11 NOTE — Telephone Encounter (Signed)
Pt needs a fuv gave pt a courtesy refill. Called pt and informed she needs a fuv.

## 2017-07-29 ENCOUNTER — Ambulatory Visit: Payer: BLUE CROSS/BLUE SHIELD | Admitting: Allergy and Immunology

## 2017-07-29 ENCOUNTER — Encounter: Payer: Self-pay | Admitting: Allergy and Immunology

## 2017-07-29 VITALS — BP 114/70 | HR 92 | Resp 16

## 2017-07-29 DIAGNOSIS — J3089 Other allergic rhinitis: Secondary | ICD-10-CM

## 2017-07-29 DIAGNOSIS — J4540 Moderate persistent asthma, uncomplicated: Secondary | ICD-10-CM

## 2017-07-29 DIAGNOSIS — K219 Gastro-esophageal reflux disease without esophagitis: Secondary | ICD-10-CM | POA: Diagnosis not present

## 2017-07-29 NOTE — Patient Instructions (Addendum)
  1. Continue Symbicort 160 - 2 inhalations twice a day  2. Continue Flonase / Nasacort / Rhinocort one spray each nostril daily  3. Continue montelukast 10 mg tablet 1 time per day  4. Continue Dexilant 60mg  in the morning and can add ranitidine 150 mg two tablets in the evening if needed  5. Continue ProAir HFA and antihistamine if needed  6. Continue action plan for asthma flare including adding high dose Qvar 80 Redihaler at 3 inhalations 3 times per day to Symbicort  7. Obtain fall flu vaccine  8. Return to clinic in 6 months or earlier if problem

## 2017-07-29 NOTE — Progress Notes (Signed)
Follow-up Note  Referring Provider: Dianne Dun, MD Primary Provider: Dianne Dun, MD Date of Office Visit: 07/29/2017  Subjective:   Anna Torres (DOB: 09/20/1970) is a 47 y.o. female who [facility] in re-evaluation of the following:  HPI: Anna Torres returns to this clinic in reevaluation of her asthma and allergic rhinitis and reflux with LPR.  Her last visit in this clinic was with Dr. Dellis Anes on 29 January 2017.  Overall she has really done very well regarding her asthma.  She consistently uses Symbicort and montelukast and has not had to use a short acting bronchodilator and can exercise without any difficulty and has not had administration of a systemic steroid to treat an exacerbation of asthma.  Her nose appears to be doing quite well while consistently using a nasal steroid and a leukotriene modifier and a antihistamine.  She has not required the administration of an antibiotic to treat an episode of sinusitis.  Reflux is under excellent control and she has had very little issues with her throat while using Dexilant on a consistent basis.  She no longer requires the use of ranitidine.  She did have a problem with esophageal dysmotility in the past but she now has rare episodes of swallowing disorder may be less than 1 time per month.  Allergies as of 07/29/2017      Reactions   Codeine Nausea And Vomiting   Hydrocodone Nausea And Vomiting   Oxycodone Nausea And Vomiting   Sumatriptan Other (See Comments)   Heart palpitations,SOB, chest pressure and shakiness      Medication List      acyclovir 400 MG tablet Commonly known as:  ZOVIRAX TAKE 1 TABLET(400 MG) BY MOUTH EVERY OTHER DAY   dexlansoprazole 60 MG capsule Commonly known as:  DEXILANT Take 60 mg by mouth daily.   fenofibrate 48 MG tablet Commonly known as:  TRICOR Take 1 tablet (48 mg total) by mouth daily.   montelukast 10 MG tablet Commonly known as:   SINGULAIR Take 1 tablet (10 mg total) by mouth at bedtime.   norgestimate-ethinyl estradiol 0.25-35 MG-MCG tablet Commonly known as:  SPRINTEC 28 TAKE 1 TABLET BY MOUTH DAILY   RHINOCORT ALLERGY 32 MCG/ACT nasal spray Generic drug:  budesonide Place 1 spray into both nostrils daily.   SYMBICORT 160-4.5 MCG/ACT inhaler Generic drug:  budesonide-formoterol INHALE 2 PUFFS BY MOUTH EVERY 12 HOURS TO PREVENT COUGH OR WHEEZING, RINSE, GARGLE, AND SPIT AFTER EACH USE       Past Medical History:  Diagnosis Date  . Asthma   . Asthma    last episode january 2015  . CIN I (cervical intraepithelial neoplasia I)   . Environmental and seasonal allergies   . GERD (gastroesophageal reflux disease)   . Headache   . Herpes genitalis in women   . Ureteral reflux 1975  . Vitamin D deficiency     Past Surgical History:  Procedure Laterality Date  . 24 HOUR PH STUDY N/A 09/18/2015   Procedure: 24 HOUR PH STUDY;  Surgeon: Willis Modena, MD;  Location: [facility];  Service: Endoscopy;  Laterality: N/A;  . ANTERIOR CERVICAL DECOMP/DISCECTOMY FUSION N/A 01/31/2014   Procedure: ANTERIOR CERVICAL DECOMPRESSION/DISCECTOMY FUSION CERVICAL FIVE-SIX,CERVICAL SIX-SEVEN WITH HARDWARE REMOVAL OF CODMAN PLATE AT CERVIAL FOUR-FIVE;  Surgeon: Mariam Dollar, MD;  Location: [facility] OR;  Service: Neurosurgery;  Laterality: N/A;  . BACK SURGERY Bilateral 2001   C4-C5 DISK "fusion"  .  CERVICAL SPINE SURGERY    . DILATATION & CURETTAGE/HYSTEROSCOPY WITH TRUECLEAR     3'14-endometrial polyp-Womens  . DILATION AND CURETTAGE OF UTERUS    . ESOPHAGEAL MANOMETRY N/A 05/24/2013   Procedure: ESOPHAGEAL MANOMETRY (EM);  Surgeon: Willis Modena, MD;  [facility]: [facility];  Service: Endoscopy;  Laterality: N/A;  . ESOPHAGOGASTRODUODENOSCOPY    . ESOPHAGOGASTRODUODENOSCOPY (EGD) WITH PROPOFOL N/A 05/24/2013   Procedure: ESOPHAGOGASTRODUODENOSCOPY (EGD) WITH PROPOFOL;  Surgeon: Willis Modena, MD;  Location: [facility];  Service:  Endoscopy;  Laterality: N/A;  . EXTERNAL EAR SURGERY     bilateral "pinning" age 89  . MANDIBLE FRACTURE SURGERY  1990   retained hardware "wires"  . MANDIBLE SURGERY      Review of systems negative except as noted in HPI / PMHx or noted below:  Review of Systems  Constitutional: Negative.   HENT: Negative.   Eyes: Negative.   Respiratory: Negative.   Cardiovascular: Negative.   Gastrointestinal: Negative.   Genitourinary: Negative.   Musculoskeletal: Negative.   Skin: Negative.   Neurological: Negative.   Endo/Heme/Allergies: Negative.   Psychiatric/Behavioral: Negative.      Objective:   Vitals:   07/29/17 0948  BP: 114/70  Pulse: 92  Resp: 16          Physical Exam  HENT:  Head: Normocephalic.  Right Ear: Tympanic membrane, external ear and ear canal normal.  Left Ear: Tympanic membrane, external ear and ear canal normal.  Nose: Nose normal. No mucosal edema or rhinorrhea.  Mouth/Throat: Uvula is midline, oropharynx is clear and moist and mucous membranes are normal. No oropharyngeal exudate.  Eyes: Conjunctivae are normal.  Neck: Trachea normal. No tracheal tenderness present. No tracheal deviation present. No thyromegaly present.  Cardiovascular: Normal rate, regular rhythm, S1 normal, S2 normal and normal heart sounds.  No murmur heard. Pulmonary/Chest: Breath sounds normal. No stridor. No respiratory distress. She has no wheezes. She has no rales.  Musculoskeletal: She exhibits no edema.  Lymphadenopathy:       Head (right side): No tonsillar adenopathy present.       Head (left side): No tonsillar adenopathy present.    She has no cervical adenopathy.  Neurological: She is alert.  Skin: No rash noted. She is not diaphoretic. No erythema. Nails show no clubbing.    Diagnostics:    Spirometry was performed and demonstrated an FEV1 of 1.79 at 67 % of predicted.  The patient had an Asthma Control Test with the following results: ACT Total Score: 25.      Assessment and Plan:   1. Asthma, moderate persistent, well-controlled   2. Other allergic rhinitis   3. LPRD (laryngopharyngeal reflux disease)     1. Continue Symbicort 160 - 2 inhalations twice a day  2. Continue Flonase / Nasacort / Rhinocort one spray each nostril daily  3. Continue montelukast 10 mg tablet 1 time per day  4. Continue Dexilant 60mg  in the morning and can add ranitidine 150 mg two tablets in the evening if needed  5. Continue ProAir HFA and antihistamine if needed  6. Continue action plan for asthma flare including adding high dose Qvar 80 Redihaler at 3 inhalations 3 times per day to Symbicort  7. Obtain fall flu vaccine  8. Return to clinic in 6 months or earlier if problem  appears to be doing quite well regarding her atopic disease and her reflux induced respiratory disease on her current medical therapy which includes anti-inflammatory agents for her respiratory tract and  treatment directed against reflux.  She will remain on this plan and I will see her back in this clinic in 6 months or earlier if there is a problem.  Laurette Schimke, MD Allergy / Immunology [facility]

## 2017-07-30 ENCOUNTER — Encounter: Payer: Self-pay | Admitting: Allergy and Immunology

## 2017-08-05 ENCOUNTER — Encounter: Payer: Self-pay | Admitting: Family Medicine

## 2017-08-05 ENCOUNTER — Other Ambulatory Visit: Payer: Self-pay

## 2017-08-05 MED ORDER — FENOFIBRATE 48 MG PO TABS: 48.0000 mg | ORAL_TABLET | Freq: Every day | ORAL | 2 refills | Status: DC

## 2017-08-07 ENCOUNTER — Other Ambulatory Visit: Payer: Self-pay | Admitting: Allergy & Immunology

## 2017-08-15 ENCOUNTER — Encounter: Payer: Self-pay | Admitting: Family Medicine

## 2017-08-27 IMAGING — RF DG ESOPHAGUS
13 of 16 series · 19 of 24 positions shown · non-contrast
Comparison: Barium swallow of 02/18/2013

CLINICAL DATA: Dysphagia

EXAM:
ESOPHOGRAM / BARIUM SWALLOW / BARIUM TABLET STUDY
TECHNIQUE: Combined double contrast and single contrast examination performed
using effervescent crystals, thick barium liquid, and thin barium
liquid. The patient was observed with fluoroscopy swallowing a 13 mm
barium sulphate tablet.
FLUOROSCOPY TIME:  Radiation Exposure Index (as provided by the
fluoroscopic device): 21 deciGy per square cm
If the device does not provide the exposure index:
Fluoroscopy Time:  1 minutes 30 seconds
Number of Acquired Images:

[Series 4: run · 1 of 1 slices shown (1 of 13)]
[im 1/1]
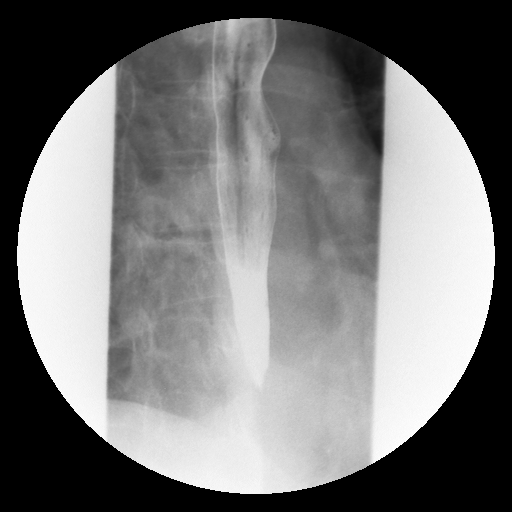

[Series 5: run · 1 of 1 slices shown (2 of 13)]
[im 1/1]
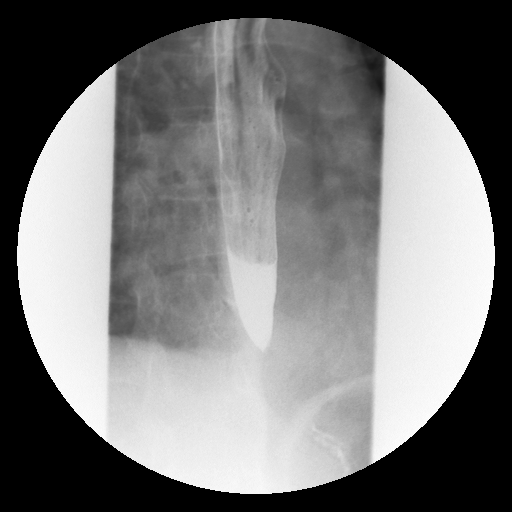

[Series 7: run · 1 of 1 slices shown (3 of 13)]
[im 1/1]
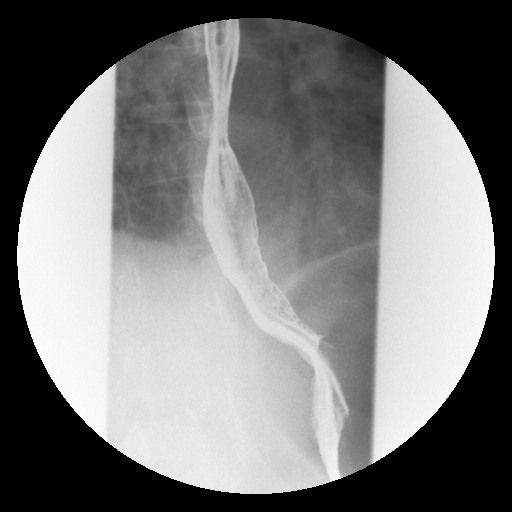

[Series 9: run · 3 of 6 slices shown (4 of 13)]
[im 1/6]
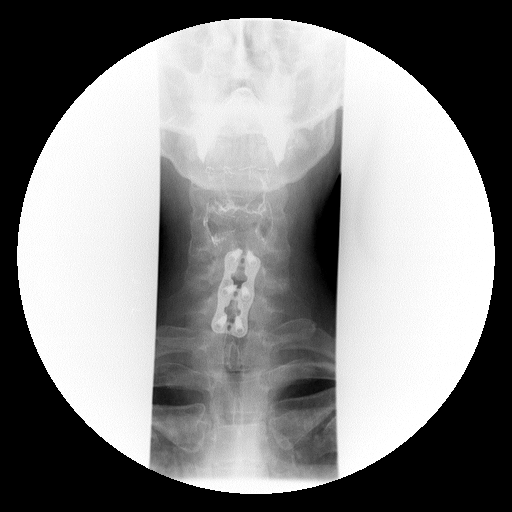
[im 2/6]
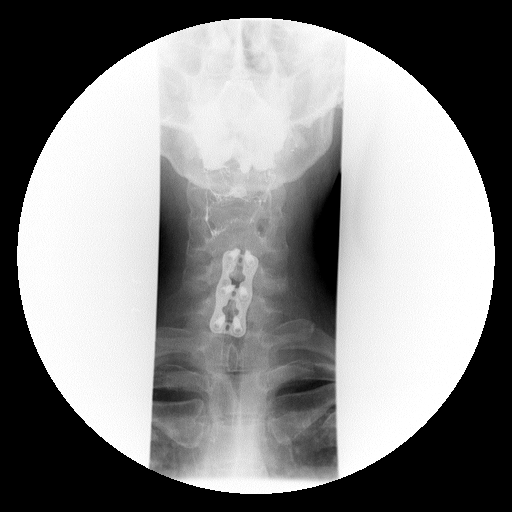
[im 4/6]
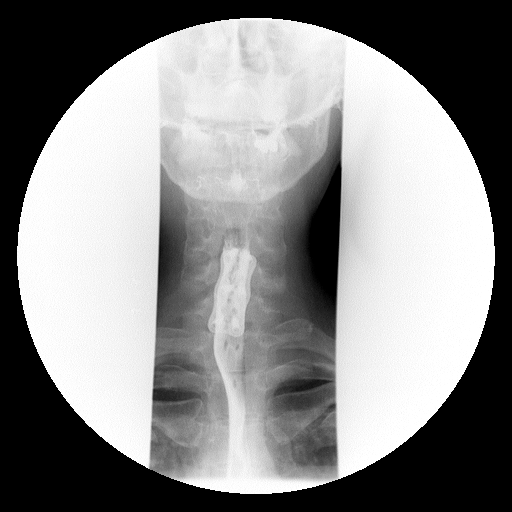

[Series 10: run · 5 of 9 slices shown (5 of 13)]
[im 1/9]
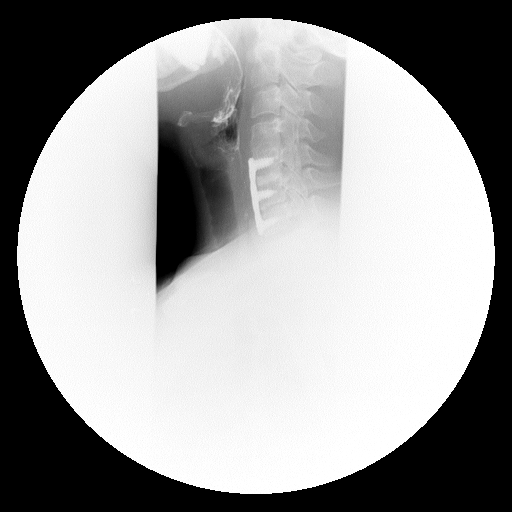
[im 2/9]
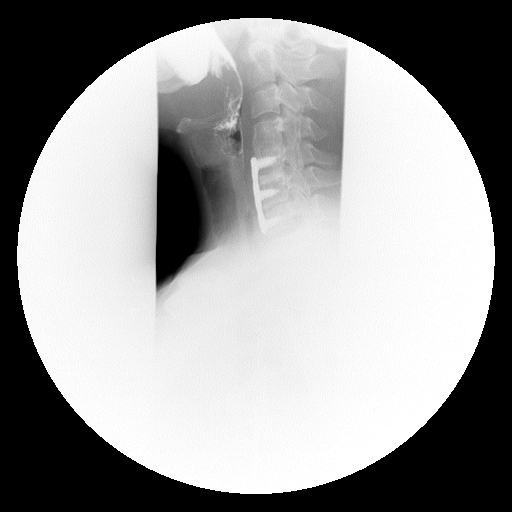
[im 4/9]
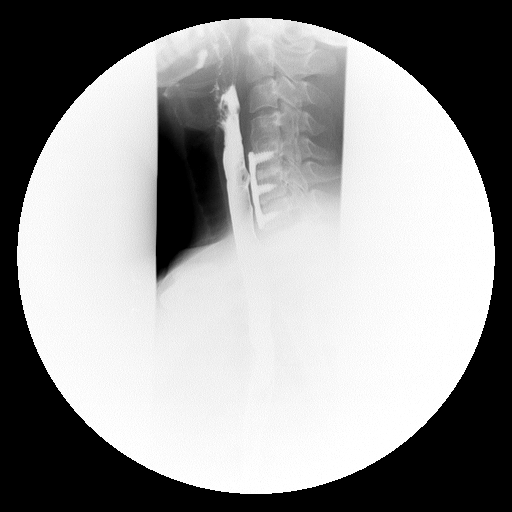
[im 7/9]
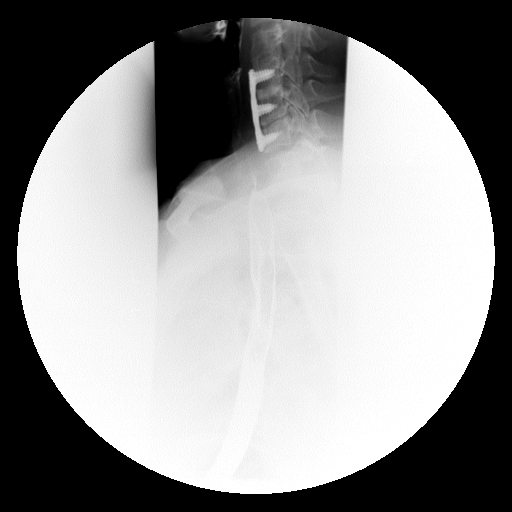
[im 9/9]
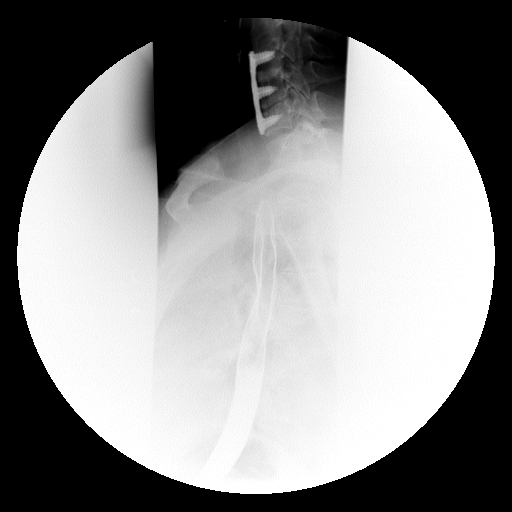

[Series 11: run · 1 of 1 slices shown (6 of 13)]
[im 1/1]
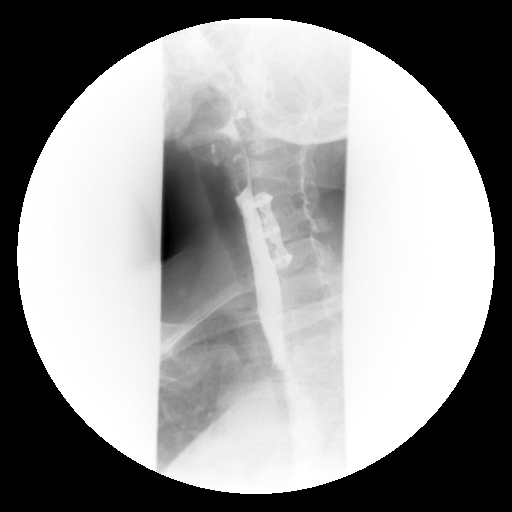

[Series 12: run · 1 of 1 slices shown (7 of 13)]
[im 1/1]
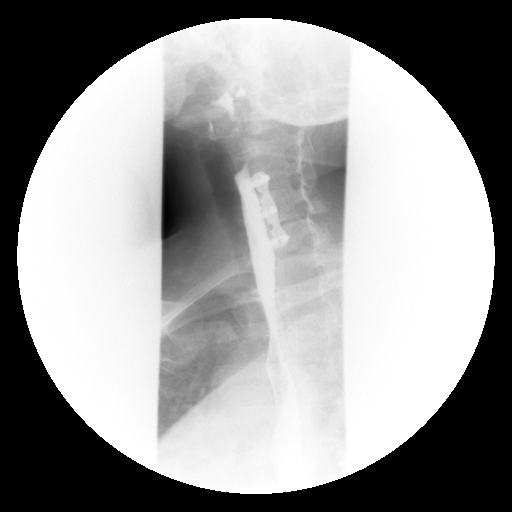

[Series 15: run · 1 of 1 slices shown (8 of 13)]
[im 1/1]
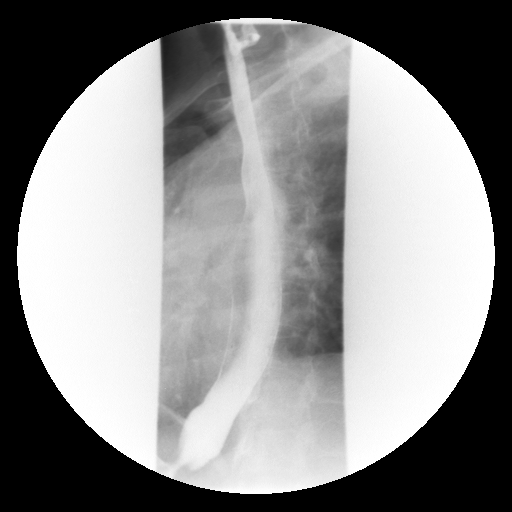

[Series 16: run · 1 of 1 slices shown (9 of 13)]
[im 1/1]
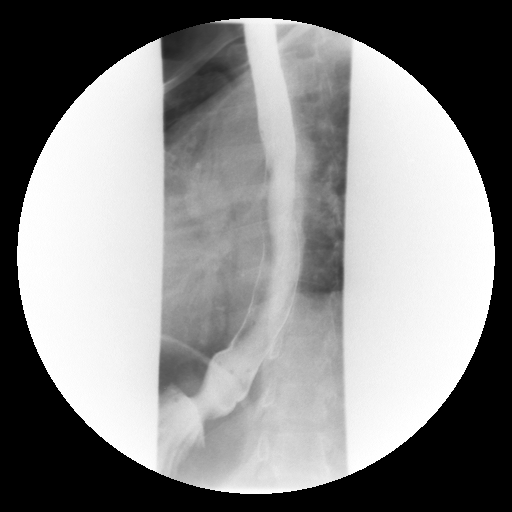

[Series 17: run · 1 of 1 slices shown (10 of 13)]
[im 1/1]
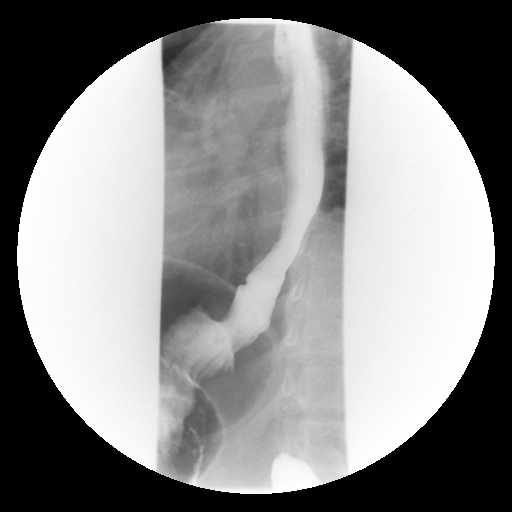

[Series 18: run · 1 of 1 slices shown (11 of 13)]
[im 1/1]
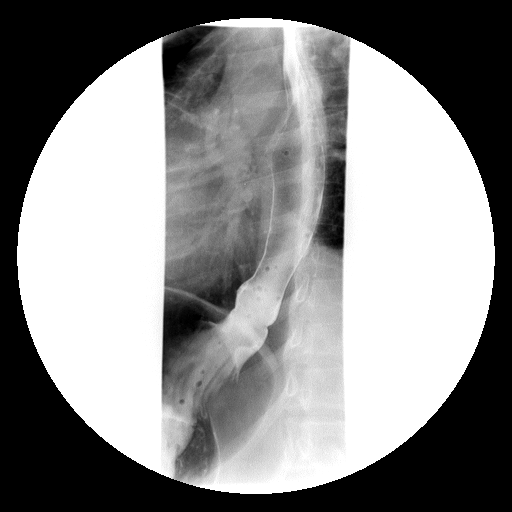

[Series 21: run · 1 of 1 slices shown (12 of 13)]
[im 1/1]
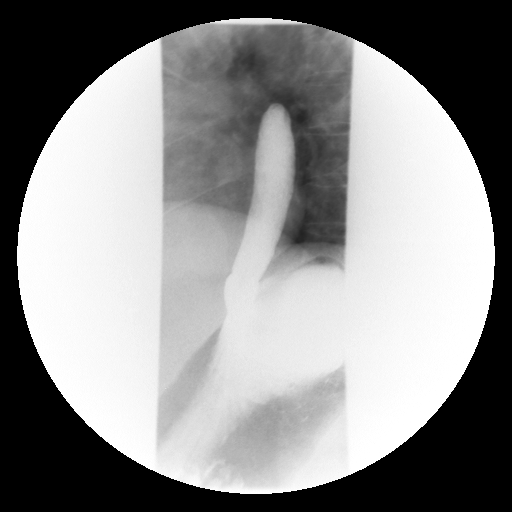

[Series 22: run · 1 of 1 slices shown (13 of 13)]
[im 1/1]
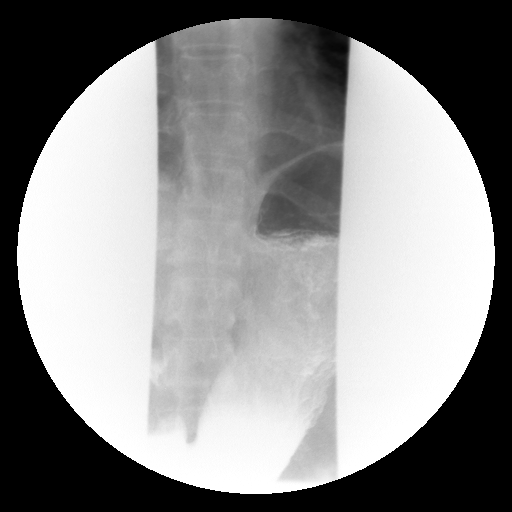

[19 of 24 positions shown; findings below may reference images not displayed]

FINDINGS: A double-contrast barium swallow was performed. The mucosa of the
esophagus is unremarkable. A single contrast study shows the
swallowing mechanism to be unremarkable. Esophageal peristalsis
appears normal. There is a small hiatal hernia again noted. Moderate
gastroesophageal reflux is demonstrated. A barium pill was given at
the end of the study which passed into the stomach without delay.
IMPRESSION: 1. Small hiatal hernia with moderate gastroesophageal reflux.
2. Barium pill passes into the stomach without delay.
3. Normal esophageal peristaltic motion.

## 2017-09-09 ENCOUNTER — Other Ambulatory Visit: Payer: Self-pay | Admitting: Family Medicine

## 2017-09-17 ENCOUNTER — Encounter: Payer: Self-pay | Admitting: Family Medicine

## 2017-09-17 MED ORDER — NORGESTIMATE-ETH ESTRADIOL 0.25-35 MG-MCG PO TABS: ORAL_TABLET | ORAL | 1 refills | Status: DC

## 2017-10-08 ENCOUNTER — Other Ambulatory Visit: Payer: Self-pay | Admitting: Family Medicine

## 2017-10-08 ENCOUNTER — Encounter: Payer: Self-pay | Admitting: Family Medicine

## 2017-10-08 NOTE — Telephone Encounter (Signed)
Patient send email requesting 30 days supply instead 15 tab. Please advise, ok to send in as pt request?last ov with Dr. Dayton Martes 01/2017

## 2017-10-27 ENCOUNTER — Other Ambulatory Visit: Payer: Self-pay

## 2017-10-27 ENCOUNTER — Ambulatory Visit: Payer: BLUE CROSS/BLUE SHIELD | Attending: Internal Medicine | Admitting: Physical Therapy

## 2017-10-27 DIAGNOSIS — M542 Cervicalgia: Secondary | ICD-10-CM

## 2017-10-27 NOTE — Patient Instructions (Signed)
Flexibility: Upper Trapezius Stretch   Gently grasp right side of head while reaching behind back with other hand. Tilt head away until a gentle stretch is felt. Hold 30 seconds. Repeat 3 times per set. Do 2 sessions per day.  http://orth.exer.us/340   Levator Stretch   Grasp seat or sit on hand on side to be stretched. Turn head toward other side and look down. Use hand on head to gently stretch neck in that position. Hold _30___ seconds. Repeat on other side. Repeat 3 times. Do 2 sessions per day.  http://gt2.exer.us/30   Scapular Retraction (Standing)   With arms at sides, pinch shoulder blades together. Repeat 10 times per set. Do 1-3 sets per session. Do 2 sessions per day.  http://orth.exer.us/944     Flexibility: Neck Retraction   Pull head straight back, keeping eyes and jaw level. Hold 3-5 seconds. Repeat _10 times per set. Do 3-5  sessions per day.  http://orth.exer.us/344   Posture - Sitting   Sit upright, head facing forward. Try using a roll to support lower back. Keep shoulders relaxed, and avoid rounded back. Keep hips level with knees. Avoid crossing legs for long periods.   Flexibility: Corner Stretch   Standing in corner or a doorway with hands just above shoulder level.  Lean forward until a comfortable stretch is felt across chest. Hold __30__ seconds. Repeat __3__ times per set.  Do _2___ sessions per day.  http://orth.exer.us/342   Copyright  VHI. All rights reserved.   Trigger Point Dry Needling  . What is Trigger Point Dry Needling (DN)? o DN is a physical therapy technique used to treat muscle pain and dysfunction. Specifically, DN helps deactivate muscle trigger points (muscle knots).  o A thin filiform needle is used to penetrate the skin and stimulate the underlying trigger point. The goal is for a local twitch response (LTR) to occur and for the trigger point to relax. No medication of any kind is injected during the procedure.    . What Does Trigger Point Dry Needling Feel Like?  o The procedure feels different for each individual patient. Some patients report that they do not actually feel the needle enter the skin and overall the process is not painful. Very mild bleeding may occur. However, many patients feel a deep cramping in the muscle in which the needle was inserted. This is the local twitch response.   Marland Kitchen How Will I feel after the treatment? o Soreness is normal, and the onset of soreness may not occur for a few hours. Typically this soreness does not last longer than two days.  o Bruising is uncommon, however; ice can be used to decrease any possible bruising.  o In rare cases feeling tired or nauseous after the treatment is normal. In addition, your symptoms may get worse before they get better, this period will typically not last longer than 24 hours.   . What Can I do After My Treatment? o Increase your hydration by drinking more water for the next 24 hours. o You may place ice or heat on the areas treated that have become sore, however, do not use heat on inflamed or bruised areas. Heat often brings more relief post needling. o You can continue your regular activities, but vigorous activity is not recommended initially after the treatment for 24 hours. o DN is best combined with other physical therapy such as strengthening, stretching, and other therapies.    Precautions:  In some cases, dry needling is done over the lung  field. While rare, there is a risk of pneumothorax (punctured lung). Because of this, if you ever experience shortness of breath on exertion, difficulty taking a deep breath, chest pain or a dry cough following dry needling, you should report to an emergency room and tell them that you have been dry needled over the thorax.   Solon Palm, PT 10/27/17 8:36 AM [facility] [address] [place], [place], [address] Phone:  [phone]   Fax:  [phone]

## 2017-10-27 NOTE — Therapy (Signed)
[facility] [address] [place], [place], [address] Phone: [phone]   Fax:  [phone]  Physical Therapy Evaluation  Patient Details  Name: Anna Torres MRN: [id number] Date of Birth: 1970-03-21 Referring Provider (PT): Donalee Citrin   Encounter Date: 10/27/2017  PT End of Session - 10/27/17 0802    Visit Number  1    Number of Visits  12    Date for PT Re-Evaluation  12/08/17    PT Start Time  0802    PT Stop Time  0856    PT Time Calculation (min)  54 min    Activity Tolerance  Patient tolerated treatment well    Behavior During Therapy  [facility] for tasks assessed/performed       Past Medical History:  Diagnosis Date  . Asthma   . Asthma    last episode january 2015  . CIN I (cervical intraepithelial neoplasia I)   . Environmental and seasonal allergies   . GERD (gastroesophageal reflux disease)   . Headache   . Herpes genitalis in women   . Ureteral reflux 1975  . Vitamin D deficiency     Past Surgical History:  Procedure Laterality Date  . 24 HOUR PH STUDY N/A 09/18/2015   Procedure: 24 HOUR PH STUDY;  Surgeon: Willis Modena, MD;  [facility]: [facility];  Service: Endoscopy;  Laterality: N/A;  . ANTERIOR CERVICAL DECOMP/DISCECTOMY FUSION N/A 01/31/2014   Procedure: ANTERIOR CERVICAL DECOMPRESSION/DISCECTOMY FUSION CERVICAL FIVE-SIX,CERVICAL SIX-SEVEN WITH HARDWARE REMOVAL OF CODMAN PLATE AT CERVIAL FOUR-FIVE;  Surgeon: Mariam Dollar, MD;  Location: [facility] OR;  Service: Neurosurgery;  Laterality: N/A;  . BACK SURGERY Bilateral 2001   C4-C5 DISK "fusion"  . CERVICAL SPINE SURGERY    . DILATATION & CURETTAGE/HYSTEROSCOPY WITH TRUECLEAR     3'14-endometrial polyp-Womens  . DILATION AND CURETTAGE OF UTERUS    . ESOPHAGEAL MANOMETRY N/A 05/24/2013   Procedure: ESOPHAGEAL MANOMETRY (EM);  Surgeon: Willis Modena, MD;  Location: [facility];  Service: Endoscopy;  Laterality: N/A;  . ESOPHAGOGASTRODUODENOSCOPY     . ESOPHAGOGASTRODUODENOSCOPY (EGD) WITH PROPOFOL N/A 05/24/2013   Procedure: ESOPHAGOGASTRODUODENOSCOPY (EGD) WITH PROPOFOL;  Surgeon: Willis Modena, MD;  [facility]: [facility];  Service: Endoscopy;  Laterality: N/A;  . EXTERNAL EAR SURGERY     bilateral "pinning" age 34  . MANDIBLE FRACTURE SURGERY  1990   retained hardware "wires"  . MANDIBLE SURGERY      There were no vitals filed for this visit.   Subjective Assessment - 10/27/17 0807    Subjective  Patient has long h/o of neck pain including 2 surgeries (fusions C4-7). She c/o of intermittent neck pain now that has been increasing in the past few months. Paiin is at base of her skull and along either side of neck R>L. She also reports pressure HA at base of skull and at temples.    Pertinent History  asthma, cervical fusion 2001 C4/5; C5-7 fusion 2016, mandible surgery 1990    Diagnostic tests  C6/7 not fully fused per pt as of 10/18    Patient Stated Goals  decrease pain and increase movement    Currently in Pain?  Yes    Pain Score  2    gets to 10/10   Pain Location  Neck    Pain Orientation  Posterior    Pain Descriptors / Indicators  Pressure;Squeezing    Pain Type  Chronic pain    Pain Onset  More than a  month ago    Pain Frequency  Intermittent    Pain Relieving Factors  turning, lying on right side    Effect of Pain on Daily Activities  can be very limited         [facility] PT Assessment - 10/27/17 0001      Assessment   Medical Diagnosis  cervicalgia    Referring Provider (PT)  Donalee Citrin    Onset Date/Surgical Date  09/01/17    Hand Dominance  Left    Next MD Visit  11/13/17    Prior Therapy  yes      Precautions   Precaution Comments  cervical fusion      Balance Screen   Has the patient fallen in the past 6 months  No    Has the patient had a decrease in activity level because of a fear of falling?   No    Is the patient reluctant to leave their home because of a fear of falling?   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    Leisure         Observation/Other Assessments   Focus on Therapeutic Outcomes (FOTO)   56% limited      Posture/Postural Control   Posture/Postural Control  Postural limitations    Postural Limitations  Decreased thoracic kyphosis      ROM / Strength   AROM / PROM / Strength  AROM;Strength      AROM   AROM Assessment Site  Cervical    Cervical Flexion  33    Cervical Extension  --   full   Cervical - Right Side Bend  15    Cervical - Left Side Bend  9    Cervical - Right Rotation  54    Cervical - Left Rotation  51      Strength   Overall Strength Comments  capital flex WNL, flex 4/5, SB 5/5, ext 4+/5      Palpation   Palpation comment  increased tone Bil UT, tender in UT, suboccipitals, mild right thoracic paraspinasl                Objective measurements completed on examination: See above findings.              PT Education - 10/27/17 0836    Education Details  HEP    Person(s) Educated  Patient    Methods  Explanation;Demonstration;Handout    Comprehension  Verbalized understanding;Returned demonstration       PT Short Term Goals - 10/27/17 0843      PT SHORT TERM GOAL #1   Title  Ind with initial HEP    Time  2    Period  Weeks    Status  New    Target Date  11/10/17      PT SHORT TERM GOAL #2   Title  decreased pain in the neck with ADLs by 50%    Time  3    Period  Weeks    Target Date  11/17/17        PT Long Term Goals - 10/27/17 0850      PT LONG TERM GOAL #1   Title  Able to perform ADLS with 75% less pain overall in the neck.  Time  6    Period  Weeks    Status  New    Target Date  12/08/17      PT LONG TERM GOAL #2   Title  able to sleep 5 hours without waking from pain.    Time  6    Period  Weeks    Status  New      PT LONG TERM  GOAL #3   Title  Improved cervical strength to 5/5    Time  6    Period  Weeks    Status  New             Plan - 10/27/17 1238    Clinical Impression Statement  Patient has long h/o of neck pain including 2 surgeries (fusions C4-7). She c/o of intermittent neck pain now that has been increasing in the past few months. Pain is at base of her skull and along either side of neck R>L. She also reports pressure HA at base of skull and at temples. She has some decreased ROM and has weak cervical stabilizers. She is very limited with ADLS and sleep when pain hits.    History and Personal Factors relevant to plan of care:  asthma, cervical fusion 2001 C4/5; C5-7 fusion 2016, mandible surgery 1990    Clinical Presentation  Stable    Clinical Decision Making  Low    Rehab Potential  Good    Clinical Impairments Affecting Rehab Potential  cervical fusion    PT Frequency  2x / week    PT Duration  6 weeks    PT Treatment/Interventions  ADLs/Self Care Home Management;Cryotherapy;Electrical Stimulation;Moist Heat;Ultrasound;Therapeutic exercise;Neuromuscular re-education;Patient/family education;Manual techniques;Dry needling;Taping    PT Next Visit Plan  cervical stab and strengthening; manual to neck/suboccipitals; (DN as last resort)    PT Home Exercise Plan  cervical stretches, pec stretches, scap and neck retraction    Consulted and Agree with Plan of Care  Patient       Patient will benefit from skilled therapeutic intervention in order to improve the following deficits and impairments:  Decreased strength, Pain, Increased muscle spasms, Decreased range of motion, Impaired flexibility, Postural dysfunction  Visit Diagnosis: Cervicalgia - Plan: PT plan of care cert/re-cert     Problem List Patient Active Problem List   Diagnosis Date Noted  . Laryngitis 07/09/2017  . Contraception management 05/29/2016  . Adjustment disorder 05/29/2016  . 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 Hypertriglyceridemia 12/25/2015  . Well woman exam 12/11/2015  . Breakthrough bleeding on OCPs 02/10/2015  . Chronic headaches 05/31/2014  . Asthma, chronic 04/26/2014  . 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 Endometrial polyp 03/30/2012  . Menorrhagia 03/23/2012  . CIN I (cervical intraepithelial neoplasia I) 03/23/2012    Anamarie Hunn PT 10/27/2017, 12:46 PM  [facility] [address] [place], [place], [address] Phone: [phone]   Fax:  [phone]  Name: Anna Torres MRN: [id number] Date of Birth: April 21, 1970

## 2017-10-29 ENCOUNTER — Encounter: Payer: Self-pay | Admitting: Physical Therapy

## 2017-10-29 ENCOUNTER — Ambulatory Visit: Payer: BLUE CROSS/BLUE SHIELD | Admitting: Physical Therapy

## 2017-10-29 ENCOUNTER — Telehealth: Payer: Self-pay | Admitting: Allergy and Immunology

## 2017-10-29 DIAGNOSIS — M542 Cervicalgia: Secondary | ICD-10-CM

## 2017-10-29 NOTE — Telephone Encounter (Signed)
Patient called back and explained that she had suffered from a cold last week, and though she feels better she is experiencing post nasal drainage which she states is not normal for her. She is wondering if her symptoms may now be allergy related. She is currently taking her prescribed medications as instructed and is using Rhinocort as her nasal spray but states that it doesn't seem to be helping much. Will you please advise?

## 2017-10-29 NOTE — Telephone Encounter (Signed)
Patient is calling about allergies Thought her symptoms were related to a cold she is getting over, but now feels it is more allergy related Is there any thing she can take OTC or can something be called in?/  Please call patient

## 2017-10-29 NOTE — Therapy (Signed)
[facility] [place] [address] [place], [place], [address] Phone: [phone]   Fax:  [phone]  Physical Therapy Treatment  Patient Details  Name: Anna Torres MRN: [id number] Date of Birth: 04/26/1970 Referring Provider (PT): Donalee Citrin   Encounter Date: 10/29/2017  PT End of Session - 10/29/17 0837    Visit Number  2    Date for PT Re-Evaluation  12/08/17    PT Start Time  0800    PT Stop Time  0845    PT Time Calculation (min)  45 min    Activity Tolerance  Patient tolerated treatment well    Behavior During Therapy  [facility] for tasks assessed/performed       Past Medical History:  Diagnosis Date  . Asthma   . Asthma    last episode january 2015  . CIN I (cervical intraepithelial neoplasia I)   . Environmental and seasonal allergies   . GERD (gastroesophageal reflux disease)   . Headache   . Herpes genitalis in women   . Ureteral reflux 1975  . Vitamin D deficiency     Past Surgical History:  Procedure Laterality Date  . 24 HOUR PH STUDY N/A 09/18/2015   Procedure: 24 HOUR PH STUDY;  Surgeon: Willis Modena, MD;  Location: [facility];  Service: Endoscopy;  Laterality: N/A;  . ANTERIOR CERVICAL DECOMP/DISCECTOMY FUSION N/A 01/31/2014   Procedure: ANTERIOR CERVICAL DECOMPRESSION/DISCECTOMY FUSION CERVICAL FIVE-SIX,CERVICAL SIX-SEVEN WITH HARDWARE REMOVAL OF CODMAN PLATE AT CERVIAL FOUR-FIVE;  Surgeon: Mariam Dollar, MD;  Location: [facility] OR;  Service: Neurosurgery;  Laterality: N/A;  . BACK SURGERY Bilateral 2001   C4-C5 DISK "fusion"  . CERVICAL SPINE SURGERY    . DILATATION & CURETTAGE/HYSTEROSCOPY WITH TRUECLEAR     3'14-endometrial polyp-Womens  . DILATION AND CURETTAGE OF UTERUS    . ESOPHAGEAL MANOMETRY N/A 05/24/2013   Procedure: ESOPHAGEAL MANOMETRY (EM);  Surgeon: Willis Modena, MD;  [facility];  Service: Endoscopy;  Laterality: N/A;  . ESOPHAGOGASTRODUODENOSCOPY    .  ESOPHAGOGASTRODUODENOSCOPY (EGD) WITH PROPOFOL N/A 05/24/2013   Procedure: ESOPHAGOGASTRODUODENOSCOPY (EGD) WITH PROPOFOL;  Surgeon: Willis Modena, MD;  [facility]: [facility];  Service: Endoscopy;  Laterality: N/A;  . EXTERNAL EAR SURGERY     bilateral "pinning" age 53  . MANDIBLE FRACTURE SURGERY  1990   retained hardware "wires"  . MANDIBLE SURGERY      There were no vitals filed for this visit.  Subjective Assessment - 10/29/17 0804    Subjective  Woke up this morning a little stiff, but after moving around in the morning it has loosened up    Currently in Pain?  Yes    Pain Score  2     Pain Location  Neck                       [facility] Adult PT Treatment/Exercise - 10/29/17 0001      Exercises   Exercises  Neck      Neck Exercises: Machines for Strengthening   UBE (Upper Arm Bike)  L2 each way      Neck Exercises: Theraband   Shoulder Extension  Red;20 reps    Rows  Red;20 reps    Shoulder Internal Rotation  20 reps;Red      Modalities   Modalities  Moist Heat      Moist Heat Therapy   Number Minutes Moist Heat  10 Minutes    Moist Heat  Location  Cervical      Manual Therapy   Manual Therapy  Soft tissue mobilization;Passive ROM;Manual Traction    Soft tissue mobilization  posterior cervical parspinales    Passive ROM  cervical spine all directions    Manual Traction  cervical spine 5X20''               PT Short Term Goals - 10/29/17 0841      PT SHORT TERM GOAL #1   Title  Ind with initial HEP    Status  Achieved        PT Long Term Goals - 10/27/17 0850      PT LONG TERM GOAL #1   Title  Able to perform ADLS with 75% less pain overall in the neck.    Time  6    Period  Weeks    Status  New    Target Date  12/08/17      PT LONG TERM GOAL #2   Title  able to sleep 5 hours without waking from pain.    Time  6    Period  Weeks    Status  New      PT LONG TERM GOAL #3   Title  Improved cervical strength to 5/5    Time   6    Period  Weeks    Status  New            Plan - 10/29/17 9485    Clinical Impression Statement  Pt tolerated an initial progression to TE well, postural cues needed during standing exercises. Reports no pain throughout treatment only some muscle fatigue. Positive response to MT. Some resistance with rotation but no reports of pain or discomfort. Stated cervical discomfort later in the days after last treatment, that she thinks may came have come form E-Stim so MHP only.    Rehab Potential  Good    Clinical Impairments Affecting Rehab Potential  cervical fusion    PT Frequency  2x / week    PT Duration  6 weeks    PT Treatment/Interventions  ADLs/Self Care Home Management;Cryotherapy;Electrical Stimulation;Moist Heat;Ultrasound;Therapeutic exercise;Neuromuscular re-education;Patient/family education;Manual techniques;Dry needling;Taping    PT Next Visit Plan  cervical stab and strengthening; manual to neck/suboccipitals; (DN as last resort)       Patient will benefit from skilled therapeutic intervention in order to improve the following deficits and impairments:  Decreased strength, Pain, Increased muscle spasms, Decreased range of motion, Impaired flexibility, Postural dysfunction  Visit Diagnosis: Cervicalgia     Problem List Patient Active Problem List   Diagnosis Date Noted  . Laryngitis 07/09/2017  . Contraception management 05/29/2016  . Adjustment disorder 05/29/2016  . 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 Hypertriglyceridemia 12/25/2015  . Well woman exam 12/11/2015  . Breakthrough bleeding on OCPs 02/10/2015  . Chronic headaches 05/31/2014  . Asthma, chronic 04/26/2014  . 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 Endometrial polyp 03/30/2012  . Menorrhagia 03/23/2012  . CIN I (cervical intraepithelial neoplasia I) 03/23/2012    Grayce Sessions, PTA 10/29/2017, 8:41 AM  [facility] [place] [address] [place], [place], [address] Phone: [phone]   Fax:  [phone]  Name: Anna Torres MRN: [id number] Date of Birth: 12-22-1970

## 2017-10-29 NOTE — Telephone Encounter (Signed)
Called patient and left a detailed voicemail stating everything that was relayed by Dr. Lucie Leather. Patient wanted a detailed message to be left if she did not answer and gave verbal permission to do so. Advised that if she had any questions or did not feel better after 10 days to please call us.

## 2017-10-29 NOTE — Telephone Encounter (Signed)
Please inform patient that this is probably an issue tied up with her recent infection which may take up to 21 days to completely resolve.  She can use nasal saline and she can use OTC Mucinex DM but is probably most important to understand that it may take more time.  I suspect in 10 days or so she should be much better and she can keep in contact with [place] noting her response.

## 2017-10-29 NOTE — Telephone Encounter (Signed)
Called patient and left a voicemail asking for a call back to discuss.

## 2017-11-03 ENCOUNTER — Other Ambulatory Visit: Payer: Self-pay | Admitting: Family Medicine

## 2017-11-04 ENCOUNTER — Ambulatory Visit: Payer: BLUE CROSS/BLUE SHIELD | Admitting: Physical Therapy

## 2017-11-04 DIAGNOSIS — M542 Cervicalgia: Secondary | ICD-10-CM | POA: Diagnosis not present

## 2017-11-04 NOTE — Patient Instructions (Signed)
Strengthening: Chest Pull - Resisted   With resistive band looped around each hand, and arms straight out in front, stretch band across chest. Repeat __10__ times per set. Do 1-3____ sets per session. Do _1___ sessions per day.  http://orth.exer.us/926   Copyright  VHI. All rights reserved.   Resistive Band Rowing   With resistive band anchored in door, grasp both ends. Keeping elbows bent, pull back, squeezing shoulder blades together. Hold _3-5___ seconds. Repeat _10-30___ times. Do ____ sessions per day. 1 http://gt2.exer.us/98   Copyright  VHI. All rights reserved.   Strengthening: Resisted Extension   Hold tubing with both hands, arms forward. Pull arms back, elbow straight. Repeat _10-30___ times per set. Do ____ sets per session. Do _1___ sessions per day.   ALSO DO CHIN TUCK, LYING ON YOUR STOMACH. HEAD OFF OF BED. KEEPING SPINE IN NEUTRAL. 10 REPS, 1-3 SETS. Bobbye Morton, PT 11/04/17 8:44 AM' [facility]- [address] [place] [address] [place], [place], [address] Phone: [phone]   Fax:  [phone]

## 2017-11-04 NOTE — Therapy (Addendum)
[facility] [place] [address] [place], [place], [address] Phone: [phone]   Fax:  [phone]  Physical Therapy Treatment  Patient Details  Name: Anna Torres MRN: [id number] Date of Birth: [birth year] Referring Provider (PT): Donalee Citrin   Encounter Date: 11/04/2017  PT End of Session - 11/04/17 0759    Visit Number  3    Number of Visits  12    Date for PT Re-Evaluation  12/08/17    PT Start Time  0800    PT Stop Time  0856    PT Time Calculation (min)  56 min    Activity Tolerance  Patient tolerated treatment well    Behavior During Therapy  [facility] for tasks assessed/performed       Past Medical History:  Diagnosis Date  . Asthma   . Asthma    last episode january 2015  . CIN I (cervical intraepithelial neoplasia I)   . Environmental and seasonal allergies   . GERD (gastroesophageal reflux disease)   . Headache   . Herpes genitalis in women   . Ureteral reflux 1975  . Vitamin D deficiency     Past Surgical History:  Procedure Laterality Date  . 24 HOUR PH STUDY N/A 09/18/2015   Procedure: 24 HOUR PH STUDY;  Surgeon: Willis Modena, MD;  [facility];  Service: Endoscopy;  Laterality: N/A;  . ANTERIOR CERVICAL DECOMP/DISCECTOMY FUSION N/A 01/31/2014   Procedure: ANTERIOR CERVICAL DECOMPRESSION/DISCECTOMY FUSION CERVICAL FIVE-SIX,CERVICAL SIX-SEVEN WITH HARDWARE REMOVAL OF CODMAN PLATE AT CERVIAL FOUR-FIVE;  Surgeon: Mariam Dollar, MD;  Location: [facility] OR;  Service: Neurosurgery;  Laterality: N/A;  . BACK SURGERY Bilateral 2001   C4-C5 DISK "fusion"  . CERVICAL SPINE SURGERY    . DILATATION & CURETTAGE/HYSTEROSCOPY WITH TRUECLEAR     3'14-endometrial polyp-Womens  . DILATION AND CURETTAGE OF UTERUS    . ESOPHAGEAL MANOMETRY N/A 05/24/2013   Procedure: ESOPHAGEAL MANOMETRY (EM);  Surgeon: Willis Modena, MD;  [facility];  Service: Endoscopy;  Laterality: N/A;  . ESOPHAGOGASTRODUODENOSCOPY     . ESOPHAGOGASTRODUODENOSCOPY (EGD) WITH PROPOFOL N/A 05/24/2013   Procedure: ESOPHAGOGASTRODUODENOSCOPY (EGD) WITH PROPOFOL;  Surgeon: Willis Modena, MD;  [facility];  Service: Endoscopy;  Laterality: N/A;  . EXTERNAL EAR SURGERY     bilateral "pinning" age 73  . MANDIBLE FRACTURE SURGERY  1990   retained hardware "wires"  . MANDIBLE SURGERY      There were no vitals filed for this visit.  Subjective Assessment - 11/04/17 0800    Subjective  Patient reports no pain. Just a little stiff.    Pertinent History  asthma, cervical fusion 2001 C4/5; C5-7 fusion 2016, mandible surgery 1990    Diagnostic tests  C6/7 not fully fused per pt as of 10/18    Patient Stated Goals  decrease pain and increase movement    Currently in Pain?  No/denies         [facility] PT Assessment - 11/04/17 0001      ROM / Strength   AROM / PROM / Strength  Strength      Strength   Overall Strength Comments  cervical flex 4+/5, ext 5/5                   [facility] Adult PT Treatment/Exercise - 11/04/17 0001      Exercises   Exercises  Neck      Neck Exercises: Theraband   Shoulder Extension  Red;20 reps;10  reps    Rows  Red;20 reps;10 reps    Shoulder External Rotation  Red;20 reps;10 reps    Horizontal ABduction  Red;20 reps      Neck Exercises: Prone   Neck Retraction  20 reps      Manual therapy to cpine, UT and suboccipitals followed by IFC estim/heat to cspine.       PT Education - 11/04/17 0933    Education Details  HEP    Person(s) Educated  Patient    Methods  Explanation;Demonstration;Handout    Comprehension  Verbalized understanding;Returned demonstration       PT Short Term Goals - 10/29/17 0841      PT SHORT TERM GOAL #1   Title  Ind with initial HEP    Status  Achieved        PT Long Term Goals - 11/04/17 0933      PT LONG TERM GOAL #3   Title  Improved cervical strength to 5/5    Time  6    Period  Weeks    Status  On-going             Plan - 11/04/17 0933    Clinical Impression Statement  Pt continues to improve reporting only stiffness and not pain today. She also reports only experiencing one "light" HA in past week. Strength is progressing.    Rehab Potential  Good    Clinical Impairments Affecting Rehab Potential  cervical fusion    PT Frequency  2x / week    PT Duration  6 weeks    PT Treatment/Interventions  ADLs/Self Care Home Management;Cryotherapy;Electrical Stimulation;Moist Heat;Ultrasound;Therapeutic exercise;Neuromuscular re-education;Patient/family education;Manual techniques;Dry needling;Taping    PT Next Visit Plan  cervical stab and strengthening; manual to neck/suboccipitals; (DN as last resort)    PT Home Exercise Plan  cervical stretches, pec stretches, scap and neck retraction; scapular unattached, rows and extension    Consulted and Agree with Plan of Care  Patient       Patient will benefit from skilled therapeutic intervention in order to improve the following deficits and impairments:  Decreased strength, Pain, Increased muscle spasms, Decreased range of motion, Impaired flexibility, Postural dysfunction  Visit Diagnosis: Cervicalgia     Problem List Patient Active Problem List   Diagnosis Date Noted  . Laryngitis 07/09/2017  . Contraception management 05/29/2016  . Adjustment disorder 05/29/2016  . 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 Hypertriglyceridemia 12/25/2015  . Well woman exam 12/11/2015  . Breakthrough bleeding on OCPs 02/10/2015  . Chronic headaches 05/31/2014  . Asthma, chronic 04/26/2014  . 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 Endometrial polyp 03/30/2012  . Menorrhagia 03/23/2012  . CIN I (cervical intraepithelial neoplasia I) 03/23/2012    Isais Klipfel PT 11/04/2017, 9:36 AM  [facility] [place] [address] [place], [place],  [address] Phone: [phone]   Fax:  [phone]  Name: Anna Torres MRN: [id number] Date of Birth: May 09, 1970

## 2017-11-06 ENCOUNTER — Ambulatory Visit: Payer: BLUE CROSS/BLUE SHIELD | Admitting: Physical Therapy

## 2017-11-06 DIAGNOSIS — M542 Cervicalgia: Secondary | ICD-10-CM | POA: Diagnosis not present

## 2017-11-06 NOTE — Therapy (Signed)
[place] [place] [address] [place], [place], [address] Phone: [phone]   Fax:  [phone]  Physical Therapy Treatment  Patient Details  Name: Anna Torres MRN: [id number] Date of Birth: [birth year] Referring Provider (PT): Kary Kos   Encounter Date: 11/06/2017  PT End of Session - 11/06/17 0832    Visit Number  4    Number of Visits  12    Date for PT Re-Evaluation  12/08/17    PT Start Time  0755    PT Stop Time  0830    PT Time Calculation (min)  35 min       Past Medical History:  Diagnosis Date  . Asthma   . Asthma    last episode january 2015  . CIN I (cervical intraepithelial neoplasia I)   . Environmental and seasonal allergies   . GERD (gastroesophageal reflux disease)   . Headache   . Herpes genitalis in women   . Ureteral reflux 1975  . Vitamin D deficiency     Past Surgical History:  Procedure Laterality Date  . [place] STUDY N/A 09/18/2015   Procedure: [place] STUDY;  Surgeon: Arta Silence, MD;  [facility];  Service: Endoscopy;  Laterality: N/A;  . ANTERIOR CERVICAL DECOMP/DISCECTOMY FUSION N/A 01/31/2014   Procedure: ANTERIOR CERVICAL DECOMPRESSION/DISCECTOMY FUSION CERVICAL FIVE-SIX,CERVICAL SIX-SEVEN WITH HARDWARE REMOVAL OF CODMAN PLATE AT CERVIAL FOUR-FIVE;  Surgeon: Elaina Hoops, MD;  Location: [place];  Service: Neurosurgery;  Laterality: N/A;  . BACK SURGERY Bilateral 2001   C4-C5 DISK "fusion"  . CERVICAL SPINE SURGERY    . DILATATION & CURETTAGE/HYSTEROSCOPY WITH TRUECLEAR     3'14-endometrial polyp-Womens  . DILATION AND CURETTAGE OF UTERUS    . ESOPHAGEAL MANOMETRY N/A 05/24/2013   Procedure: ESOPHAGEAL MANOMETRY (EM);  Surgeon: Arta Silence, MD;  [facility];  Service: Endoscopy;  Laterality: N/A;  . ESOPHAGOGASTRODUODENOSCOPY    . ESOPHAGOGASTRODUODENOSCOPY (EGD) WITH PROPOFOL N/A 05/24/2013   Procedure: ESOPHAGOGASTRODUODENOSCOPY (EGD) WITH  PROPOFOL;  Surgeon: Arta Silence, MD;  [facility];  Service: Endoscopy;  Laterality: N/A;  . EXTERNAL EAR SURGERY     bilateral "pinning" age 47  . [place]   retained hardware "wires"  . MANDIBLE SURGERY      There were no vitals filed for this visit.  Subjective Assessment - 11/06/17 0757    Subjective  doing HEP without trouble. no pain just some stiffness, more of LEft    Currently in Pain?  No/denies                       [facility] Adult PT Treatment/Exercise - 11/06/17 0001      Exercises   Exercises  Neck      Neck Exercises: Machines for Strengthening   UBE (Upper Arm Bike)  L3 53mn each way    Cybex Row  20# 2 sets 10    Lat Pull  15# 2 sets 10      Neck Exercises: Standing   Neck Retraction  15 reps;3 secs   head on ball   Wall Push Ups  10 reps   with ball on wall   Other Standing Exercises  shruggs and backward rolls 15 each 4#    Other Standing Exercises  5# pulleys 15 times retraction and ext      Manual Therapy   Manual Therapy  Soft tissue mobilization;Passive ROM;Manual Traction  Soft tissue mobilization  left trap and cspine    Passive ROM  cervical spine all directions             PT Education - 11/06/17 0831    Education Details  HEP 3# shruggs and rolls, plus ball vs wall and cerv retraction    Person(s) Educated  Patient    Methods  Explanation;Demonstration;Handout    Comprehension  Verbalized understanding       PT Short Term Goals - 11/06/17 2010      PT SHORT TERM GOAL #1   Title  Ind with initial HEP    Status  Achieved      PT SHORT TERM GOAL #2   Title  decreased pain in the neck with ADLs by 50%    Status  Achieved        PT Long Term Goals - 11/06/17 0813      PT LONG TERM GOAL #1   Title  Able to perform ADLS with 07% less pain overall in the neck.    Status  Partially Met      PT LONG TERM GOAL #2   Title  able to sleep 5 hours without waking from pain.     Status  Achieved      PT LONG TERM GOAL #3   Title  Improved cervical strength to 5/5    Status  On-going            Plan - 11/06/17 0832    Clinical Impression Statement  STGs met. Progressing with LTGs. No pain an dtolerated ther ex progression well and issued add'l postural strengthening- some cuing needed    PT Treatment/Interventions  ADLs/Self Care Home Management;Cryotherapy;Electrical Stimulation;Moist Heat;Ultrasound;Therapeutic exercise;Neuromuscular re-education;Patient/family education;Manual techniques;Dry needling;Taping    PT Next Visit Plan  cervical stab and strengthening; 2 appts next week tehn sees Neuro MD. progress to D/C with HEP       Patient will benefit from skilled therapeutic intervention in order to improve the following deficits and impairments:  Decreased strength, Pain, Increased muscle spasms, Decreased range of motion, Impaired flexibility, Postural dysfunction  Visit Diagnosis: Cervicalgia     Problem List Patient Active Problem List   Diagnosis Date Noted  . Laryngitis 07/09/2017  . Contraception management 05/29/2016  . Adjustment disorder 05/29/2016  . 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 Hypertriglyceridemia 12/25/2015  . Well woman exam 12/11/2015  . Breakthrough bleeding on OCPs 02/10/2015  . Chronic headaches 05/31/2014  . Asthma, chronic 04/26/2014  . 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 Endometrial polyp 03/30/2012  . Menorrhagia 03/23/2012  . CIN I (cervical intraepithelial neoplasia I) 03/23/2012    PAYSEUR,ANGIE PTA 11/06/2017, 8:35 AM  [place] [address] [place], [place], [address] Phone: [phone]   Fax:  [phone]  Name: Anna Torres MRN: [id number] Date of Birth: 1970-06-07

## 2017-11-07 ENCOUNTER — Other Ambulatory Visit: Payer: Self-pay

## 2017-11-07 ENCOUNTER — Other Ambulatory Visit: Payer: Self-pay | Admitting: Nurse Practitioner

## 2017-11-07 MED ORDER — BUDESONIDE-FORMOTEROL FUMARATE 160-4.5 MCG/ACT IN AERO: 2.0000 | INHALATION_SPRAY | Freq: Two times a day (BID) | RESPIRATORY_TRACT | 3 refills | Status: DC

## 2017-11-10 ENCOUNTER — Ambulatory Visit: Payer: BLUE CROSS/BLUE SHIELD | Admitting: Physical Therapy

## 2017-11-10 DIAGNOSIS — M542 Cervicalgia: Secondary | ICD-10-CM | POA: Diagnosis not present

## 2017-11-10 NOTE — Therapy (Signed)
[address] [place], [place], [address] Phone: [phone]   Fax:  [phone]  Physical Therapy Treatment  Patient Details  Name: Anna Torres MRN: [id number] Date of Birth: 06-03-1970 Referring Provider (PT): Kary Kos   Encounter Date: 11/10/2017  PT End of Session - 11/10/17 0803    Visit Number  5    Number of Visits  12    Date for PT Re-Evaluation  12/08/17    PT Start Time  0801    PT Stop Time  0844    PT Time Calculation (min)  43 min    Activity Tolerance  Patient tolerated treatment well    Behavior During Therapy  [facility] for tasks assessed/performed       Past Medical History:  Diagnosis Date  . Asthma   . Asthma    last episode january 2015  . CIN I (cervical intraepithelial neoplasia I)   . Environmental and seasonal allergies   . GERD (gastroesophageal reflux disease)   . Headache   . Herpes genitalis in women   . Ureteral reflux 1975  . Vitamin D deficiency     Past Surgical History:  Procedure Laterality Date  . [place] STUDY N/A 09/18/2015   Procedure: [place] STUDY;  Surgeon: Arta Silence, MD;  [facility];  Service: Endoscopy;  Laterality: N/A;  . ANTERIOR CERVICAL DECOMP/DISCECTOMY FUSION N/A 01/31/2014   Procedure: ANTERIOR CERVICAL DECOMPRESSION/DISCECTOMY FUSION CERVICAL FIVE-SIX,CERVICAL SIX-SEVEN WITH HARDWARE REMOVAL OF CODMAN PLATE AT CERVIAL FOUR-FIVE;  Surgeon: Elaina Hoops, MD;  Location: [place];  Service: Neurosurgery;  Laterality: N/A;  . BACK SURGERY Bilateral 2001   C4-C5 DISK "fusion"  . CERVICAL SPINE SURGERY    . DILATATION & CURETTAGE/HYSTEROSCOPY WITH TRUECLEAR     3'14-endometrial polyp-Womens  . DILATION AND CURETTAGE OF UTERUS    . ESOPHAGEAL MANOMETRY N/A 05/24/2013   Procedure: ESOPHAGEAL MANOMETRY (EM);  Surgeon: Arta Silence, MD;  [facility];  Service: Endoscopy;  Laterality: N/A;  . ESOPHAGOGASTRODUODENOSCOPY     . ESOPHAGOGASTRODUODENOSCOPY (EGD) WITH PROPOFOL N/A 05/24/2013   Procedure: ESOPHAGOGASTRODUODENOSCOPY (EGD) WITH PROPOFOL;  Surgeon: Arta Silence, MD;  [facility];  Service: Endoscopy;  Laterality: N/A;  . EXTERNAL EAR SURGERY     bilateral "pinning" age 69  . [place]   retained hardware "wires"  . MANDIBLE SURGERY      There were no vitals filed for this visit.  Subjective Assessment - 11/10/17 0804    Subjective  Patient reports increased pain the day of last viist beginning at 4 pm until she went to sleep. She was fine the next day.    Pertinent History  asthma, cervical fusion 2001 C4/5; C5-7 fusion 2016, mandible surgery 1990    Diagnostic tests  C6/7 not fully fused per pt as of 10/18    Patient Stated Goals  decrease pain and increase movement    Currently in Pain?  No/denies                       [facility] Adult PT Treatment/Exercise - 11/10/17 0001      Neck Exercises: Machines for Strengthening   UBE (Upper Arm Bike)  L3 65mn each way    Cybex Row  20# 2 sets 10    Lat Pull  15# 2 sets 10      Neck Exercises: Standing   Neck Retraction  20 reps    Wall Push Ups  10 reps   with ball on wall, 5 reps at 12,3,6,9   Other Standing Exercises  shruggs and backward rolls 15 each 4#    Other Standing Exercises  5# pulleys 20 times retraction and ext      Manual Therapy   Manual Therapy  Soft tissue mobilization;Myofascial release    Soft tissue mobilization  R cervical spine    Myofascial Release  O/A release and additional MFR to R subocciptals      Neck Exercises: Stretches   Other Neck Stretches  QL stretch bil 2x60 sec               PT Short Term Goals - 11/06/17 8563      PT SHORT TERM GOAL #1   Title  Ind with initial HEP    Status  Achieved      PT SHORT TERM GOAL #2   Title  decreased pain in the neck with ADLs by 50%    Status  Achieved        PT Long Term Goals - 11/06/17 0813      PT LONG  TERM GOAL #1   Title  Able to perform ADLS with 14% less pain overall in the neck.    Status  Partially Met      PT LONG TERM GOAL #2   Title  able to sleep 5 hours without waking from pain.    Status  Achieved      PT LONG TERM GOAL #3   Title  Improved cervical strength to 5/5    Status  On-going            Plan - 11/10/17 1150    Clinical Impression Statement  Patient continues to progress toward goals. She demonstrated tightness in L QL which may be contributing to tension in L UT. QL stretch was issued. Increased tone today on the right side vs. left.  Patient needs MD note next visit.    PT Treatment/Interventions  ADLs/Self Care Home Management;Cryotherapy;Electrical Stimulation;Moist Heat;Ultrasound;Therapeutic exercise;Neuromuscular re-education;Patient/family education;Manual techniques;Dry needling;Taping    PT Next Visit Plan  MD note for appt 11/13/17. Progress HEP and assess goals for D/C.    PT Home Exercise Plan  cervical stretches, pec stretches, scap and neck retraction; scapular unattached, rows and extension       Patient will benefit from skilled therapeutic intervention in order to improve the following deficits and impairments:  Decreased strength, Pain, Increased muscle spasms, Decreased range of motion, Impaired flexibility, Postural dysfunction  Visit Diagnosis: Cervicalgia     Problem List Patient Active Problem List   Diagnosis Date Noted  . Laryngitis 07/09/2017  . Contraception management 05/29/2016  . Adjustment disorder 05/29/2016  . 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 Hypertriglyceridemia 12/25/2015  . Well woman exam 12/11/2015  . Breakthrough bleeding on OCPs 02/10/2015  . Chronic headaches 05/31/2014  . Asthma, chronic 04/26/2014  . 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 Endometrial polyp 03/30/2012  . Menorrhagia 03/23/2012  . CIN I (cervical intraepithelial neoplasia I)  03/23/2012    Radonna Bracher PT 11/10/2017, 11:53 AM  [address] [place], [place], [address] Phone: [phone]   Fax:  [phone]  Name: Tyisha Cressy MRN: [id number] Date of Birth: 1970-04-28

## 2017-11-11 ENCOUNTER — Other Ambulatory Visit: Payer: Self-pay | Admitting: Nurse Practitioner

## 2017-11-12 ENCOUNTER — Ambulatory Visit: Payer: BLUE CROSS/BLUE SHIELD | Admitting: Physical Therapy

## 2017-11-12 ENCOUNTER — Encounter: Payer: Self-pay | Admitting: Physical Therapy

## 2017-11-12 DIAGNOSIS — M542 Cervicalgia: Secondary | ICD-10-CM

## 2017-11-12 NOTE — Therapy (Signed)
[address] [place], [place], [address] Phone: [phone]   Fax:  [phone]  Physical Therapy Treatment  Patient Details  Name: Anna Torres MRN: [id number] Date of Birth: 12-08-1970 Referring Provider (PT): Kary Kos   Encounter Date: 11/12/2017  PT End of Session - 11/12/17 0848    Visit Number  6    Date for PT Re-Evaluation  12/08/17    PT Start Time  0800    PT Stop Time  0845    PT Time Calculation (min)  45 min    Activity Tolerance  Patient tolerated treatment well    Behavior During Therapy  [facility] for tasks assessed/performed       Past Medical History:  Diagnosis Date  . Asthma   . Asthma    last episode january 2015  . CIN I (cervical intraepithelial neoplasia I)   . Environmental and seasonal allergies   . GERD (gastroesophageal reflux disease)   . Headache   . Herpes genitalis in women   . Ureteral reflux 1975  . Vitamin D deficiency     Past Surgical History:  Procedure Laterality Date  . [place] STUDY N/A 09/18/2015   Procedure: [place] STUDY;  Surgeon: Arta Silence, MD;  [facility];  Service: Endoscopy;  Laterality: N/A;  . ANTERIOR CERVICAL DECOMP/DISCECTOMY FUSION N/A 01/31/2014   Procedure: ANTERIOR CERVICAL DECOMPRESSION/DISCECTOMY FUSION CERVICAL FIVE-SIX,CERVICAL SIX-SEVEN WITH HARDWARE REMOVAL OF CODMAN PLATE AT CERVIAL FOUR-FIVE;  Surgeon: Elaina Hoops, MD;  Location: [place];  Service: Neurosurgery;  Laterality: N/A;  . BACK SURGERY Bilateral 2001   C4-C5 DISK "fusion"  . CERVICAL SPINE SURGERY    . DILATATION & CURETTAGE/HYSTEROSCOPY WITH TRUECLEAR     3'14-endometrial polyp-Womens  . DILATION AND CURETTAGE OF UTERUS    . ESOPHAGEAL MANOMETRY N/A 05/24/2013   Procedure: ESOPHAGEAL MANOMETRY (EM);  Surgeon: Arta Silence, MD;  [facility];  Service: Endoscopy;  Laterality: N/A;  . ESOPHAGOGASTRODUODENOSCOPY    .  ESOPHAGOGASTRODUODENOSCOPY (EGD) WITH PROPOFOL N/A 05/24/2013   Procedure: ESOPHAGOGASTRODUODENOSCOPY (EGD) WITH PROPOFOL;  Surgeon: Arta Silence, MD;  [facility];  Service: Endoscopy;  Laterality: N/A;  . EXTERNAL EAR SURGERY     bilateral "pinning" age 50  . [place]   retained hardware "wires"  . MANDIBLE SURGERY      There were no vitals filed for this visit.  Subjective Assessment - 11/12/17 0803    Subjective  Pt reports that she is doing fine, stated that she is having some L elbow pain from RA    Currently in Pain?  Yes    Pain Location  Elbow    Pain Orientation  Left         [facility] PT Assessment - 11/12/17 0001      AROM   AROM Assessment Site  Cervical   WFL for patient.     Strength   Overall Strength Comments  cervical flex 5/5, ext 5/5                   [facility] Adult PT Treatment/Exercise - 11/12/17 0001      Exercises   Exercises  Neck      Neck Exercises: Machines for Strengthening   UBE (Upper Arm Bike)  L3 36mn each way    Cybex Row  20# 2 sets 10    Lat Pull  20lb 2x10  Neck Exercises: Standing   Neck Retraction  20 reps    Wall Push Ups  5 reps   With Pball clock CW,CCW   Other Standing Exercises  shruggs and backward rolls 15 each 4#, with levater stretch     Other Standing Exercises  Rows & Ext green 2x10       Manual Therapy   Manual Therapy  Soft tissue mobilization;Manual Traction    Soft tissue mobilization  posterior cervical parspinales    Manual Traction  cervical spine 4X15''               PT Short Term Goals - 11/06/17 3524      PT SHORT TERM GOAL #1   Title  Ind with initial HEP    Status  Achieved      PT SHORT TERM GOAL #2   Title  decreased pain in the neck with ADLs by 50%    Status  Achieved        PT Long Term Goals - 11/12/17 0850      PT LONG TERM GOAL #3   Title  Improved cervical strength to 5/5    Status  Achieved            Plan - 11/12/17  0848    Clinical Impression Statement  All goals met, no issues with today's interventions. Pt returns to MD tomorrow.     Rehab Potential  Good    Clinical Impairments Affecting Rehab Potential  cervical fusion    PT Frequency  2x / week    PT Duration  6 weeks    PT Next Visit Plan   D/C PT       Patient will benefit from skilled therapeutic intervention in order to improve the following deficits and impairments:  Decreased strength, Pain, Increased muscle spasms, Decreased range of motion, Impaired flexibility, Postural dysfunction  Visit Diagnosis: Cervicalgia     Problem List Patient Active Problem List   Diagnosis Date Noted  . Laryngitis 07/09/2017  . Contraception management 05/29/2016  . Adjustment disorder 05/29/2016  . 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 Hypertriglyceridemia 12/25/2015  . Well woman exam 12/11/2015  . Breakthrough bleeding on OCPs 02/10/2015  . Chronic headaches 05/31/2014  . Asthma, chronic 04/26/2014  . 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 Endometrial polyp 03/30/2012  . Menorrhagia 03/23/2012  . CIN I (cervical intraepithelial neoplasia I) 03/23/2012   PHYSICAL THERAPY DISCHARGE SUMMARY  Visits from Start of Care: 6 Plan: Patient agrees to discharge.  Patient goals were met. Patient is being discharged due to meeting the stated rehab goals.  ?????       Scot Jun, PTA 11/12/2017, 8:52 AM  [address] [place], [place], [address] Phone: [phone]   Fax:  [phone]  Name: Anna Torres MRN: [id number] Date of Birth: 12-26-1970

## 2017-11-13 DIAGNOSIS — M542 Cervicalgia: Secondary | ICD-10-CM | POA: Diagnosis not present

## 2017-11-13 MED ORDER — ACYCLOVIR 400 MG PO TABS: ORAL_TABLET | ORAL | 0 refills | Status: DC

## 2017-11-13 NOTE — Telephone Encounter (Signed)
Requested medication (s) are due for refill today:  yes  Requested medication (s) are on the active medication list:  Yes  Last OV: 07/09/17  Future visit scheduled:  no  Last Refill: 10/08/17; #15; no refills  Pt. Is requesting qty. of 30 tabs per refill  Requested Prescriptions  Pending Prescriptions Disp Refills   acyclovir (ZOVIRAX) 400 MG tablet 15 tablet 0     There is no refill protocol information for this order

## 2017-11-13 NOTE — Telephone Encounter (Signed)
Copied from CRM [id number]. Topic: Quick Communication - Rx Refill/Question >> Nov 13, 2017  9:30 AM Floria Raveling A wrote: Medication: acyclovir (ZOVIRAX) 400 MG tablet [[id number]] - pt would like 30 of these a month instead of 15 tabs a month.  She is not taking them everyday but would like to have the available if she has flare up   Has the patient contacted their pharmacy? No  (Agent: If no, request that the patient contact the pharmacy for the refill.) (Agent: If yes, when and what did the pharmacy advise?) Preferred Pharmacy (with phone number or street name): [id number] - [place], [address] - [address] AT [facility] [address] [phone] (Phone)   Agent: Please be advised that RX refills may take up to 3 business days. We ask that you follow-up with your pharmacy.

## 2017-12-05 ENCOUNTER — Other Ambulatory Visit: Payer: Self-pay | Admitting: Family Medicine

## 2018-01-07 ENCOUNTER — Other Ambulatory Visit: Payer: Self-pay | Admitting: Family Medicine

## 2018-01-07 ENCOUNTER — Other Ambulatory Visit: Payer: Self-pay | Admitting: Allergy & Immunology

## 2018-01-29 DIAGNOSIS — K7581 Nonalcoholic steatohepatitis (NASH): Secondary | ICD-10-CM | POA: Diagnosis not present

## 2018-01-29 DIAGNOSIS — K219 Gastro-esophageal reflux disease without esophagitis: Secondary | ICD-10-CM | POA: Diagnosis not present

## 2018-02-03 ENCOUNTER — Ambulatory Visit: Payer: BLUE CROSS/BLUE SHIELD | Admitting: Allergy and Immunology

## 2018-02-03 ENCOUNTER — Encounter: Payer: Self-pay | Admitting: Allergy and Immunology

## 2018-02-03 VITALS — BP 110/70 | HR 96 | Resp 16 | Ht 62.0 in | Wt 133.6 lb

## 2018-02-03 DIAGNOSIS — J4540 Moderate persistent asthma, uncomplicated: Secondary | ICD-10-CM

## 2018-02-03 DIAGNOSIS — J3089 Other allergic rhinitis: Secondary | ICD-10-CM

## 2018-02-03 DIAGNOSIS — K219 Gastro-esophageal reflux disease without esophagitis: Secondary | ICD-10-CM

## 2018-02-03 DIAGNOSIS — Z23 Encounter for immunization: Secondary | ICD-10-CM | POA: Diagnosis not present

## 2018-02-03 MED ORDER — MONTELUKAST SODIUM 10 MG PO TABS: 10.0000 mg | ORAL_TABLET | Freq: Every day | ORAL | 5 refills | Status: DC

## 2018-02-03 MED ORDER — DEXLANSOPRAZOLE 60 MG PO CPDR: 60.0000 mg | DELAYED_RELEASE_CAPSULE | Freq: Every day | ORAL | 5 refills | Status: DC

## 2018-02-03 MED ORDER — BUDESONIDE 32 MCG/ACT NA SUSP: 1.0000 | Freq: Every day | NASAL | 5 refills | Status: DC

## 2018-02-03 MED ORDER — BUDESONIDE-FORMOTEROL FUMARATE 160-4.5 MCG/ACT IN AERO: 2.0000 | INHALATION_SPRAY | Freq: Two times a day (BID) | RESPIRATORY_TRACT | 3 refills | Status: DC

## 2018-02-03 NOTE — Progress Notes (Signed)
Follow-up Note  Referring Provider: Dianne Dun, MD Primary Provider: Dianne Dun, MD Date of Office Visit: 02/03/2018  Subjective:   Anna Torres (DOB: 1970-10-29) is a 48 y.o. female who [facility] in re-evaluation of the following:  HPI: Anna Torres returns to this clinic in reevaluation of asthma and allergic rhinitis and reflux with LPR.  Her last visit to this clinic was 29 July 2017.  She has had a very good interval of time.  She has not required a systemic steroid or an antibiotic to treat any type of respiratory tract issue.  She is presently using Symbicort 2 inhalations mostly 1 time per day during this period of the year and rarely uses any Rhinocort at this point.  She continues on montelukast on a regular basis.  Rarely does she use a short acting bronchodilator.  She does not really exercise to any large degree.  She has had very little issues with her nose.  Her reflux is under good control as long as she continues to use Dexilant on a consistent basis.  She has not obtained the flu vaccine for this year.  Allergies as of 02/03/2018      Reactions   Codeine Nausea And Vomiting   Hydrocodone Nausea And Vomiting   Oxycodone Nausea And Vomiting   Sumatriptan Other (See Comments)   Heart palpitations,SOB, chest pressure and shakiness      Medication List      acyclovir 400 MG tablet Commonly known as:  ZOVIRAX TAKE 1 TABLET BY MOUTH EVERY OTHER DAY   budesonide-formoterol 160-4.5 MCG/ACT inhaler Commonly known as:  SYMBICORT Inhale 2 puffs into the lungs 2 (two) times daily.   dexlansoprazole 60 MG capsule Commonly known as:  DEXILANT Take 60 mg by mouth daily.   fenofibrate 48 MG tablet Commonly known as:  TRICOR TAKE 1 TABLET(48 MG) BY MOUTH DAILY   montelukast 10 MG tablet Commonly known as:  SINGULAIR TAKE 1 TABLET BY MOUTH AT BEDTIME   norgestimate-ethinyl estradiol 0.25-35 MG-MCG tablet Commonly  known as:  SPRINTEC 28 TAKE 1 TABLET BY MOUTH DAILY   RHINOCORT ALLERGY 32 MCG/ACT nasal spray Generic drug:  budesonide Place 1 spray into both nostrils daily.       Past Medical History:  Diagnosis Date  . Asthma   . Asthma    last episode january 2015  . CIN I (cervical intraepithelial neoplasia I)   . Environmental and seasonal allergies   . GERD (gastroesophageal reflux disease)   . Headache   . Herpes genitalis in women   . Ureteral reflux 1975  . Vitamin D deficiency     Past Surgical History:  Procedure Laterality Date  . 24 HOUR PH STUDY N/A 09/18/2015   Procedure: 24 HOUR PH STUDY;  Surgeon: Willis Modena, MD;  [facility];  Service: Endoscopy;  Laterality: N/A;  . ANTERIOR CERVICAL DECOMP/DISCECTOMY FUSION N/A 01/31/2014   Procedure: ANTERIOR CERVICAL DECOMPRESSION/DISCECTOMY FUSION CERVICAL FIVE-SIX,CERVICAL SIX-SEVEN WITH HARDWARE REMOVAL OF CODMAN PLATE AT CERVIAL FOUR-FIVE;  Surgeon: Mariam Dollar, MD;  Location: [facility];  Service: Neurosurgery;  Laterality: N/A;  . BACK SURGERY Bilateral 2001   C4-C5 DISK "fusion"  . CERVICAL SPINE SURGERY    . DILATATION & CURETTAGE/HYSTEROSCOPY WITH TRUECLEAR     3'14-endometrial polyp-Womens  . DILATION AND CURETTAGE OF UTERUS    . ESOPHAGEAL MANOMETRY N/A 05/24/2013   Procedure: ESOPHAGEAL MANOMETRY (EM);  Surgeon: Willis Modena,  MD;  [facility];  Service: Endoscopy;  Laterality: N/A;  . ESOPHAGOGASTRODUODENOSCOPY    . ESOPHAGOGASTRODUODENOSCOPY (EGD) WITH PROPOFOL N/A 05/24/2013   Procedure: ESOPHAGOGASTRODUODENOSCOPY (EGD) WITH PROPOFOL;  Surgeon: Willis ModenaWilliam Outlaw, MD;  [facility];  Service: Endoscopy;  Laterality: N/A;  . EXTERNAL EAR SURGERY     bilateral "pinning" age 89  . MANDIBLE FRACTURE SURGERY  1990   retained hardware "wires"  . MANDIBLE SURGERY      Review of systems negative except as noted in HPI / PMHx or noted below:  Review of Systems  Constitutional: Negative.   HENT:  Negative.   Eyes: Negative.   Respiratory: Negative.   Cardiovascular: Negative.   Gastrointestinal: Negative.   Genitourinary: Negative.   Musculoskeletal: Negative.   Skin: Negative.   Neurological: Negative.   Endo/Heme/Allergies: Negative.   Psychiatric/Behavioral: Negative.      Objective:   Vitals:   02/03/18 0944  BP: 110/70  Pulse: 96  Resp: 16  SpO2: 96%   Height: 5\' 2"  (157.5 cm)  Weight: 133 lb 9.6 oz (60.6 kg)   Physical Exam Constitutional:      Appearance: She is not diaphoretic.  HENT:     Head: Normocephalic.     Right Ear: Tympanic membrane, ear canal and external ear normal.     Left Ear: Tympanic membrane, ear canal and external ear normal.     Nose: Nose normal. No mucosal edema or rhinorrhea.     Mouth/Throat:     Pharynx: Uvula midline. No oropharyngeal exudate.  Eyes:     Conjunctiva/sclera: Conjunctivae normal.  Neck:     Thyroid: No thyromegaly.     Trachea: Trachea normal. No tracheal tenderness or tracheal deviation.  Cardiovascular:     Rate and Rhythm: Normal rate and regular rhythm.     Heart sounds: Normal heart sounds, S1 normal and S2 normal. No murmur.  Pulmonary:     Effort: No respiratory distress.     Breath sounds: Normal breath sounds. No stridor. No wheezing or rales.  Lymphadenopathy:     Head:     Right side of head: No tonsillar adenopathy.     Left side of head: No tonsillar adenopathy.     Cervical: No cervical adenopathy.  Skin:    Findings: No erythema or rash.     Nails: There is no clubbing.   Neurological:     Mental Status: She is alert.     Diagnostics:    Spirometry was performed and demonstrated an FEV1 of 1.50 at 56 % of predicted.  She had a less than optimal effort on the spirometric maneuver.  The patient had an Asthma Control Test with the following results: ACT Total Score: 23.    Assessment and Plan:   1. Asthma, moderate persistent, well-controlled   2. Other allergic rhinitis   3.  LPRD (laryngopharyngeal reflux disease)   4. Need for immunization against influenza     1. Continue Symbicort 160 - 2 inhalations 1-2 times a day depending on disease activity  2. Continue OTC Rhinocort one spray each nostril 1-7 times a week depending on disease activity  3. Continue montelukast 10 mg tablet 1 time per day  4. Continue Dexilant 60mg  in the morning    5. Continue ProAir HFA and antihistamine if needed  6. Continue action plan for asthma flare including adding high dose Qvar 80 Redihaler at 3 inhalations 3 times per day to Symbicort  7.  Flu vaccine administered in  clinic today  8. Return to clinic in 6 months or earlier if problem  Anna HansenCarey appears to be doing quite well on her current plan.  She has a very good understanding of how her medications work and the appropriate use of these medications and when to alter the dose of these medications.  She will continue on anti-inflammatory agents for her airway and therapy directed against reflux and I will see her back in this clinic in 6 months or earlier if there is a problem.  Laurette SchimkeEric Galilee Pierron, MD Allergy / Immunology [facility]

## 2018-02-03 NOTE — Patient Instructions (Signed)
  1. Continue Symbicort 160 - 2 inhalations 1-2 times a day depending on disease activity  2. Continue OTC Rhinocort one spray each nostril 1-7 times a week depending on disease activity  3. Continue montelukast 10 mg tablet 1 time per day  4. Continue Dexilant 60mg  in the morning    5. Continue ProAir HFA and antihistamine if needed  6. Continue action plan for asthma flare including adding high dose Qvar 80 Redihaler at 3 inhalations 3 times per day to Symbicort  7.  Flu vaccine administered in clinic today  8. Return to clinic in 6 months or earlier if problem

## 2018-02-04 ENCOUNTER — Encounter: Payer: Self-pay | Admitting: Allergy and Immunology

## 2018-02-04 ENCOUNTER — Other Ambulatory Visit: Payer: Self-pay | Admitting: Allergy & Immunology

## 2018-02-12 ENCOUNTER — Other Ambulatory Visit: Payer: Self-pay | Admitting: Family Medicine

## 2018-02-26 ENCOUNTER — Other Ambulatory Visit: Payer: Self-pay | Admitting: Family Medicine

## 2018-03-16 ENCOUNTER — Other Ambulatory Visit: Payer: Self-pay | Admitting: Family Medicine

## 2018-04-01 NOTE — Progress Notes (Signed)
Subjective:   Patient ID: Anna Torres, female    DOB: 22-Aug-1970, 48 y.o.   MRN: [id number]  Anna Torres is a pleasant 48 y.o. year old female who presents to clinic today with Annual Exam  on 04/02/2018  HPI:  Health Maintenance  Topic Date Due  . PAP SMEAR-Modifier  12/25/2018  . TETANUS/TDAP  12/27/2024  . INFLUENZA VACCINE  Completed  . HIV Screening  Discontinued    Computer system was down while she was here for OV.  We did order labs, including RF as her mother has RA and pt has had more joint pain lately. Current Outpatient Medications on File Prior to Visit  Medication Sig Dispense Refill  . budesonide (RHINOCORT ALLERGY) 32 MCG/ACT nasal spray Place 1 spray into both nostrils daily. 8.6 g 5  . budesonide-formoterol (SYMBICORT) 160-4.5 MCG/ACT inhaler Inhale 2 puffs into the lungs 2 (two) times daily. 10.2 g 3  . dexlansoprazole (DEXILANT) 60 MG capsule Take 1 capsule (60 mg total) by mouth daily. 30 capsule 5  . fenofibrate (TRICOR) 48 MG tablet TAKE 1 TABLET(48 MG) BY MOUTH DAILY 90 tablet 1  . montelukast (SINGULAIR) 10 MG tablet Take 1 tablet (10 mg total) by mouth at bedtime. 30 tablet 5  . norgestimate-ethinyl estradiol (SPRINTEC 28) 0.25-35 MG-MCG tablet TAKE 1 TABLET BY MOUTH DAILY 84 tablet 0   No current facility-administered medications on file prior to visit.     Allergies  Allergen Reactions  . Codeine Nausea And Vomiting  . Hydrocodone Nausea And Vomiting  . Oxycodone Nausea And Vomiting  . Sumatriptan Other (See Comments)    Heart palpitations,SOB, chest pressure and shakiness    Past Medical History:  Diagnosis Date  . Asthma   . Asthma    last episode january 2015  . CIN I (cervical intraepithelial neoplasia I)   . Environmental and seasonal allergies   . GERD (gastroesophageal reflux disease)   . Headache   . Herpes genitalis in women   . Ureteral reflux 1975  . Vitamin D deficiency     Past Surgical History:  Procedure  Laterality Date  . [facility] STUDY N/A 09/18/2015   Procedure: [facility] STUDY;  Surgeon: Anna Silence, MD;  Location: [facility];  Service: [facility];  Laterality: N/A;  . ANTERIOR CERVICAL DECOMP/DISCECTOMY FUSION N/A 01/31/2014   Procedure: ANTERIOR CERVICAL DECOMPRESSION/DISCECTOMY FUSION CERVICAL FIVE-SIX,CERVICAL SIX-SEVEN WITH HARDWARE REMOVAL OF CODMAN PLATE AT CERVIAL FOUR-FIVE;  Surgeon: Anna Hoops, MD;  Location: [facility];  Service: Neurosurgery;  Laterality: N/A;  . BACK SURGERY Bilateral 2001   [facility] "fusion"  . CERVICAL SPINE SURGERY    . DILATATION & CURETTAGE/HYSTEROSCOPY WITH TRUECLEAR     3'14-endometrial polyp-[facility]  . DILATION AND CURETTAGE OF UTERUS    . ESOPHAGEAL MANOMETRY N/A 05/24/2013   Procedure: ESOPHAGEAL MANOMETRY (EM);  Surgeon: Anna Silence, MD;  Location: [facility];  Service: [facility];  Laterality: N/A;  . ESOPHAGOGASTRODUODENOSCOPY    . ESOPHAGOGASTRODUODENOSCOPY (EGD) WITH PROPOFOL N/A 05/24/2013   Procedure: ESOPHAGOGASTRODUODENOSCOPY (EGD) WITH PROPOFOL;  Surgeon: Anna Silence, MD;  Location: [facility];  Service: Torres;  Laterality: N/A;  . EXTERNAL EAR SURGERY     bilateral "pinning" age 48  . [facility]   retained hardware "wires"  . MANDIBLE SURGERY      Family History  Problem Relation Age of Onset  . Diabetes Mother   . Rheum arthritis Mother   . Arthritis Mother   .  Cancer Mother   . Graves' disease Sister      The PMH, PSH, Social History, Family History, Medications, and allergies have been reviewed in [facility], and have been updated if relevant.   Review of Systems  Constitutional: Negative.   HENT: Negative.   Eyes: Negative.   Respiratory: Negative.   Cardiovascular: Negative.   Gastrointestinal: Negative.   Endocrine: Negative.   Genitourinary: Negative.   Musculoskeletal: Positive for arthralgias.  Skin: Negative.   Allergic/Immunologic: Negative.   Neurological: Negative.    Hematological: Negative.   Psychiatric/Behavioral: Negative.   All other systems reviewed and are negative.      Objective:    BP 92/68 (BP Location: Left Arm, Patient Position: Sitting, Cuff Size: Normal)   Pulse 91   Temp 98.5 F (36.9 C) (Oral)   Ht '5\' 2"'  (1.575 m)   Wt 134 lb (60.8 kg)   SpO2 97%   BMI 24.51 kg/m    Physical Exam    General:  Well-developed,well-nourished,in no acute distress; alert,appropriate and cooperative throughout examination Head:  normocephalic and atraumatic.   Eyes:  vision grossly intact, PERRL Ears:  R ear normal and L ear normal externally, TMs clear bilaterally Nose:  no external deformity.   Mouth:  good dentition.   Neck:  No deformities, masses, or tenderness noted. Breasts:  No mass, nodules, thickening, tenderness, bulging, retraction, inflamation, nipple discharge or skin changes noted.   Lungs:  Normal respiratory effort, chest expands symmetrically. Lungs are clear to auscultation, no crackles or wheezes. Heart:  Normal rate and regular rhythm. S1 and S2 normal without gallop, murmur, click, rub or other extra sounds. Abdomen:  Bowel sounds positive,abdomen soft and non-tender without masses, organomegaly or hernias noted. Rectal:  no external abnormalities.   Genitalia:  Pelvic Exam:        External: normal female genitalia without lesions or masses        Vagina: normal without lesions or masses        Cervix: normal without lesions or masses        Adnexa: normal bimanual exam without masses or fullness        Uterus: normal by palpation        Pap smear: performed Msk:  No deformity or scoliosis noted of thoracic or lumbar spine.   Extremities:  No clubbing, cyanosis, edema, or deformity noted with normal full range of motion of all joints.   Neurologic:  alert & oriented X3 and gait normal.   Skin:  Intact without suspicious lesions or rashes Cervical Nodes:  No lymphadenopathy noted Axillary Nodes:  No palpable  lymphadenopathy Psych:  Cognition and judgment appear intact. Alert and cooperative with normal attention span and concentration. No apparent delusions, illusions, hallucinations      Assessment & Plan:   Well woman exam with routine gynecological exam - Plan: Cytology - PAP( [place]), Comp Met (CMET), CBC w/Diff, Lipid Profile, TSH, CANCELED: Cytology - PAP( [place])  Hypertriglyceridemia - Plan: Lipid Profile, CANCELED: Lipid panel  Chronic asthma without complication, unspecified asthma severity, unspecified whether persistent - Plan: Comp Met (CMET), CBC w/Diff  Encounter for surveillance of contraceptive pills  Pain in joint, multiple sites - Plan: Antinuclear Antib (ANA), Rheumatoid Factor, Sedimentation rate, CANCELED: Antinuclear Antib (ANA), CANCELED: Rheumatoid Factor, CANCELED: Sedimentation rate No follow-ups on file.

## 2018-04-02 ENCOUNTER — Other Ambulatory Visit: Payer: Self-pay

## 2018-04-02 ENCOUNTER — Ambulatory Visit (INDEPENDENT_AMBULATORY_CARE_PROVIDER_SITE_OTHER): Payer: BLUE CROSS/BLUE SHIELD | Admitting: Family Medicine

## 2018-04-02 ENCOUNTER — Other Ambulatory Visit (HOSPITAL_COMMUNITY)
Admission: RE | Admit: 2018-04-02 | Discharge: 2018-04-02 | Disposition: A | Discharge status: Home / Self Care | Payer: BLUE CROSS/BLUE SHIELD | Source: Ambulatory Visit | Attending: Family Medicine | Admitting: Family Medicine

## 2018-04-02 ENCOUNTER — Encounter: Payer: Self-pay | Admitting: Family Medicine

## 2018-04-02 VITALS — BP 92/68 | HR 91 | Temp 98.5°F | Ht 62.0 in | Wt 134.0 lb

## 2018-04-02 DIAGNOSIS — Z3041 Encounter for surveillance of contraceptive pills: Secondary | ICD-10-CM | POA: Diagnosis not present

## 2018-04-02 DIAGNOSIS — M2550 Pain in unspecified joint: Secondary | ICD-10-CM

## 2018-04-02 DIAGNOSIS — Z01419 Encounter for gynecological examination (general) (routine) without abnormal findings: Secondary | ICD-10-CM | POA: Insufficient documentation

## 2018-04-02 DIAGNOSIS — E781 Pure hyperglyceridemia: Secondary | ICD-10-CM

## 2018-04-02 DIAGNOSIS — J45909 Unspecified asthma, uncomplicated: Secondary | ICD-10-CM

## 2018-04-02 LAB — CBC WITH DIFFERENTIAL/PLATELET
BASOS ABS: 0.1 10*3/uL (ref 0.0–0.1)
Basophils Relative: 1.1 % (ref 0.0–3.0)
EOS ABS: 0.6 10*3/uL (ref 0.0–0.7)
Eosinophils Relative: 5.9 % — ABNORMAL HIGH (ref 0.0–5.0)
HCT: 41 % (ref 36.0–46.0)
Hemoglobin: 13.6 g/dL (ref 12.0–15.0)
Lymphocytes Relative: 27.2 % (ref 12.0–46.0)
Lymphs Abs: 2.6 10*3/uL (ref 0.7–4.0)
MCHC: 33.1 g/dL (ref 30.0–36.0)
MCV: 80.4 fl (ref 78.0–100.0)
Monocytes Absolute: 0.6 10*3/uL (ref 0.1–1.0)
Monocytes Relative: 6.7 % (ref 3.0–12.0)
NEUTROS PCT: 59.1 % (ref 43.0–77.0)
Neutro Abs: 5.6 10*3/uL (ref 1.4–7.7)
Platelets: 428 10*3/uL — ABNORMAL HIGH (ref 150.0–400.0)
RBC: 5.1 Mil/uL (ref 3.87–5.11)
RDW: 13.9 % (ref 11.5–15.5)
WBC: 9.5 10*3/uL (ref 4.0–10.5)

## 2018-04-02 LAB — LIPID PANEL
CHOLESTEROL: 207 mg/dL — AB (ref 0–200)
HDL: 77.1 mg/dL (ref >39.00)
NonHDL: 129.42
Total CHOL/HDL Ratio: 3
Triglycerides: 223 mg/dL — ABNORMAL HIGH (ref 0.0–149.0)
VLDL: 44.6 mg/dL — ABNORMAL HIGH (ref 0.0–40.0)

## 2018-04-02 LAB — COMPREHENSIVE METABOLIC PANEL
ALT: 11 U/L (ref 0–35)
AST: 18 U/L (ref 0–37)
Albumin: 4.3 g/dL (ref 3.5–5.2)
Alkaline Phosphatase: 59 U/L (ref 39–117)
BILIRUBIN TOTAL: 0.5 mg/dL (ref 0.2–1.2)
BUN: 13 mg/dL (ref 6–23)
CO2: 22 mEq/L (ref 19–32)
CREATININE: 0.92 mg/dL (ref 0.40–1.20)
Calcium: 9.6 mg/dL (ref 8.4–10.5)
Chloride: 104 mEq/L (ref 96–112)
GFR: 65.52 mL/min (ref >60.00)
Glucose, Bld: 74 mg/dL (ref 70–99)
Potassium: 4.3 mEq/L (ref 3.5–5.1)
Sodium: 136 mEq/L (ref 135–145)
TOTAL PROTEIN: 7.9 g/dL (ref 6.0–8.3)

## 2018-04-02 LAB — LDL CHOLESTEROL, DIRECT: Direct LDL: 111 mg/dL

## 2018-04-02 LAB — SEDIMENTATION RATE: Sed Rate: 40 mm/hr — ABNORMAL HIGH (ref 0–20)

## 2018-04-02 LAB — TSH: TSH: 2.24 u[IU]/mL (ref 0.35–4.50)

## 2018-04-02 MED ORDER — ACYCLOVIR 400 MG PO TABS: 400.0000 mg | ORAL_TABLET | Freq: Two times a day (BID) | ORAL | 3 refills | Status: DC

## 2018-04-02 NOTE — Assessment & Plan Note (Signed)
Reviewed preventive care protocols, scheduled due services, and updated immunizations Discussed nutrition, exercise, diet, and healthy lifestyle. Orders Placed This Encounter  Procedures  . Comp Met (CMET)  . CBC w/Diff  . Lipid Profile  . TSH  . Antinuclear Antib (ANA)  . Rheumatoid Factor  . Sedimentation rate

## 2018-04-03 LAB — RHEUMATOID FACTOR: Rheumatoid fact SerPl-aCnc: <14 IU/mL (ref <14)

## 2018-04-03 LAB — ANA: Anti Nuclear Antibody(ANA): NEGATIVE

## 2018-04-07 LAB — CYTOLOGY - PAP
Diagnosis: NEGATIVE
HPV: NOT DETECTED

## 2018-04-13 ENCOUNTER — Other Ambulatory Visit: Payer: Self-pay

## 2018-04-13 MED ORDER — BUDESONIDE-FORMOTEROL FUMARATE 160-4.5 MCG/ACT IN AERO: 2.0000 | INHALATION_SPRAY | Freq: Two times a day (BID) | RESPIRATORY_TRACT | 3 refills | Status: DC

## 2018-04-27 ENCOUNTER — Other Ambulatory Visit: Payer: Self-pay | Admitting: Family Medicine

## 2018-05-11 ENCOUNTER — Other Ambulatory Visit: Payer: Self-pay | Admitting: Family Medicine

## 2018-07-17 ENCOUNTER — Encounter: Payer: Self-pay | Admitting: Family Medicine

## 2018-07-20 ENCOUNTER — Other Ambulatory Visit: Payer: Self-pay | Admitting: Family Medicine

## 2018-07-20 MED ORDER — ERYTHROMYCIN 5 MG/GM OP OINT: 1.0000 "application " | TOPICAL_OINTMENT | Freq: Every day | OPHTHALMIC | 0 refills | Status: DC

## 2018-08-04 ENCOUNTER — Ambulatory Visit: Payer: BC Managed Care – PPO | Admitting: Allergy and Immunology

## 2018-08-04 ENCOUNTER — Encounter: Payer: Self-pay | Admitting: Allergy and Immunology

## 2018-08-04 ENCOUNTER — Other Ambulatory Visit: Payer: Self-pay

## 2018-08-04 VITALS — BP 116/78 | HR 88 | Temp 97.4°F | Resp 18 | Ht 62.0 in

## 2018-08-04 DIAGNOSIS — J3089 Other allergic rhinitis: Secondary | ICD-10-CM

## 2018-08-04 DIAGNOSIS — J4540 Moderate persistent asthma, uncomplicated: Secondary | ICD-10-CM

## 2018-08-04 DIAGNOSIS — K219 Gastro-esophageal reflux disease without esophagitis: Secondary | ICD-10-CM | POA: Diagnosis not present

## 2018-08-04 MED ORDER — MONTELUKAST SODIUM 10 MG PO TABS: 10.0000 mg | ORAL_TABLET | Freq: Every day | ORAL | 5 refills | Status: DC

## 2018-08-04 NOTE — Patient Instructions (Addendum)
  1. Continue Symbicort 160 - 2 inhalations 1-2 times a day depending on disease activity  2. Continue OTC Rhinocort one spray each nostril 1-7 times a week depending on disease activity  3. Continue montelukast 10 mg tablet 1 time per day  4. Continue Dexilant 60mg  in the morning    5. Continue ProAir HFA and antihistamine if needed  6. Continue action plan for asthma flare including adding high dose Qvar 80 Redihaler at 3 inhalations 3 times per day to Symbicort  7. Plan for progressive aerobic exercise program with interval training  8. Return to clinic in 6 months or earlier if problem  9. Obtain Fall flu vaccine (and COVID vaccine)

## 2018-08-04 NOTE — Progress Notes (Signed)
[place] - [place] - [place] - [place] - [place]   Follow-up Note  Referring Provider: Dianne Dun, MD Primary Provider: Dianne Dun, MD Date of Office Visit: 08/04/2018  Subjective:   Anna Torres (DOB: [birth year]) is a 48 y.o. female who [facility] in re-evaluation of the following:  HPI: Anna Torres returns to this clinic in reevaluation of asthma and allergic rhinitis and reflux with LPR.  Her last visit to this clinic was 03 February 2018.  She states that she has not required a systemic steroid to treat any type of airway issue and rarely uses a short acting bronchodilator and overall feels quite good regarding her asthma while using Symbicort 1 time per day on most days.  However, she cannot really exercise.  If she exercises she gets short of breath.  It should be noted that she does not do any form of exercise at all.  She has had very little issues with her nose at this point while using montelukast and occasional nasal steroid.  Her reflux is under good control.  She still occasionally has a reflux event while using her Dexilant but overall she is very satisfied with the response she has received with this medication regarding control of her reflux and swallowing issue.  Allergies as of 08/04/2018      Reactions   Codeine Nausea And Vomiting   Hydrocodone Nausea And Vomiting   Oxycodone Nausea And Vomiting   Sumatriptan Other (See Comments)   Heart palpitations,SOB, chest pressure and shakiness      Medication List    acyclovir 400 MG tablet Commonly known as: Zovirax Take 1 tablet (400 mg total) by mouth 2 (two) times daily.   budesonide 32 MCG/ACT nasal spray Commonly known as: Rhinocort Allergy Place 1 spray into both nostrils daily.   budesonide-formoterol 160-4.5 MCG/ACT inhaler Commonly known as: Symbicort Inhale 2 puffs into the lungs 2 (two) times daily.   dexlansoprazole 60 MG capsule Commonly  known as: DEXILANT Take 1 capsule (60 mg total) by mouth daily.   erythromycin ophthalmic ointment Place 1 application into both eyes at bedtime.   fenofibrate 48 MG tablet Commonly known as: TRICOR TAKE 1 TABLET(48 MG) BY MOUTH DAILY   montelukast 10 MG tablet Commonly known as: SINGULAIR Take 1 tablet (10 mg total) by mouth at bedtime.       Past Medical History:  Diagnosis Date  . Asthma   . Asthma    last episode january 2015  . CIN I (cervical intraepithelial neoplasia I)   . Environmental and seasonal allergies   . GERD (gastroesophageal reflux disease)   . Headache   . Herpes genitalis in women   . Ureteral reflux 1975  . Vitamin D deficiency     Past Surgical History:  Procedure Laterality Date  . 24 HOUR PH STUDY N/A 09/18/2015   Procedure: 24 HOUR PH STUDY;  Surgeon: Willis Modena, MD;  [facility]: [facility];  Service: Endoscopy;  Laterality: N/A;  . ANTERIOR CERVICAL DECOMP/DISCECTOMY FUSION N/A 01/31/2014   Procedure: ANTERIOR CERVICAL DECOMPRESSION/DISCECTOMY FUSION CERVICAL FIVE-SIX,CERVICAL SIX-SEVEN WITH HARDWARE REMOVAL OF CODMAN PLATE AT CERVIAL FOUR-FIVE;  Surgeon: Mariam Dollar, MD;  Location: [facility];  Service: Neurosurgery;  Laterality: N/A;  . BACK SURGERY Bilateral 2001   C4-C5 DISK "fusion"  . CERVICAL SPINE SURGERY    . DILATATION & CURETTAGE/HYSTEROSCOPY WITH TRUECLEAR     3'14-endometrial polyp-[facility]  . DILATION AND CURETTAGE OF  UTERUS    . ESOPHAGEAL MANOMETRY N/A 05/24/2013   Procedure: ESOPHAGEAL MANOMETRY (EM);  Surgeon: Arta Silence, MD;  [facility];  Service: Endoscopy;  Laterality: N/A;  . ESOPHAGOGASTRODUODENOSCOPY    . ESOPHAGOGASTRODUODENOSCOPY (EGD) WITH PROPOFOL N/A 05/24/2013   Procedure: ESOPHAGOGASTRODUODENOSCOPY (EGD) WITH PROPOFOL;  Surgeon: Arta Silence, MD;  [facility];  Service: Endoscopy;  Laterality: N/A;  . EXTERNAL EAR SURGERY     bilateral "pinning" age 64  . [place]    retained hardware "wires"  . MANDIBLE SURGERY      Review of systems negative except as noted in HPI / PMHx or noted below:  Review of Systems  Constitutional: Negative.   HENT: Negative.   Eyes: Negative.   Respiratory: Negative.   Cardiovascular: Negative.   Gastrointestinal: Negative.   Genitourinary: Negative.   Musculoskeletal: Negative.   Skin: Negative.   Neurological: Negative.   Endo/Heme/Allergies: Negative.   Psychiatric/Behavioral: Negative.      Objective:   Vitals:   08/04/18 1048  BP: 116/78  Pulse: 88  Resp: 18  Temp: (!) 97.4 F (36.3 C)  SpO2: 98%   Height: 5\' 2"  (157.5 cm)      Physical Exam Constitutional:      Appearance: She is not diaphoretic.  HENT:     Head: Normocephalic.     Right Ear: Tympanic membrane, ear canal and external ear normal.     Left Ear: Tympanic membrane, ear canal and external ear normal.     Nose: Nose normal. No mucosal edema or rhinorrhea.     Mouth/Throat:     Pharynx: Uvula midline. No oropharyngeal exudate.  Eyes:     Conjunctiva/sclera: Conjunctivae normal.  Neck:     Thyroid: No thyromegaly.     Trachea: Trachea normal. No tracheal tenderness or tracheal deviation.  Cardiovascular:     Rate and Rhythm: Normal rate and regular rhythm.     Heart sounds: Normal heart sounds, S1 normal and S2 normal. No murmur.  Pulmonary:     Effort: No respiratory distress.     Breath sounds: Normal breath sounds. No stridor. No wheezing or rales.  Lymphadenopathy:     Head:     Right side of head: No tonsillar adenopathy.     Left side of head: No tonsillar adenopathy.     Cervical: No cervical adenopathy.  Skin:    Findings: No erythema or rash.     Nails: There is no clubbing.   Neurological:     Mental Status: She is alert.     Diagnostics:    Spirometry was performed and demonstrated an FEV1 of 1.92 at 72 % of predicted.  The patient had an Asthma Control Test with the following results:  .     Assessment and Plan:   1. Asthma, moderate persistent, well-controlled   2. Other allergic rhinitis   3. LPRD (laryngopharyngeal reflux disease)     1. Continue Symbicort 160 - 2 inhalations 1-2 times a day depending on disease activity  2. Continue OTC Rhinocort one spray each nostril 1-7 times a week depending on disease activity  3. Continue montelukast 10 mg tablet 1 time per day  4. Continue Dexilant 60mg  in the morning    5. Continue ProAir HFA and antihistamine if needed  6. Continue action plan for asthma flare including adding high dose Qvar 80 Redihaler at 3 inhalations 3 times per day to Symbicort  7. Plan for progressive aerobic exercise program  with interval training  8. Return to clinic in 6 months or earlier if problem  9. Obtain Fall flu vaccine (and COVID vaccine)  Lyla Son appears to be doing quite well.  I think the one deficit in her cardiopulmonary health status at this point is the fact that she has cardiac deconditioning and does not exercise at all.  I did have a talk with her today about engaging in a progressive aerobic exercise program with interval training to help her with this issue.  She will continue to use anti-inflammatory agents for her airway and therapy directed against reflux as noted above.  Assuming she does well I will see her back in this clinic in 6 months or earlier if there is a problem.  Laurette Schimke, MD Allergy / Immunology [facility]

## 2018-08-05 ENCOUNTER — Encounter: Payer: Self-pay | Admitting: Allergy and Immunology

## 2018-09-21 ENCOUNTER — Encounter: Payer: Self-pay | Admitting: Family Medicine

## 2018-09-21 ENCOUNTER — Other Ambulatory Visit: Payer: Self-pay

## 2018-09-21 MED ORDER — BUDESONIDE-FORMOTEROL FUMARATE 160-4.5 MCG/ACT IN AERO: 2.0000 | INHALATION_SPRAY | Freq: Two times a day (BID) | RESPIRATORY_TRACT | 6 refills | Status: DC

## 2018-09-23 ENCOUNTER — Other Ambulatory Visit: Payer: Self-pay

## 2018-09-23 ENCOUNTER — Telehealth: Payer: Self-pay

## 2018-09-23 MED ORDER — NORGESTIMATE-ETH ESTRADIOL 0.25-35 MG-MCG PO TABS: 1.0000 | ORAL_TABLET | Freq: Every day | ORAL | 6 refills | Status: DC

## 2018-09-23 NOTE — Telephone Encounter (Signed)
Copied from [place] [id number]. Topic: General - Other >> Sep 22, 2018  4:39 PM Sheran Luz wrote: Patient calling to check status of Mychart request sent yesterday. Patient became very upset that medication had not been sent in. Patient states she needs this medication as soon as possible. Patient is aware of turn around time. She is requesting a call back when office reopens.

## 2018-09-24 NOTE — Telephone Encounter (Signed)
Sent in medication refill and made pt aware through mychart.

## 2018-10-27 ENCOUNTER — Other Ambulatory Visit: Payer: Self-pay | Admitting: Family Medicine

## 2018-11-28 ENCOUNTER — Other Ambulatory Visit: Payer: Self-pay | Admitting: Family Medicine

## 2018-11-30 NOTE — Telephone Encounter (Signed)
Last fill 04/02/18  #60/3 Last OV 04/02/18

## 2018-12-21 ENCOUNTER — Encounter: Payer: Self-pay | Admitting: Family Medicine

## 2018-12-22 ENCOUNTER — Other Ambulatory Visit: Payer: Self-pay | Admitting: Family Medicine

## 2018-12-22 DIAGNOSIS — R21 Rash and other nonspecific skin eruption: Secondary | ICD-10-CM

## 2018-12-30 DIAGNOSIS — L298 Other pruritus: Secondary | ICD-10-CM | POA: Diagnosis not present

## 2019-01-31 ENCOUNTER — Other Ambulatory Visit: Payer: Self-pay | Admitting: Allergy and Immunology

## 2019-02-09 ENCOUNTER — Ambulatory Visit (INDEPENDENT_AMBULATORY_CARE_PROVIDER_SITE_OTHER): Payer: 59 | Admitting: Allergy and Immunology

## 2019-02-09 ENCOUNTER — Other Ambulatory Visit: Payer: Self-pay

## 2019-02-09 ENCOUNTER — Other Ambulatory Visit: Payer: Self-pay | Admitting: *Deleted

## 2019-02-09 ENCOUNTER — Encounter: Payer: Self-pay | Admitting: Allergy and Immunology

## 2019-02-09 ENCOUNTER — Other Ambulatory Visit: Payer: Self-pay | Admitting: Allergy and Immunology

## 2019-02-09 ENCOUNTER — Telehealth: Payer: Self-pay | Admitting: *Deleted

## 2019-02-09 VITALS — BP 130/92 | HR 92 | Temp 97.7°F | Resp 18

## 2019-02-09 DIAGNOSIS — J4540 Moderate persistent asthma, uncomplicated: Secondary | ICD-10-CM | POA: Diagnosis not present

## 2019-02-09 DIAGNOSIS — Z23 Encounter for immunization: Secondary | ICD-10-CM

## 2019-02-09 DIAGNOSIS — J3089 Other allergic rhinitis: Secondary | ICD-10-CM

## 2019-02-09 DIAGNOSIS — K219 Gastro-esophageal reflux disease without esophagitis: Secondary | ICD-10-CM

## 2019-02-09 MED ORDER — DULERA 200-5 MCG/ACT IN AERO: INHALATION_SPRAY | RESPIRATORY_TRACT | 5 refills | Status: DC

## 2019-02-09 NOTE — Progress Notes (Signed)
[place] - [place] - [place] - [place] - [place]   Follow-up Note  Referring Provider: Dianne Dun, MD Primary Provider: Dianne Dun, MD Date of Office Visit: 02/09/2019  Subjective:   Anna [facility] (DOB: July 31, 1970) is a 49 y.o. female who [facility]2019 in re-evaluation of the following:  HPI: Anna Torres presents to this clinic in evaluation of asthma and allergic rhinitis and LPR.  Her last visit to this clinic was 04 August 2018.  She has really done well with her airway since her last visit.  She has not required a systemic steroid or an antibiotic to treat any type of airway issue.  She rarely uses a short acting bronchodilator while she continues to use Symbicort mostly 1 time per day and montelukast on a consistent basis.  Her nose has been OK while using a nasal steroid very rarely.  Her reflux, handled by Dr. Loreta Ave, has been under very controlled good control with Dexilant.  She still occasionally gets a little bit of a choking/coughing episode on occasion which is very rare and sometimes her choking/coughing episodes can go on for a minute or so.  She has not received the flu vaccine yet.  There is a problem with her insurance covering her medications and she needs to change both Symbicort and Dexilant.  Allergies as of 02/09/2019      Reactions   Codeine Nausea And Vomiting   Hydrocodone Nausea And Vomiting   Oxycodone Nausea And Vomiting   Sumatriptan Other (See Comments)   Heart palpitations,SOB, chest pressure and shakiness      Medication List      acyclovir 400 MG tablet Commonly known as: ZOVIRAX TAKE 1 TABLET BY MOUTH TWICE A DAY   budesonide 32 MCG/ACT nasal spray Commonly known as: Rhinocort Allergy Place 1 spray into both nostrils daily.   budesonide-formoterol 160-4.5 MCG/ACT inhaler Commonly known as: Symbicort Inhale 2 puffs into the lungs 2 (two) times daily.   dexlansoprazole 60 MG  capsule Commonly known as: DEXILANT Take 1 capsule (60 mg total) by mouth daily.   erythromycin ophthalmic ointment Place 1 application into both eyes at bedtime.   fenofibrate 48 MG tablet Commonly known as: TRICOR TAKE 1 TABLET BY MOUTH EVERY DAY   montelukast 10 MG tablet Commonly known as: SINGULAIR TAKE 1 TABLET BY MOUTH EVERYDAY AT BEDTIME   norgestimate-ethinyl estradiol 0.25-35 MG-MCG tablet Commonly known as: Sprintec 28 Take 1 tablet by mouth daily.       Past Medical History:  Diagnosis Date  . Asthma   . Asthma    last episode january 2015  . CIN I (cervical intraepithelial neoplasia I)   . Environmental and seasonal allergies   . GERD (gastroesophageal reflux disease)   . Headache   . Herpes genitalis in women   . Ureteral reflux 1975  . Vitamin D deficiency     Past Surgical History:  Procedure Laterality Date  . 24 HOUR PH STUDY N/A 09/18/2015   Procedure: 24 HOUR PH STUDY;  Surgeon: Willis Modena, MD;  [facility];  Service: Endoscopy;  Laterality: N/A;  . ANTERIOR CERVICAL DECOMP/DISCECTOMY FUSION N/A 01/31/2014   Procedure: ANTERIOR CERVICAL DECOMPRESSION/DISCECTOMY FUSION CERVICAL FIVE-SIX,CERVICAL SIX-SEVEN WITH HARDWARE REMOVAL OF CODMAN PLATE AT CERVIAL FOUR-FIVE;  Surgeon: Mariam Dollar, MD;  Location: [facility] OR;  Service: Neurosurgery;  Laterality: N/A;  . BACK SURGERY Bilateral 2001   C4-C5 DISK "fusion"  . CERVICAL SPINE SURGERY    .  DILATATION & CURETTAGE/HYSTEROSCOPY WITH TRUECLEAR     3'14-endometrial polyp-[facility]  . DILATION AND CURETTAGE OF UTERUS    . ESOPHAGEAL MANOMETRY N/A 05/24/2013   Procedure: ESOPHAGEAL MANOMETRY (EM);  Surgeon: Arta Silence, MD;  [facility];  Service: Endoscopy;  Laterality: N/A;  . ESOPHAGOGASTRODUODENOSCOPY    . ESOPHAGOGASTRODUODENOSCOPY (EGD) WITH PROPOFOL N/A 05/24/2013   Procedure: ESOPHAGOGASTRODUODENOSCOPY (EGD) WITH PROPOFOL;  Surgeon: Arta Silence, MD;  [facility];   Service: Endoscopy;  Laterality: N/A;  . EXTERNAL EAR SURGERY     bilateral "pinning" age 65  . [place]   retained hardware "wires"  . MANDIBLE SURGERY      Review of systems negative except as noted in HPI / PMHx or noted below:  Review of Systems  Constitutional: Negative.   HENT: Negative.   Eyes: Negative.   Respiratory: Negative.   Cardiovascular: Negative.   Gastrointestinal: Negative.   Genitourinary: Negative.   Musculoskeletal: Negative.   Skin: Negative.   Neurological: Negative.   Endo/Heme/Allergies: Negative.   Psychiatric/Behavioral: Negative.      Objective:   Vitals:   02/09/19 1105  BP: (!) 130/92  Pulse: 92  Resp: 18  Temp: 97.7 F (36.5 C)  SpO2: 96%          Physical Exam Constitutional:      Appearance: She is not diaphoretic.  HENT:     Head: Normocephalic.     Right Ear: Tympanic membrane, ear canal and external ear normal.     Left Ear: Tympanic membrane, ear canal and external ear normal.     Nose: Nose normal. No mucosal edema or rhinorrhea.     Mouth/Throat:     Pharynx: Uvula midline. No oropharyngeal exudate.  Eyes:     Conjunctiva/sclera: Conjunctivae normal.  Neck:     Thyroid: No thyromegaly.     Trachea: Trachea normal. No tracheal tenderness or tracheal deviation.  Cardiovascular:     Rate and Rhythm: Normal rate and regular rhythm.     Heart sounds: Normal heart sounds, S1 normal and S2 normal. No murmur.  Pulmonary:     Effort: No respiratory distress.     Breath sounds: Normal breath sounds. No stridor. No wheezing or rales.  Lymphadenopathy:     Head:     Right side of head: No tonsillar adenopathy.     Left side of head: No tonsillar adenopathy.     Cervical: No cervical adenopathy.  Skin:    Findings: No erythema or rash.     Nails: There is no clubbing.  Neurological:     Mental Status: She is alert.     Diagnostics:    Spirometry was performed and demonstrated an FEV1 of 1.86  at 70 % of predicted.  Assessment and Plan:   1. Asthma, moderate persistent, well-controlled   2. Other allergic rhinitis   3. LPRD (laryngopharyngeal reflux disease)   4. Need for immunization against influenza     1.  Change Symbicort to Dulera 200- 2 inhalations 1-2 times a day depending on disease activity  2. Continue OTC Rhinocort one spray each nostril 1-7 times a week depending on disease activity  3. Continue montelukast 10 mg tablet 1 time per day  4. Continue therapy for reflux as directed by Dr. Collene Mares  5. Continue ProAir HFA and antihistamine if needed  6. Continue action plan for asthma flare including adding high dose Qvar 80 Redihaler at 3 inhalations 3 times per day to [facility]  7.  Flu vaccine administered in the clinic today  8. Return to clinic in 6 months or earlier if problem  9.  Obtain Covid vaccine when available  Overall Anna Torres has done very well on her current plan.  Because of an insurance issue will need to change her Symbicort and will give her Dulera.  She understands her medications quite well and the appropriate use of her medications and the appropriate dosing of her medications depending on disease activity.  Assuming she continues to do well I will see her back in his clinic in 6 months or earlier if there is a problem.  Laurette Schimke, MD Allergy / Immunology [facility]

## 2019-02-09 NOTE — Telephone Encounter (Signed)
PA has been submitted via CoverMyMeds for Dulera 200 and is currently pending approval or denial.

## 2019-02-09 NOTE — Patient Instructions (Addendum)
  1.  Change Symbicort to Dulera 200- 2 inhalations 1-2 times a day depending on disease activity  2. Continue OTC Rhinocort one spray each nostril 1-7 times a week depending on disease activity  3. Continue montelukast 10 mg tablet 1 time per day  4. Continue therapy for reflux as directed by Dr. Loreta Ave  5. Continue ProAir HFA and antihistamine if needed  6. Continue action plan for asthma flare including adding high dose Qvar 80 Redihaler at 3 inhalations 3 times per day to [facility]  7.  Flu vaccine administered in the clinic today  8. Return to clinic in 6 months or earlier if problem  9.  Obtain Covid vaccine when available

## 2019-02-10 ENCOUNTER — Encounter: Payer: Self-pay | Admitting: Allergy and Immunology

## 2019-02-10 NOTE — Telephone Encounter (Signed)
Fax from [employer] has been approved 02/09/2019 until 02/09/2020. Pharmacy has been notified of approval via fax.

## 2019-02-13 ENCOUNTER — Other Ambulatory Visit: Payer: Self-pay | Admitting: Family Medicine

## 2019-02-14 ENCOUNTER — Encounter: Payer: Self-pay | Admitting: Family Medicine

## 2019-02-14 ENCOUNTER — Encounter: Payer: Self-pay | Admitting: Allergy and Immunology

## 2019-02-15 ENCOUNTER — Other Ambulatory Visit: Payer: Self-pay | Admitting: *Deleted

## 2019-02-15 NOTE — Telephone Encounter (Signed)
Last OV 04/02/18 Last fill 10/27/18  #90/0

## 2019-02-16 ENCOUNTER — Other Ambulatory Visit: Payer: Self-pay | Admitting: *Deleted

## 2019-02-16 MED ORDER — FLUTICASONE-SALMETEROL 113-14 MCG/ACT IN AEPB: 2.0000 | INHALATION_SPRAY | Freq: Two times a day (BID) | RESPIRATORY_TRACT | 5 refills | Status: DC

## 2019-02-26 ENCOUNTER — Other Ambulatory Visit: Payer: Self-pay | Admitting: Allergy and Immunology

## 2019-02-28 ENCOUNTER — Other Ambulatory Visit: Payer: Self-pay | Admitting: Family Medicine

## 2019-04-21 ENCOUNTER — Ambulatory Visit: Payer: BLUE CROSS/BLUE SHIELD | Admitting: Medical

## 2019-04-26 ENCOUNTER — Other Ambulatory Visit: Payer: Self-pay

## 2019-04-27 ENCOUNTER — Other Ambulatory Visit: Payer: Self-pay

## 2019-04-27 ENCOUNTER — Other Ambulatory Visit: Payer: Self-pay | Admitting: Medical

## 2019-04-27 ENCOUNTER — Ambulatory Visit (HOSPITAL_BASED_OUTPATIENT_CLINIC_OR_DEPARTMENT_OTHER)
Admission: RE | Admit: 2019-04-27 | Discharge: 2019-04-27 | Disposition: A | Discharge status: Home / Self Care | Payer: 59 | Source: Ambulatory Visit | Attending: Medical | Admitting: Medical

## 2019-04-27 ENCOUNTER — Encounter: Payer: Self-pay | Admitting: Medical

## 2019-04-27 ENCOUNTER — Ambulatory Visit: Payer: 59 | Admitting: Medical

## 2019-04-27 VITALS — BP 130/75 | HR 110 | Ht 62.0 in | Wt 133.4 lb

## 2019-04-27 DIAGNOSIS — M25562 Pain in left knee: Secondary | ICD-10-CM | POA: Diagnosis present

## 2019-04-27 DIAGNOSIS — M2550 Pain in unspecified joint: Secondary | ICD-10-CM | POA: Diagnosis not present

## 2019-04-27 DIAGNOSIS — R21 Rash and other nonspecific skin eruption: Secondary | ICD-10-CM | POA: Diagnosis not present

## 2019-04-27 MED ORDER — TRIAMCINOLONE ACETONIDE 0.025 % EX OINT: 1.0000 "application " | TOPICAL_OINTMENT | Freq: Two times a day (BID) | CUTANEOUS | 0 refills | Status: DC

## 2019-04-27 NOTE — Patient Instructions (Signed)
For hx of arthralgias, knee, pain and elbow pain, I put in xray order for knee and inflammatory lab studies. Will follow studies and then decide on potential meds. But might refer you to joint specialist or sports medicine.  For skin rash which I think more eczema like, I want you to keep cool, moisturize area if rash occurs and can use triamcinolone if needed(but not on face). If rash occurs on face can use otc hyrdocortisone..   Follow up 10-14 days or as needed

## 2019-04-27 NOTE — Progress Notes (Signed)
Subjective:    Patient ID: Anna Torres, female    DOB: [birth year], 49 y.o.   MRN: [id number]  HPI  Pt in for first time.  Pt was with [facility] location and she is switching. Pt does have some joint pain recently in left knee. Also hx of some left 4th dip area pain, left finger pain and left elbow pain.  Pt states on and off joint pain for 1 yr or more. Last 3-4 months pain more constant. Left knee is more painful..  Pt joints are mostly sore in morning.  Pt states her mom has RA. Pt wants test today. Also notes some feet pain at times.   Pt sees allergies for asthma and allergies.  A year ago had negative inflammatory lab studies.   Pt also updates me she has genital herpes. She thinks she at times get lesion in various parts of her body. Pt does have acylovir every other day for chronic suppression. Recent small bumps that were itching left hamstring area.(no sharp stabbing type pain to left hamstring) Pt think she has had vaccine for chicken pox but never actually had chicken pox. She grew up in [place].   When gets random itchy area will use cortisone cream When pt was younger she had ezcema and had antecubital type rash.    Review of Systems  Constitutional: Negative for chills, fatigue and fever.  Respiratory: Negative for cough, chest tightness, shortness of breath and wheezing.   Cardiovascular: Negative for chest pain and palpitations.  Gastrointestinal: Negative for abdominal pain.  Musculoskeletal: Positive for arthralgias.  Skin: Negative for rash.  Neurological: Negative for dizziness.  Hematological: Negative for adenopathy. Does not bruise/bleed easily.  Psychiatric/Behavioral: Negative for behavioral problems and confusion.    Past Medical History:  Diagnosis Date  . Asthma   . Asthma    last episode january 2015  . CIN I (cervical intraepithelial neoplasia I)   . Environmental and seasonal allergies   . GERD (gastroesophageal reflux disease)   .  Headache   . Herpes genitalis in women   . Ureteral reflux 1975  . Vitamin D deficiency      Social History   Socioeconomic History  . Marital status: Divorced    Spouse name: Not on file  . Number of children: Not on file  . Years of education: Not on file  . Highest education level: Not on file  Occupational History  . Not on file  Tobacco Use  . Smoking status: Never Smoker  . Smokeless tobacco: Never Used  Substance and Sexual Activity  . Alcohol use: Yes    Alcohol/week: 0.0 standard drinks    Comment: rare social  . Drug use: No  . Sexual activity: Yes    Birth control/protection: Pill  Other Topics Concern  . Not on file  Social History Narrative   ** Merged History Encounter **       Social Determinants of Health   Financial Resource Strain:   . Difficulty of Paying Living Expenses:   Food Insecurity:   . Worried About Charity fundraiser in the Last Year:   . Arboriculturist in the Last Year:   Transportation Needs:   . Film/video editor (Medical):   Marland Kitchen Lack of Transportation (Non-Medical):   Physical Activity:   . Days of Exercise per Week:   . Minutes of Exercise per Session:   Stress:   . Feeling of Stress :   Social Connections:   .  Frequency of Communication with Friends and Family:   . Frequency of Social Gatherings with Friends and Family:   . Attends Religious Services:   . Active Member of Clubs or Organizations:   . Attends Banker Meetings:   Marland Kitchen Marital Status:   Intimate Partner Violence:   . Fear of Current or Ex-Partner:   . Emotionally Abused:   Marland Kitchen Physically Abused:   . Sexually Abused:     Past Surgical History:  Procedure Laterality Date  . 24 HOUR PH STUDY N/A 09/18/2015   Procedure: 24 HOUR PH STUDY;  Surgeon: Willis Modena, MD;  Location: [facility];  Service: Endoscopy;  Laterality: N/A;  . ANTERIOR CERVICAL DECOMP/DISCECTOMY FUSION N/A 01/31/2014   Procedure: ANTERIOR CERVICAL DECOMPRESSION/DISCECTOMY  FUSION CERVICAL FIVE-SIX,CERVICAL SIX-SEVEN WITH HARDWARE REMOVAL OF CODMAN PLATE AT CERVIAL FOUR-FIVE;  Surgeon: Mariam Dollar, MD;  Location: [facility];  Service: Neurosurgery;  Laterality: N/A;  . BACK SURGERY Bilateral 2001   C4-C5 DISK "fusion"  . CERVICAL SPINE SURGERY    . DILATATION & CURETTAGE/HYSTEROSCOPY WITH TRUECLEAR     3'14-endometrial polyp-[facility]  . DILATION AND CURETTAGE OF UTERUS    . ESOPHAGEAL MANOMETRY N/A 05/24/2013   Procedure: ESOPHAGEAL MANOMETRY (EM);  Surgeon: Willis Modena, MD;  Location: [facility];  Service: Endoscopy;  Laterality: N/A;  . ESOPHAGOGASTRODUODENOSCOPY    . ESOPHAGOGASTRODUODENOSCOPY (EGD) WITH PROPOFOL N/A 05/24/2013   Procedure: ESOPHAGOGASTRODUODENOSCOPY (EGD) WITH PROPOFOL;  Surgeon: Willis Modena, MD;  Location: [facility];  Service: Endoscopy;  Laterality: N/A;  . EXTERNAL EAR SURGERY     bilateral "pinning" age 75  . MANDIBLE FRACTURE SURGERY  1990   retained hardware "wires"  . MANDIBLE SURGERY      Family History  Problem Relation Age of Onset  . Diabetes Mother   . Rheum arthritis Mother   . Arthritis Mother   . Cancer Mother   . Graves' disease Sister     Allergies  Allergen Reactions  . Codeine Nausea And Vomiting  . Hydrocodone Nausea And Vomiting  . Oxycodone Nausea And Vomiting  . Sumatriptan Other (See Comments)    Heart palpitations,SOB, chest pressure and shakiness    Current Outpatient Medications on File Prior to Visit  Medication Sig Dispense Refill  . acyclovir (ZOVIRAX) 400 MG tablet TAKE 1 TABLET BY MOUTH TWICE A DAY 60 tablet 2  . budesonide (RHINOCORT ALLERGY) 32 MCG/ACT nasal spray Place 1 spray into both nostrils daily. 8.6 g 5  . budesonide-formoterol (SYMBICORT) 160-4.5 MCG/ACT inhaler Inhale 2 puffs into the lungs 2 (two) times daily. 10.2 g 6  . dexlansoprazole (DEXILANT) 60 MG capsule Take 1 capsule (60 mg total) by mouth daily. 30 capsule 5  . DULERA 200-5 MCG/ACT AERO INHALE 2 PUFFS INTO THE LUNGS  1-2 TIMES DEPENDING ON DISEASE ACTIVITY 13 g 5  . fenofibrate (TRICOR) 48 MG tablet TAKE 1 TABLET BY MOUTH EVERY DAY 90 tablet 0  . Fluticasone-Salmeterol (AIRDUO RESPICLICK 113/14) 113-14 MCG/ACT AEPB Inhale 2 puffs into the lungs 2 (two) times daily. (Patient not taking: Reported on 04/27/2019) 1 each 5  . montelukast (SINGULAIR) 10 MG tablet TAKE 1 TABLET BY MOUTH EVERYDAY AT BEDTIME 30 tablet 5  . norgestimate-ethinyl estradiol (SPRINTEC 28) 0.25-35 MG-MCG tablet Take 1 tablet by mouth daily. 3 Package 6   No current facility-administered medications on file prior to visit.    BP (!) 130/99   Pulse (!) 122   Ht 5\' 2"  (1.575 m)   Wt 133 lb 6.4  oz (60.5 kg)   SpO2 99%   BMI 24.40 kg/m       Objective:   Physical Exam  General Mental Status- Alert. General Appearance- Not in acute distress.   Skin General: Color- Normal Color. Moisture- Normal Moisture.  Neck Carotid Arteries- Normal color. Moisture- Normal Moisture. No carotid bruits. No JVD.  Chest and Lung Exam Auscultation: Breath Sounds:-Normal.  Cardiovascular Auscultation:Rythm- Regular. Murmurs & Other Heart Sounds:Auscultation of the heart reveals- No Murmurs.  Abdomen Inspection:-Inspeection Normal. Palpation/Percussion:Note:No mass. Palpation and Percussion of the abdomen reveal- Non Tender, Non Distended + BS, no rebound or guarding.   Neurologic Cranial Nerve exam:- CN III-XII intact(No nystagmus), symmetric smile. Strength:- 5/5 equal and symmetric strength both upper and lower extremities.   Skin- left hamstring area small scattered papular rash.  Left knee- no obvious swelling. No warmth and no crepitus. But mild tender to palpation tibial plateau.  Left elbow- mild tender to palpation lateral epicondyle. Left hand- on gripping mild 4th and 5th digit tender.     Assessment & Plan:  For hx of arthralgias, knee, pain and elbow pain, I put in xray order for knee and inflammatory lab studies. Will  follow studies and then decide on potential meds. But might refer you to joint specialist or sports medicine.  For skin rash which I think more eczema like, I want you to keep cool, moisturize area if rash occurs and can use triamcinolone if needed(but not on face). If rash occurs on face can use otc hyrdocortisone..   Follow up 10-14 days or as needed  Esperanza Richters, PA-C   Time spent with  patient today was  30   minutes which consisted of chart review, discussing diagnosis, work up,  Treatment plan, potential referral and documentation.

## 2019-04-28 ENCOUNTER — Telehealth: Payer: Self-pay | Admitting: Medical

## 2019-04-28 DIAGNOSIS — M2550 Pain in unspecified joint: Secondary | ICD-10-CM

## 2019-04-28 DIAGNOSIS — R700 Elevated erythrocyte sedimentation rate: Secondary | ICD-10-CM

## 2019-04-28 LAB — URIC ACID: Uric Acid, Serum: 3.7 mg/dL (ref 2.4–7.0)

## 2019-04-28 LAB — C-REACTIVE PROTEIN: CRP: 2.6 mg/dL (ref 0.5–20.0)

## 2019-04-28 LAB — SEDIMENTATION RATE: Sed Rate: 68 mm/hr — ABNORMAL HIGH (ref 0–20)

## 2019-04-28 NOTE — Telephone Encounter (Signed)
  Referral to rheumatologist placed. 

## 2019-04-29 LAB — ANA: Anti Nuclear Antibody (ANA): POSITIVE — AB

## 2019-04-29 LAB — ANTI-NUCLEAR AB-TITER (ANA TITER): ANA Titer 1: 1:40 {titer} — ABNORMAL HIGH

## 2019-04-29 LAB — RHEUMATOID FACTOR: Rheumatoid fact SerPl-aCnc: <14 IU/mL (ref <14)

## 2019-05-04 ENCOUNTER — Telehealth: Payer: Self-pay

## 2019-05-04 NOTE — Telephone Encounter (Signed)
Patient called in needing  Anna Torres  Or the nusre to give her a call back to discuss her test results as soon as possible at [phone]

## 2019-05-04 NOTE — Telephone Encounter (Signed)
I spoke with  patient and she would like to referred to Dr.Syed

## 2019-05-04 NOTE — Telephone Encounter (Signed)
Called pt and lvm

## 2019-05-05 NOTE — Telephone Encounter (Signed)
Uploaded to [facility]/Dr. Kathi Ludwig via [employer]

## 2019-05-17 ENCOUNTER — Other Ambulatory Visit: Payer: Self-pay | Admitting: Medical

## 2019-05-17 NOTE — Telephone Encounter (Signed)
Rx triamcinolone sent to pt pharmacy. °

## 2019-05-24 ENCOUNTER — Other Ambulatory Visit: Payer: Self-pay

## 2019-05-24 MED ORDER — ACYCLOVIR 400 MG PO TABS: 400.0000 mg | ORAL_TABLET | Freq: Two times a day (BID) | ORAL | 2 refills | Status: DC

## 2019-05-25 ENCOUNTER — Encounter: Payer: Self-pay | Admitting: Medical

## 2019-05-27 ENCOUNTER — Telehealth: Payer: Self-pay | Admitting: Medical

## 2019-05-27 MED ORDER — MUPIROCIN 2 % EX OINT: TOPICAL_OINTMENT | CUTANEOUS | 0 refills | Status: DC

## 2019-05-27 NOTE — Telephone Encounter (Signed)
Rx mupirocin sent to pt pharmacy. 

## 2019-06-16 ENCOUNTER — Other Ambulatory Visit: Payer: Self-pay

## 2019-06-16 MED ORDER — FENOFIBRATE 48 MG PO TABS: 48.0000 mg | ORAL_TABLET | Freq: Every day | ORAL | 0 refills | Status: DC

## 2019-07-27 ENCOUNTER — Encounter: Payer: Self-pay | Admitting: Medical

## 2019-07-28 ENCOUNTER — Telehealth: Payer: Self-pay | Admitting: Medical

## 2019-07-28 DIAGNOSIS — R232 Flushing: Secondary | ICD-10-CM

## 2019-07-28 DIAGNOSIS — N926 Irregular menstruation, unspecified: Secondary | ICD-10-CM

## 2019-07-28 NOTE — Telephone Encounter (Signed)
Refer to gyn 

## 2019-07-31 ENCOUNTER — Other Ambulatory Visit: Payer: Self-pay | Admitting: Allergy and Immunology

## 2019-08-17 ENCOUNTER — Encounter: Payer: Self-pay | Admitting: Allergy and Immunology

## 2019-08-17 ENCOUNTER — Ambulatory Visit (INDEPENDENT_AMBULATORY_CARE_PROVIDER_SITE_OTHER): Payer: 59 | Admitting: Allergy and Immunology

## 2019-08-17 ENCOUNTER — Other Ambulatory Visit: Payer: Self-pay

## 2019-08-17 DIAGNOSIS — J3089 Other allergic rhinitis: Secondary | ICD-10-CM

## 2019-08-17 DIAGNOSIS — J4540 Moderate persistent asthma, uncomplicated: Secondary | ICD-10-CM | POA: Diagnosis not present

## 2019-08-17 DIAGNOSIS — K219 Gastro-esophageal reflux disease without esophagitis: Secondary | ICD-10-CM

## 2019-08-17 MED ORDER — FLUTICASONE-SALMETEROL 113-14 MCG/ACT IN AEPB: INHALATION_SPRAY | RESPIRATORY_TRACT | 5 refills | Status: DC

## 2019-08-17 NOTE — Patient Instructions (Addendum)
  1.  Continue Generic fluticasone-salmeterol 113 - 2 inhalations 1-2 times a day depending on disease activity  2. OTC rhinocort / nasacort / generic 1 spray each nostril 1-7 times a week depending on disease activity  3. Continue montelukast 10 mg tablet 1 time per day  4. Continue therapy for reflux as directed by Dr. Loreta Ave  5. Continue ProAir HFA and antihistamine if needed  6. Obtain fall Flu vaccine   7. Return to clinic in 12 months or earlier if problem

## 2019-08-17 NOTE — Progress Notes (Signed)
[place] - [place] - [place] - [place] - [place]   Follow-up Note  Referring Provider: Dianne Dun, MD Primary Provider: Marisue Brooklyn Date of Office Visit: 08/17/2019  Subjective:   Anna Torres (DOB: 13-Apr-1970) is a 49 y.o. female who [facility] in re-evaluation of the following:  HPI: [name] presents to this clinic in evaluation of asthma and allergic rhinitis and LPR.  Her last visit to this clinic was 09 February 2019.  As has been the case for a while she has really done well with her asthma and has not required a systemic steroid to treat any type of exacerbation and rarely uses a short acting bronchodilator and can exert herself without any difficulty while she continues currently on generic Advair 113 - 2 inhalations twice a day.  Likewise, she has had very little problems with her nose and has not required an antibiotic to treat an episode of sinusitis while she occasionally uses nasal steroid and occasionally an over-the-counter "Zincon" spray for her nose.  She continues on montelukast consistently.  Her reflux is under very good control as directed by Dr. Loreta Ave and currently is using Dexilant.  She has received 2 Pfizer Covid vaccinations.  Allergies as of 08/17/2019      Reactions   Codeine Nausea And Vomiting   Hydrocodone Nausea And Vomiting   Oxycodone Nausea And Vomiting   Sumatriptan Other (See Comments)   Heart palpitations,SOB, chest pressure and shakiness      Medication List      acyclovir 400 MG tablet Commonly known as: ZOVIRAX Take 1 tablet (400 mg total) by mouth 2 (two) times daily.   dexlansoprazole 60 MG capsule Commonly known as: DEXILANT Take 1 capsule (60 mg total) by mouth daily.   fenofibrate 48 MG tablet Commonly known as: TRICOR Take 1 tablet (48 mg total) by mouth daily.   Fluticasone-Salmeterol 113-14 MCG/ACT Aepb TAKE 2 PUFFS BY MOUTH TWICE A DAY     montelukast 10 MG tablet Commonly known as: SINGULAIR TAKE 1 TABLET BY MOUTH EVERYDAY AT BEDTIME   norgestimate-ethinyl estradiol 0.25-35 MG-MCG tablet Commonly known as: Sprintec 28 Take 1 tablet by mouth daily.       Past Medical History:  Diagnosis Date   Asthma    Asthma    last episode january 2015   CIN I (cervical intraepithelial neoplasia I)    Environmental and seasonal allergies    GERD (gastroesophageal reflux disease)    Headache    Herpes genitalis in women    Ureteral reflux 1975   Vitamin D deficiency     Past Surgical History:  Procedure Laterality Date   67 HOUR PH STUDY N/A 09/18/2015   Procedure: 24 HOUR PH STUDY;  Surgeon: Willis Modena, MD;  Location: [facility];  Service: Endoscopy;  Laterality: N/A;   ANTERIOR CERVICAL DECOMP/DISCECTOMY FUSION N/A 01/31/2014   Procedure: ANTERIOR CERVICAL DECOMPRESSION/DISCECTOMY FUSION CERVICAL FIVE-SIX,CERVICAL SIX-SEVEN WITH HARDWARE REMOVAL OF CODMAN PLATE AT CERVIAL FOUR-FIVE;  Surgeon: Mariam Dollar, MD;  Location: [facility] OR;  Service: Neurosurgery;  Laterality: N/A;   BACK SURGERY Bilateral 2001   C4-C5 DISK "fusion"   CERVICAL SPINE SURGERY     DILATATION & CURETTAGE/HYSTEROSCOPY WITH TRUECLEAR     3'14-endometrial polyp-[facility]   DILATION AND CURETTAGE OF UTERUS     ESOPHAGEAL MANOMETRY N/A 05/24/2013   Procedure: ESOPHAGEAL MANOMETRY (EM);  Surgeon: Willis Modena, MD;  Location: [facility];  Service: Endoscopy;  Laterality: N/A;   ESOPHAGOGASTRODUODENOSCOPY     ESOPHAGOGASTRODUODENOSCOPY (EGD) WITH PROPOFOL N/A 05/24/2013   Procedure: ESOPHAGOGASTRODUODENOSCOPY (EGD) WITH PROPOFOL;  Surgeon: Willis Modena, MD;  Location: [facility];  Service: Endoscopy;  Laterality: N/A;   EXTERNAL EAR SURGERY     bilateral "pinning" age 47   MANDIBLE FRACTURE SURGERY  1990   retained hardware "wires"   MANDIBLE SURGERY      Review of systems negative except as noted in HPI / PMHx or noted  below:  Review of Systems  Constitutional: Negative.   HENT: Negative.   Eyes: Negative.   Respiratory: Negative.   Cardiovascular: Negative.   Gastrointestinal: Negative.   Genitourinary: Negative.   Musculoskeletal: Negative.   Skin: Negative.   Neurological: Negative.   Endo/Heme/Allergies: Negative.   Psychiatric/Behavioral: Negative.      Objective:   Vitals:   08/17/19 1040  BP: 126/84  Pulse: 83  Resp: 16  Temp: 98 F (36.7 C)  SpO2: 94%   Height: 5\' 2"  (157.5 cm)  Weight: 134 lb (60.8 kg)   Physical Exam Constitutional:      Appearance: She is not diaphoretic.  HENT:     Head: Normocephalic.     Right Ear: Tympanic membrane, ear canal and external ear normal.     Left Ear: Tympanic membrane, ear canal and external ear normal.     Nose: Nose normal. No mucosal edema or rhinorrhea.     Mouth/Throat:     Pharynx: Uvula midline. No oropharyngeal exudate.  Eyes:     Conjunctiva/sclera: Conjunctivae normal.  Neck:     Thyroid: No thyromegaly.     Trachea: Trachea normal. No tracheal tenderness or tracheal deviation.  Cardiovascular:     Rate and Rhythm: Normal rate and regular rhythm.     Heart sounds: Normal heart sounds, S1 normal and S2 normal. No murmur heard.   Pulmonary:     Effort: No respiratory distress.     Breath sounds: Normal breath sounds. No stridor. No wheezing or rales.  Lymphadenopathy:     Head:     Right side of head: No tonsillar adenopathy.     Left side of head: No tonsillar adenopathy.     Cervical: No cervical adenopathy.  Skin:    Findings: No erythema or rash.     Nails: There is no clubbing.  Neurological:     Mental Status: She is alert.      Diagnostics:    Spirometry was performed and demonstrated an FEV1 of 1.82 at 69 % of predicted.  Assessment and Plan:   1. Asthma, moderate persistent, well-controlled   2. Other allergic rhinitis   3. LPRD (laryngopharyngeal reflux disease)     1.  Continue Generic  fluticasone-salmeterol 113 - 2 inhalations 1-2 times a day depending on disease activity  2. OTC rhinocort / nasacort / generic 1 spray each nostril 1-7 times a week depending on disease activity  3. Continue montelukast 10 mg tablet 1 time per day  4. Continue therapy for reflux as directed by Dr.  5. Continue ProAir HFA and antihistamine if needed  6. Obtain fall Flu vaccine   7. Return to clinic in 12 months or earlier if problem   Loreta Ave is really doing very well and there may be an opportunity to have her consolidate some of her medical treatment.  She can attempt to use generic Advair just 1 time per day instead of twice a day and obviously would go up to  twice a day on a consistent basis if she develops a problem at a lower dose.  She has the option of using some nasal steroid.  She will remain on montelukast on a consistent basis and also continue to treat her reflux as directed by Dr. Loreta Ave.  I will see her back in this clinic in 1 year or earlier if there is a problem.  Laurette Schimke, MD Allergy / Immunology [facility]

## 2019-08-18 ENCOUNTER — Encounter: Payer: Self-pay | Admitting: Allergy and Immunology

## 2019-08-24 ENCOUNTER — Encounter: Payer: Self-pay | Admitting: Allergy and Immunology

## 2019-08-25 ENCOUNTER — Other Ambulatory Visit: Payer: Self-pay | Admitting: *Deleted

## 2019-08-26 ENCOUNTER — Other Ambulatory Visit: Payer: Self-pay | Admitting: Allergy and Immunology

## 2019-08-26 ENCOUNTER — Other Ambulatory Visit: Payer: Self-pay | Admitting: *Deleted

## 2019-08-26 MED ORDER — FLUTICASONE-SALMETEROL 113-14 MCG/ACT IN AEPB: 2.0000 | INHALATION_SPRAY | Freq: Two times a day (BID) | RESPIRATORY_TRACT | 5 refills | Status: DC

## 2019-09-07 ENCOUNTER — Ambulatory Visit: Payer: 59 | Admitting: Nurse Practitioner

## 2019-09-07 ENCOUNTER — Telehealth: Payer: Self-pay

## 2019-09-07 MED ORDER — FENOFIBRATE 48 MG PO TABS: 48.0000 mg | ORAL_TABLET | Freq: Every day | ORAL | 0 refills | Status: DC

## 2019-09-07 NOTE — Telephone Encounter (Signed)
Medication refill

## 2019-10-31 ENCOUNTER — Encounter: Payer: Self-pay | Admitting: Medical

## 2019-11-01 MED ORDER — NORGESTIMATE-ETH ESTRADIOL 0.25-35 MG-MCG PO TABS: 1.0000 | ORAL_TABLET | Freq: Every day | ORAL | 0 refills | Status: DC

## 2019-11-19 ENCOUNTER — Telehealth: Payer: Self-pay | Admitting: Medical

## 2019-11-19 MED ORDER — NORGESTIMATE-ETH ESTRADIOL 0.25-35 MG-MCG PO TABS: 1.0000 | ORAL_TABLET | Freq: Every day | ORAL | 3 refills | Status: DC

## 2019-11-19 NOTE — Telephone Encounter (Signed)
Can you send in 90 day supply for patient of birth control

## 2019-11-19 NOTE — Telephone Encounter (Signed)
Rx sprintec sent to pt pharmacy.

## 2019-11-29 ENCOUNTER — Encounter: Payer: Self-pay | Admitting: Medical

## 2019-11-30 ENCOUNTER — Telehealth: Payer: Self-pay | Admitting: Medical

## 2019-11-30 DIAGNOSIS — K219 Gastro-esophageal reflux disease without esophagitis: Secondary | ICD-10-CM

## 2019-11-30 MED ORDER — FAMOTIDINE 20 MG PO TABS
20.0000 mg | ORAL_TABLET | Freq: Two times a day (BID) | ORAL | 1 refills | Status: DC
Start: 2019-11-30 — End: 2020-01-27

## 2019-11-30 NOTE — Telephone Encounter (Signed)
Rx famotadine sent to pt pharmacy.

## 2019-11-30 NOTE — Telephone Encounter (Signed)
Referral to gi placed. °

## 2019-12-02 ENCOUNTER — Encounter: Payer: Self-pay | Admitting: Gastroenterology

## 2019-12-02 ENCOUNTER — Other Ambulatory Visit: Payer: Self-pay | Admitting: Medical

## 2019-12-19 ENCOUNTER — Other Ambulatory Visit: Payer: Self-pay | Admitting: Medical

## 2019-12-29 ENCOUNTER — Ambulatory Visit: Payer: 59 | Admitting: Gastroenterology

## 2020-01-12 ENCOUNTER — Telehealth: Payer: Self-pay | Admitting: Medical

## 2020-01-12 NOTE — Telephone Encounter (Signed)
Fine

## 2020-01-12 NOTE — Telephone Encounter (Signed)
Ok to transfer. 

## 2020-01-12 NOTE — Telephone Encounter (Signed)
Patient is wanting to be a TOC patient. Please advise.

## 2020-01-18 ENCOUNTER — Encounter: Payer: Self-pay | Admitting: Allergy and Immunology

## 2020-01-19 NOTE — Telephone Encounter (Signed)
Looking back in pts chart she has been on symbicort and qvar! Please advise to change Hello Dr. Lucie Leather, [facility] your Christmas was wonderful and relaxing.   First, I have new [employer], which most likely is better than my old [employer]. Secondly, since I have this newer better insurance and my current inhaler seems to not be working as well has my old inhaler that I was on for years, I was wondering if I could possibly switch back to the old inhaler. I don't remember the name of it, but I know I was on it for years. Could you look that up and send it over to my pharmacy at [facility] on [address]?   Thanks,  Anna Torres

## 2020-01-24 MED ORDER — BUDESONIDE-FORMOTEROL FUMARATE 160-4.5 MCG/ACT IN AERO: 2.0000 | INHALATION_SPRAY | Freq: Two times a day (BID) | RESPIRATORY_TRACT | 5 refills | Status: DC

## 2020-01-27 ENCOUNTER — Ambulatory Visit: Payer: 59 | Admitting: Nurse Practitioner

## 2020-01-27 ENCOUNTER — Other Ambulatory Visit: Payer: Self-pay

## 2020-01-27 ENCOUNTER — Encounter: Payer: Self-pay | Admitting: Nurse Practitioner

## 2020-01-27 VITALS — BP 104/62 | HR 68 | Resp 16 | Ht 62.25 in | Wt 135.0 lb

## 2020-01-27 DIAGNOSIS — Z01419 Encounter for gynecological examination (general) (routine) without abnormal findings: Secondary | ICD-10-CM | POA: Diagnosis not present

## 2020-01-27 DIAGNOSIS — Z3041 Encounter for surveillance of contraceptive pills: Secondary | ICD-10-CM | POA: Diagnosis not present

## 2020-01-27 NOTE — Patient Instructions (Signed)
Health Maintenance, Female Adopting a healthy lifestyle and getting preventive care are important in promoting health and wellness. Ask your health care provider about:  The right schedule for you to have regular tests and exams.  Things you can do on your own to prevent diseases and keep yourself healthy. What should I know about diet, weight, and exercise? Eat a healthy diet   Eat a diet that includes plenty of vegetables, fruits, low-fat dairy products, and lean protein.  Do not eat a lot of foods that are high in solid fats, added sugars, or sodium. Maintain a healthy weight Body mass index (BMI) is used to identify weight problems. It estimates body fat based on height and weight. Your health care provider can help determine your BMI and help you achieve or maintain a healthy weight. Get regular exercise Get regular exercise. This is one of the most important things you can do for your health. Most adults should:  Exercise for at least 150 minutes each week. The exercise should increase your heart rate and make you sweat (moderate-intensity exercise).  Do strengthening exercises at least twice a week. This is in addition to the moderate-intensity exercise.  Spend less time sitting. Even light physical activity can be beneficial. Watch cholesterol and blood lipids Have your blood tested for lipids and cholesterol at 49 years of age, then have this test every 5 years. Have your cholesterol levels checked more often if:  Your lipid or cholesterol levels are high.  You are older than 49 years of age.  You are at high risk for heart disease. What should I know about cancer screening? Depending on your health history and family history, you may need to have cancer screening at various ages. This may include screening for:  Breast cancer.  Cervical cancer.  Colorectal cancer.  Skin cancer.  Lung cancer. What should I know about heart disease, diabetes, and high blood  pressure? Blood pressure and heart disease  High blood pressure causes heart disease and increases the risk of stroke. This is more likely to develop in people who have high blood pressure readings, are of African descent, or are overweight.  Have your blood pressure checked: ? Every 3-5 years if you are 18-39 years of age. ? Every year if you are 40 years old or older. Diabetes Have regular diabetes screenings. This checks your fasting blood sugar level. Have the screening done:  Once every three years after age 40 if you are at a normal weight and have a low risk for diabetes.  More often and at a younger age if you are overweight or have a high risk for diabetes. What should I know about preventing infection? Hepatitis B If you have a higher risk for hepatitis B, you should be screened for this virus. Talk with your health care provider to find out if you are at risk for hepatitis B infection. Hepatitis C Testing is recommended for:  Everyone born from 1945 through 1965.  Anyone with known risk factors for hepatitis C. Sexually transmitted infections (STIs)  Get screened for STIs, including gonorrhea and chlamydia, if: ? You are sexually active and are younger than 49 years of age. ? You are older than 49 years of age and your health care provider tells you that you are at risk for this type of infection. ? Your sexual activity has changed since you were last screened, and you are at increased risk for chlamydia or gonorrhea. Ask your health care provider if   you are at risk.  Ask your health care provider about whether you are at high risk for HIV. Your health care provider may recommend a prescription medicine to help prevent HIV infection. If you choose to take medicine to prevent HIV, you should first get tested for HIV. You should then be tested every 3 months for as long as you are taking the medicine. Pregnancy  If you are about to stop having your period (premenopausal) and  you may become pregnant, seek counseling before you get pregnant.  Take 400 to 800 micrograms (mcg) of folic acid every day if you become pregnant.  Ask for birth control (contraception) if you want to prevent pregnancy. Osteoporosis and menopause Osteoporosis is a disease in which the bones lose minerals and strength with aging. This can result in bone fractures. If you are 65 years old or older, or if you are at risk for osteoporosis and fractures, ask your health care provider if you should:  Be screened for bone loss.  Take a calcium or vitamin D supplement to lower your risk of fractures.  Be given hormone replacement therapy (HRT) to treat symptoms of menopause. Follow these instructions at home: Lifestyle  Do not use any products that contain nicotine or tobacco, such as cigarettes, e-cigarettes, and chewing tobacco. If you need help quitting, ask your health care provider.  Do not use street drugs.  Do not share needles.  Ask your health care provider for help if you need support or information about quitting drugs. Alcohol use  Do not drink alcohol if: ? Your health care provider tells you not to drink. ? You are pregnant, may be pregnant, or are planning to become pregnant.  If you drink alcohol: ? Limit how much you use to 0-1 drink a day. ? Limit intake if you are breastfeeding.  Be aware of how much alcohol is in your drink. In the [place] one drink equals one 12 oz bottle of beer (355 mL), one 5 oz glass of wine (148 mL), or one 1 oz glass of hard liquor (44 mL). General instructions  Schedule regular health, dental, and eye exams.  Stay current with your vaccines.  Tell your health care provider if: ? You often feel depressed. ? You have ever been abused or do not feel safe at home. Summary  Adopting a healthy lifestyle and getting preventive care are important in promoting health and wellness.  Follow your health care provider's instructions about healthy  diet, exercising, and getting tested or screened for diseases.  Follow your health care provider's instructions on monitoring your cholesterol and blood pressure. This information is not intended to replace advice given to you by your health care provider. Make sure you discuss any questions you have with your health care provider. Document Revised: 12/31/2017 Document Reviewed: 12/31/2017 Elsevier Patient Education  2020 [employer] Inc[employer]  

## 2020-01-27 NOTE — Progress Notes (Signed)
   Anna Torres May 30, 1970 [id number]   History:  50 y.o. G0 presents for annual exam. 2014 benign endometrial polyp. OCPs continuously with withdrawal bleed every 3 months. Denies menopausal symptoms. Normal pap history. Sexually active with boyfriend.   Gynecologic History Patient's last menstrual period was 01/18/2020.   Contraception/Family planning: OCP (estrogen/progesterone)  Health Maintenance Last Pap: 04/02/2018. Results were: normal Last mammogram: 2016. Results were: normal  Past medical history, past surgical history, family history and social history were all reviewed and documented in the EPIC chart.  ROS:  A ROS was performed and pertinent positives and negatives are included.  Exam:  Vitals:   01/27/20 1526  BP: 104/62  Pulse: 68  Resp: 16  Weight: 135 lb (61.2 kg)  Height: 5' 2.25" (1.581 m)   Body mass index is 24.49 kg/m.  General appearance:  Normal Thyroid:  Symmetrical, normal in size, without palpable masses or nodularity. Respiratory  Auscultation:  Clear without wheezing or rhonchi Cardiovascular  Auscultation:  Regular rate, without rubs, murmurs or gallops  Edema/varicosities:  Not grossly evident Abdominal  Soft,nontender, without masses, guarding or rebound.  Liver/spleen:  No organomegaly noted  Hernia:  None appreciated  Skin  Inspection:  Grossly normal   Breasts: Examined lying and sitting.   Right: Without masses, retractions, discharge or axillary adenopathy.   Left: Without masses, retractions, discharge or axillary adenopathy. Gentitourinary   Inguinal/mons:  Normal without inguinal adenopathy  External genitalia:  Normal  BUS/Urethra/Skene's glands:  Normal  Vagina:  Normal  Cervix:  Normal  Uterus:  Normal in size, shape and contour.  Midline and mobile  Adnexa/parametria:     Rt: Without masses or tenderness.   Lt: Without masses or tenderness.  Anus and perineum: Normal  Digital rectal exam: Normal sphincter  tone without palpated masses or tenderness  Assessment/Plan:  50 y.o. G0 for annual exam.   Well female exam with routine gynecological exam - Education provided on SBEs, importance of preventative screenings, current guidelines, high calcium diet, regular exercise, and multivitamin daily. Labs with PCP.   Encounter for surveillance of contraceptive pills - takes continuously with withdrawal bleed every 3 months. Will provide refills as needed. She was provided with 1 years supply by PCP.  Screening for cervical cancer - Normal Pap history.  Will repeat at 5-year interval per guidelines.  Screening for breast cancer - Normal mammogram history although she is overdue.  Discussed current guidelines and importance of preventative screenings. [facility] on [facility]. Normal breast exam today.  Screening for colon cancer - Screening colonoscopy beginning at age 39.  Follow up in 1 year for annual.    Olivia Mackie [facility], 3:49 PM 01/27/2020

## 2020-02-08 ENCOUNTER — Ambulatory Visit: Payer: 59 | Admitting: Internal Medicine

## 2020-02-08 ENCOUNTER — Other Ambulatory Visit: Payer: Self-pay

## 2020-02-08 ENCOUNTER — Encounter: Payer: Self-pay | Admitting: Internal Medicine

## 2020-02-08 DIAGNOSIS — J4520 Mild intermittent asthma, uncomplicated: Secondary | ICD-10-CM | POA: Diagnosis not present

## 2020-02-08 DIAGNOSIS — M0609 Rheumatoid arthritis without rheumatoid factor, multiple sites: Secondary | ICD-10-CM | POA: Diagnosis not present

## 2020-02-08 DIAGNOSIS — Z3041 Encounter for surveillance of contraceptive pills: Secondary | ICD-10-CM | POA: Diagnosis not present

## 2020-02-08 DIAGNOSIS — K219 Gastro-esophageal reflux disease without esophagitis: Secondary | ICD-10-CM

## 2020-02-08 MED ORDER — BUDESONIDE-FORMOTEROL FUMARATE 160-4.5 MCG/ACT IN AERO
2.0000 | INHALATION_SPRAY | Freq: Two times a day (BID) | RESPIRATORY_TRACT | 3 refills | Status: DC
Start: 2020-02-08 — End: 2021-03-30

## 2020-02-08 MED ORDER — ACYCLOVIR 400 MG PO TABS: 400.0000 mg | ORAL_TABLET | Freq: Two times a day (BID) | ORAL | 3 refills | Status: DC

## 2020-02-08 MED ORDER — DEXLANSOPRAZOLE 60 MG PO CPDR: 60.0000 mg | DELAYED_RELEASE_CAPSULE | Freq: Every day | ORAL | 3 refills | Status: DC

## 2020-02-08 MED ORDER — MONTELUKAST SODIUM 10 MG PO TABS: ORAL_TABLET | ORAL | 3 refills | Status: DC

## 2020-02-08 MED ORDER — NORGESTIMATE-ETH ESTRADIOL 0.25-35 MG-MCG PO TABS: 1.0000 | ORAL_TABLET | Freq: Every day | ORAL | 3 refills | Status: DC

## 2020-02-08 NOTE — Progress Notes (Signed)
   Subjective:   Patient ID: Anna Torres, female    DOB: 10/25/1970, 50 y.o.   MRN: [id number]  HPI The patient is a 50 YO female coming in for continuation of care of her asthma (uses singulair for allergies and symbicort, does see allergy and asthma and no flares in a long time, would like to see them prn now that she is under good control, willing to see them again if needed) and GERD (taking dexilant for many years, this does help her asthma to stay under control, she does have to be careful about tomato sauces the quantity of them) and birth control (started about a decade ago for irregular periods, denies current symptoms, does continuous birth control, seeing ob/gyn and up to date on pap smear, does not think she is in menopause yet).   PMH, [facility], social history reviewed and updated  Review of Systems  Constitutional: Negative.   HENT: Negative.   Eyes: Negative.   Respiratory: Negative for cough, chest tightness and shortness of breath.   Cardiovascular: Negative for chest pain, palpitations and leg swelling.  Gastrointestinal: Negative for abdominal distention, abdominal pain, constipation, diarrhea, nausea and vomiting.  Musculoskeletal: Negative.   Skin: Negative.   Neurological: Negative.   Psychiatric/Behavioral: Negative.     Objective:  Physical Exam Constitutional:      Appearance: She is well-developed and well-nourished.  HENT:     Head: Normocephalic and atraumatic.  Eyes:     Extraocular Movements: EOM normal.  Cardiovascular:     Rate and Rhythm: Normal rate and regular rhythm.  Pulmonary:     Effort: Pulmonary effort is normal. No respiratory distress.     Breath sounds: Normal breath sounds. No wheezing or rales.  Abdominal:     General: Bowel sounds are normal. There is no distension.     Palpations: Abdomen is soft.     Tenderness: There is no abdominal tenderness. There is no rebound.  Musculoskeletal:        General: No edema.     Cervical  back: Normal range of motion.  Skin:    General: Skin is warm and dry.  Neurological:     Mental Status: She is alert and oriented to person, place, and time.     Coordination: Coordination normal.  Psychiatric:        Mood and Affect: Mood and affect normal.    Vitals:   02/08/20 1503  BP: 118/80  Pulse: 91  Resp: 18  Temp: 98.9 F (37.2 C)  TempSrc: Oral  SpO2: 97%  Weight: 132 lb (59.9 kg)  Height: 5' 2.25" (1.581 m)   This visit occurred during the SARS-CoV-2 public health emergency.  Safety protocols were in place, including screening questions prior to the visit, additional usage of staff PPE, and extensive cleaning of exam room while observing appropriate contact time as indicated for disinfecting solutions.   Assessment & Plan:

## 2020-02-08 NOTE — Patient Instructions (Signed)
We have sent in the refill today.   Health Maintenance, Female Adopting a healthy lifestyle and getting preventive care are important in promoting health and wellness. Ask your health care provider about:  The right schedule for you to have regular tests and exams.  Things you can do on your own to prevent diseases and keep yourself healthy. What should I know about diet, weight, and exercise? Eat a healthy diet  Eat a diet that includes plenty of vegetables, fruits, low-fat dairy products, and lean protein.  Do not eat a lot of foods that are high in solid fats, added sugars, or sodium.   Maintain a healthy weight Body mass index (BMI) is used to identify weight problems. It estimates body fat based on height and weight. Your health care provider can help determine your BMI and help you achieve or maintain a healthy weight. Get regular exercise Get regular exercise. This is one of the most important things you can do for your health. Most adults should:  Exercise for at least 150 minutes each week. The exercise should increase your heart rate and make you sweat (moderate-intensity exercise).  Do strengthening exercises at least twice a week. This is in addition to the moderate-intensity exercise.  Spend less time sitting. Even light physical activity can be beneficial. Watch cholesterol and blood lipids Have your blood tested for lipids and cholesterol at 49 years of age, then have this test every 5 years. Have your cholesterol levels checked more often if:  Your lipid or cholesterol levels are high.  You are older than 49 years of age.  You are at high risk for heart disease. What should I know about cancer screening? Depending on your health history and family history, you may need to have cancer screening at various ages. This may include screening for:  Breast cancer.  Cervical cancer.  Colorectal cancer.  Skin cancer.  Lung cancer. What should I know about heart  disease, diabetes, and high blood pressure? Blood pressure and heart disease  High blood pressure causes heart disease and increases the risk of stroke. This is more likely to develop in people who have high blood pressure readings, are of African descent, or are overweight.  Have your blood pressure checked: ? Every 3-5 years if you are 63-9 years of age. ? Every year if you are 51 years old or older. Diabetes Have regular diabetes screenings. This checks your fasting blood sugar level. Have the screening done:  Once every three years after age 35 if you are at a normal weight and have a low risk for diabetes.  More often and at a younger age if you are overweight or have a high risk for diabetes. What should I know about preventing infection? Hepatitis B If you have a higher risk for hepatitis B, you should be screened for this virus. Talk with your health care provider to find out if you are at risk for hepatitis B infection. Hepatitis C Testing is recommended for:  Everyone born from [birth year] through 1965.  Anyone with known risk factors for hepatitis C. Sexually transmitted infections (STIs)  Get screened for STIs, including gonorrhea and chlamydia, if: ? You are sexually active and are younger than 49 years of age. ? You are older than 49 years of age and your health care provider tells you that you are at risk for this type of infection. ? Your sexual activity has changed since you were last screened, and you are at increased risk  for chlamydia or gonorrhea. Ask your health care provider if you are at risk.  Ask your health care provider about whether you are at high risk for HIV. Your health care provider may recommend a prescription medicine to help prevent HIV infection. If you choose to take medicine to prevent HIV, you should first get tested for HIV. You should then be tested every 3 months for as long as you are taking the medicine. Pregnancy  If you are about to stop  having your period (premenopausal) and you may become pregnant, seek counseling before you get pregnant.  Take 400 to 800 micrograms (mcg) of folic acid every day if you become pregnant.  Ask for birth control (contraception) if you want to prevent pregnancy. Osteoporosis and menopause Osteoporosis is a disease in which the bones lose minerals and strength with aging. This can result in bone fractures. If you are 71 years old or older, or if you are at risk for osteoporosis and fractures, ask your health care provider if you should:  Be screened for bone loss.  Take a calcium or vitamin D supplement to lower your risk of fractures.  Be given hormone replacement therapy (HRT) to treat symptoms of menopause. Follow these instructions at home: Lifestyle  Do not use any products that contain nicotine or tobacco, such as cigarettes, e-cigarettes, and chewing tobacco. If you need help quitting, ask your health care provider.  Do not use street drugs.  Do not share needles.  Ask your health care provider for help if you need support or information about quitting drugs. Alcohol use  Do not drink alcohol if: ? Your health care provider tells you not to drink. ? You are pregnant, may be pregnant, or are planning to become pregnant.  If you drink alcohol: ? Limit how much you use to 0-1 drink a day. ? Limit intake if you are breastfeeding.  Be aware of how much alcohol is in your drink. In the [place] one drink equals one 12 oz bottle of beer (355 mL), one 5 oz glass of wine (148 mL), or one 1 oz glass of hard liquor (44 mL). General instructions  Schedule regular health, dental, and eye exams.  Stay current with your vaccines.  Tell your health care provider if: ? You often feel depressed. ? You have ever been abused or do not feel safe at home. Summary  Adopting a healthy lifestyle and getting preventive care are important in promoting health and wellness.  Follow your health  care provider's instructions about healthy diet, exercising, and getting tested or screened for diseases.  Follow your health care provider's instructions on monitoring your cholesterol and blood pressure. This information is not intended to replace advice given to you by your health care provider. Make sure you discuss any questions you have with your health care provider. Document Revised: 12/31/2017 Document Reviewed: 12/31/2017 Elsevier Patient Education  2021 [employer].

## 2020-02-09 ENCOUNTER — Other Ambulatory Visit: Payer: Self-pay | Admitting: Internal Medicine

## 2020-02-09 DIAGNOSIS — M069 Rheumatoid arthritis, unspecified: Secondary | ICD-10-CM | POA: Insufficient documentation

## 2020-02-09 DIAGNOSIS — K219 Gastro-esophageal reflux disease without esophagitis: Secondary | ICD-10-CM | POA: Insufficient documentation

## 2020-02-09 NOTE — Assessment & Plan Note (Signed)
Refill sprintec for 1 year. Should continue with follow up ob/gyn and consider D/C in the next few years as she may be going into menopause soon.

## 2020-02-09 NOTE — Assessment & Plan Note (Signed)
Overall well controlled, no recent flares. Taking singulair and symbicort.

## 2020-02-09 NOTE — Assessment & Plan Note (Signed)
Taking dexilant for many years. Given asthma has been well controlled would not stop at this time.

## 2020-02-09 NOTE — Assessment & Plan Note (Signed)
Taking humira and methotrexate. Monitoring labs with rheumatology and overall well controlled.

## 2020-02-10 NOTE — Telephone Encounter (Signed)
Medication is not covered. Requesting a alternative

## 2020-02-11 ENCOUNTER — Other Ambulatory Visit: Payer: Self-pay | Admitting: Internal Medicine

## 2020-02-11 NOTE — Telephone Encounter (Signed)
Pharmacy is requesting a alternate medication.

## 2020-02-11 NOTE — Telephone Encounter (Signed)
I don't think patient wants to change but you can call her and ask

## 2020-02-14 ENCOUNTER — Other Ambulatory Visit: Payer: Self-pay | Admitting: Medical

## 2020-02-15 NOTE — Telephone Encounter (Signed)
Needs PA 

## 2020-03-02 ENCOUNTER — Encounter: Payer: Self-pay | Admitting: Internal Medicine

## 2020-03-03 MED ORDER — FENOFIBRATE 48 MG PO TABS: 48.0000 mg | ORAL_TABLET | Freq: Every day | ORAL | 3 refills | Status: DC

## 2020-03-09 ENCOUNTER — Other Ambulatory Visit: Payer: Self-pay

## 2020-03-09 ENCOUNTER — Telehealth: Payer: Self-pay

## 2020-03-09 MED ORDER — DEXLANSOPRAZOLE 60 MG PO CPDR: 60.0000 mg | DELAYED_RELEASE_CAPSULE | Freq: Every day | ORAL | 3 refills | Status: DC

## 2020-03-09 MED ORDER — FENOFIBRATE 48 MG PO TABS: 48.0000 mg | ORAL_TABLET | Freq: Every day | ORAL | 3 refills | Status: DC

## 2020-03-09 NOTE — Telephone Encounter (Signed)
Fenofibrate has been approved through 03/09/2021. Confirmation later has been faxed to the pharmacy. Confirmation fax has been received.

## 2020-03-09 NOTE — Addendum Note (Signed)
Addended by: Manuela Schwartz on: 03/09/2020 12:14 PM   Modules accepted: Orders

## 2020-03-09 NOTE — Addendum Note (Signed)
Addended by: Manuela Schwartz on: 03/09/2020 09:57 AM   Modules accepted: Orders

## 2020-03-09 NOTE — Telephone Encounter (Signed)
PA has been initiated on covermymeds.  KeyFerdinand Cava PA Case ID: [id number]  I will update once a decision has been made.

## 2020-05-28 ENCOUNTER — Encounter: Payer: Self-pay | Admitting: Internal Medicine

## 2020-05-29 ENCOUNTER — Other Ambulatory Visit: Payer: Self-pay

## 2020-05-29 MED ORDER — FENOFIBRATE 48 MG PO TABS: 48.0000 mg | ORAL_TABLET | Freq: Every day | ORAL | 3 refills | Status: DC

## 2020-05-29 MED ORDER — ACYCLOVIR 400 MG PO TABS: 400.0000 mg | ORAL_TABLET | Freq: Two times a day (BID) | ORAL | 3 refills | Status: DC

## 2020-06-13 ENCOUNTER — Encounter: Payer: Self-pay | Admitting: Internal Medicine

## 2020-06-14 MED ORDER — DEXLANSOPRAZOLE 60 MG PO CPDR: 60.0000 mg | DELAYED_RELEASE_CAPSULE | Freq: Every day | ORAL | 3 refills | Status: DC

## 2020-06-28 ENCOUNTER — Encounter: Payer: Self-pay | Admitting: Nurse Practitioner

## 2020-06-29 ENCOUNTER — Other Ambulatory Visit: Payer: Self-pay | Admitting: Nurse Practitioner

## 2020-06-29 DIAGNOSIS — Z3041 Encounter for surveillance of contraceptive pills: Secondary | ICD-10-CM

## 2020-06-29 MED ORDER — NORGESTIMATE-ETH ESTRADIOL 0.25-35 MG-MCG PO TABS: 1.0000 | ORAL_TABLET | Freq: Every day | ORAL | 2 refills | Status: DC

## 2020-06-29 NOTE — Telephone Encounter (Signed)
Anna Torres her PCP sent her birth control to [facility] in Jan 2022. I was confused about if she was going to fill or you are going to continue to fill for patient.

## 2020-12-17 ENCOUNTER — Encounter: Payer: Self-pay | Admitting: Internal Medicine

## 2020-12-18 NOTE — Telephone Encounter (Signed)
I was unable to find any openings for today with any of our practices. The next available is for tomorrow.

## 2020-12-18 NOTE — Telephone Encounter (Signed)
That's fine

## 2020-12-24 NOTE — Progress Notes (Signed)
Subjective:    Patient ID: Anna Torres, female    DOB: 1970/05/27, 50 y.o.   MRN: [id number]  This visit occurred during the SARS-CoV-2 public health emergency.  Safety protocols were in place, including screening questions prior to the visit, additional usage of staff PPE, and extensive cleaning of exam room while observing appropriate contact time as indicated for disinfecting solutions.    HPI The patient is here for an acute visit.   About 2 weeks ago she started with a rash on her bilateral eyes.  The rash started on the medial aspect of both eyes and then went to both eyelids.  It was dry, scaly and erythematous.  She denied any lesions or blisters.  She also stated it felt like sand was under her eyelid.  She has a history of herpes and thought it was a herpes outbreak.  She started applying erythromycin ointment and also tried cortisone cream.  These did not seem to help much and would cause some burning when she used them.    She did have some conjunctival redness and her eyes were very watery.  She denies using any new products recently.  Her symptoms are much improved and just mild at this point.  At 1 point she was doing some warm compresses.  She was afraid to put anything on them so did not put any lotion or other products on the eyes.     Medications and allergies reviewed with patient and updated if appropriate.  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 180 tablet 3   budesonide-formoterol (SYMBICORT) 160-4.5 MCG/ACT inhaler Inhale 2 puffs into the lungs 2 (two) times daily. 3 each 3   dexlansoprazole (DEXILANT) 60 MG capsule Take 1 capsule (60 mg total) by mouth daily. 90 capsule 3   fenofibrate (TRICOR) 48 MG tablet Take 1 tablet (48 mg total) by mouth daily. 90 tablet 3   folic acid (FOLVITE) 1 MG tablet Take 1 mg by mouth daily.     HUMIRA PEN 40 MG/0.4ML PNKT SMARTSIG:40 Milligram(s) SUB-Q Every 2 Weeks     methotrexate (RHEUMATREX) 2.5 MG tablet Take 20 mg by mouth once a week.     montelukast (SINGULAIR) 10 MG tablet TAKE 1 TABLET BY MOUTH EVERYDAY AT BEDTIME 90 tablet 3   norgestimate-ethinyl estradiol (SPRINTEC 28) 0.25-35 MG-MCG tablet Take 1 tablet by mouth daily. 112 tablet 2   No current facility-administered medications on file prior to visit.    Past Medical History:  Diagnosis Date   Asthma    Asthma    last episode january 2015   CIN I (cervical intraepithelial neoplasia I)    Environmental and seasonal allergies    GERD (gastroesophageal reflux disease)    Headache  Herpes genitalis in women    Ureteral reflux 1975   Vitamin D deficiency     Past Surgical History:  Procedure Laterality Date   9 HOUR PH STUDY N/A 09/18/2015   Procedure: 24 HOUR PH STUDY;  Surgeon: Willis Modena, MD;  Location: [facility];  Service: Endoscopy;  Laterality: N/A;   ANTERIOR CERVICAL DECOMP/DISCECTOMY FUSION N/A 01/31/2014   Procedure: ANTERIOR CERVICAL DECOMPRESSION/DISCECTOMY FUSION CERVICAL FIVE-SIX,CERVICAL SIX-SEVEN WITH HARDWARE REMOVAL OF CODMAN PLATE AT CERVIAL FOUR-FIVE;  Surgeon: Mariam Dollar, MD;  Location: [facility];  Service: Neurosurgery;  Laterality: N/A;    BACK SURGERY Bilateral 2001   C4-C5 DISK "fusion"   CERVICAL SPINE SURGERY     DILATATION & CURETTAGE/HYSTEROSCOPY WITH TRUECLEAR     3'14-endometrial polyp-[facility]   DILATION AND CURETTAGE OF UTERUS     ESOPHAGEAL MANOMETRY N/A 05/24/2013   Procedure: ESOPHAGEAL MANOMETRY (EM);  Surgeon: Willis Modena, MD;  Location: [facility];  Service: Endoscopy;  Laterality: N/A;   ESOPHAGOGASTRODUODENOSCOPY     ESOPHAGOGASTRODUODENOSCOPY (EGD) WITH PROPOFOL N/A 05/24/2013   Procedure: ESOPHAGOGASTRODUODENOSCOPY (EGD) WITH PROPOFOL;  Surgeon: Willis Modena, MD;  Location: [facility];  Service: Endoscopy;  Laterality: N/A;   EXTERNAL EAR SURGERY     bilateral "pinning" age 78   MANDIBLE FRACTURE SURGERY  1990   retained hardware "wires"   MANDIBLE SURGERY      Social History   Socioeconomic History   Marital status: Divorced    Spouse name: Not on file   Number of children: Not on file   Years of education: Not on file   Highest education level: Not on file  Occupational History   Not on file  Tobacco Use   Smoking status: Never   Smokeless tobacco: Never  Substance and Sexual Activity   Alcohol use: Not Currently    Alcohol/week: 0.0 standard drinks   Drug use: No   Sexual activity: Not Currently    Birth control/protection: Pill  Other Topics Concern   Not on file  Social History Narrative   ** Merged History Encounter **       Social Determinants of Health   Financial Resource Strain: Not on file  Food Insecurity: Not on file  Transportation Needs: Not on file  Physical Activity: Not on file  Stress: Not on file  Social Connections: Not on file    Family History  Problem Relation Age of Onset   Diabetes Mother    Rheum arthritis Mother    Arthritis Mother    Cancer Mother    Luiz Blare' disease Sister     Review of Systems  Constitutional:  Negative for fever.  HENT:  Positive for congestion and rhinorrhea.   Eyes:  Positive for itching. Negative for photophobia,  pain, redness and visual disturbance.      Objective:   Vitals:   12/25/20 0810  BP: 108/78  Pulse: (!) 101  Temp: 98 F (36.7 C)  SpO2: 96%   BP Readings from Last 3 Encounters:  12/25/20 108/78  02/08/20 118/80  01/27/20 104/62   Wt Readings from Last 3 Encounters:  12/25/20 138 lb (62.6 kg)  02/08/20 132 lb (59.9 kg)  01/27/20 135 lb (61.2 kg)   Body mass index is 25.04 kg/m.   Physical Exam Constitutional:      General: She is not in acute distress.    Appearance: Normal appearance. She is not ill-appearing.  Eyes:     General: No scleral icterus.       Right  eye: No discharge.        Left eye: No discharge.     Extraocular Movements: Extraocular movements intact.     Conjunctiva/sclera: Conjunctivae normal.  Skin:    General: Skin is warm and dry.     Findings: No erythema, lesion or rash.  Neurological:     Mental Status: She is alert.           Assessment & Plan:    Eyelid dermatitis-allergic or contact: Acute Started 2 weeks ago and has improved at this point Unknown cause, but advised I do not think this was herpes since her symptoms are improved and her exam is fairly normal I recommended just using a mild face moisturizer Should resolve completely-if it does not she will let me know

## 2020-12-25 ENCOUNTER — Encounter: Payer: Self-pay | Admitting: Internal Medicine

## 2020-12-25 ENCOUNTER — Other Ambulatory Visit: Payer: Self-pay

## 2020-12-25 ENCOUNTER — Ambulatory Visit: Payer: 59 | Admitting: Internal Medicine

## 2020-12-25 VITALS — BP 108/78 | HR 101 | Temp 98.0°F | Ht 62.25 in | Wt 138.0 lb

## 2020-12-25 DIAGNOSIS — H01119 Allergic dermatitis of unspecified eye, unspecified eyelid: Secondary | ICD-10-CM

## 2020-12-25 NOTE — Patient Instructions (Addendum)
Try using a gentle moisturizer and if your symptoms do not resolve completely let us know.      Contact Dermatitis Dermatitis is redness, soreness, and swelling (inflammation) of the skin. Contact dermatitis is a reaction to certain substances that touch the skin. Many different substances can cause contact dermatitis. There are two types of contact dermatitis: Irritant contact dermatitis. This type is caused by something that irritates your skin, such as having dry hands from washing them too often with soap. This type does not require previous exposure to the substance for a reaction to occur. This is the most common type. Allergic contact dermatitis. This type is caused by a substance that you are allergic to, such as poison ivy. This type occurs when you have been exposed to the substance (allergen) and develop a sensitivity to it. Dermatitis may develop soon after your first exposure to the allergen, or it may not develop until the next time you are exposed and every time thereafter. What are the causes? Irritant contact dermatitis is most commonly caused by exposure to: Makeup. Soaps. Detergents. Bleaches. Acids. Metal salts, such as nickel. Allergic contact dermatitis is most commonly caused by exposure to: Poisonous plants. Chemicals. Jewelry. Latex. Medicines. Preservatives in products, such as clothing. What increases the risk? You are more likely to develop this condition if you have: A job that exposes you to irritants or allergens. Certain medical conditions, such as asthma or eczema. What are the signs or symptoms? Symptoms of this condition may occur on your body anywhere the irritant has touched you or is touched by you. Symptoms include: Dryness or flaking. Redness. Cracks. Itching. Pain or a burning feeling. Blisters. Drainage of small amounts of blood or clear fluid from skin cracks. With allergic contact dermatitis, there may also be swelling in areas  such as the eyelids, mouth, or genitals. How is this diagnosed? This condition is diagnosed with a medical history and physical exam. A patch skin test may be performed to help determine the cause. If the condition is related to your job, you may need to see an occupational medicine specialist. How is this treated? This condition is treated by checking for the cause of the reaction and protecting your skin from further contact. Treatment may also include: Steroid creams or ointments. Oral steroid medicines may be needed in more severe cases. Antibiotic medicines or antibacterial ointments, if a skin infection is present. Antihistamine lotion or an antihistamine taken by mouth to ease itching. A bandage (dressing). Follow these instructions at home: Skin care Moisturize your skin as needed. Apply cool compresses to the affected areas. Try applying baking soda paste to your skin. Stir water into baking soda until it reaches a paste-like consistency. Do not scratch your skin, and avoid friction to the affected area. Avoid the use of soaps, perfumes, and dyes. Medicines Take or apply over-the-counter and prescription medicines only as told by your health care provider. If you were prescribed an antibiotic medicine, take or apply the antibiotic as told by your health care provider. Do not stop using the antibiotic even if your condition improves. Bathing Try taking a bath with: Epsom salts. Follow the instructions on the packaging. You can get these at your local pharmacy or grocery store. Baking soda. Pour a small amount into the bath as directed by your health care provider. Colloidal oatmeal. Follow the instructions on the packaging. You can get this at your local pharmacy or grocery store. Bathe less frequently, such as every  other day. Bathe in lukewarm water. Avoid using hot water. Bandage care If you were given a bandage (dressing), change it as told by your health care provider. Wash  your hands with soap and water before and after you change your dressing. If soap and water are not available, use hand sanitizer. General instructions Avoid the substance that caused your reaction. If you do not know what caused it, keep a journal to try to track what caused it. Write down: What you eat. What cosmetic products you use. What you drink. What you wear in the affected area. This includes jewelry. Check the affected areas every day for signs of infection. Check for: More redness, swelling, or pain. More fluid or blood. Warmth. Pus or a bad smell. Keep all follow-up visits as told by your health care provider. This is important. Contact a health care provider if: Your condition does not improve with treatment. Your condition gets worse. You have signs of infection such as swelling, tenderness, redness, soreness, or warmth in the affected area. You have a fever. You have new symptoms. Get help right away if: You have a severe headache, neck pain, or neck stiffness. You vomit. You feel very sleepy. You notice red streaks coming from the affected area. Your bone or joint underneath the affected area becomes painful after the skin has healed. The affected area turns darker. You have difficulty breathing. Summary Dermatitis is redness, soreness, and swelling (inflammation) of the skin. Contact dermatitis is a reaction to certain substances that touch the skin. Symptoms of this condition may occur on your body anywhere the irritant has touched you or is touched by you. This condition is treated by figuring out what caused the reaction and protecting your skin from further contact. Treatment may also include medicines and skin care. Avoid the substance that caused your reaction. If you do not know what caused it, keep a journal to try to track what caused it. Contact a health care provider if your condition gets worse or you have signs of infection such as swelling, tenderness,  redness, soreness, or warmth in the affected area. This information is not intended to replace advice given to you by your health care provider. Make sure you discuss any questions you have with your health care provider. Document Revised: 04/29/2018 Document Reviewed: 07/23/2017 Elsevier Patient Education  2022 [employer].

## 2021-01-29 ENCOUNTER — Ambulatory Visit: Payer: 59 | Admitting: Nurse Practitioner

## 2021-02-09 ENCOUNTER — Ambulatory Visit: Payer: 59 | Admitting: Nurse Practitioner

## 2021-02-09 ENCOUNTER — Encounter: Payer: 59 | Admitting: Internal Medicine

## 2021-02-12 ENCOUNTER — Other Ambulatory Visit: Payer: Self-pay

## 2021-02-12 ENCOUNTER — Encounter: Payer: Self-pay | Admitting: Nurse Practitioner

## 2021-02-12 ENCOUNTER — Ambulatory Visit (INDEPENDENT_AMBULATORY_CARE_PROVIDER_SITE_OTHER): Payer: 59 | Admitting: Nurse Practitioner

## 2021-02-12 ENCOUNTER — Ambulatory Visit (INDEPENDENT_AMBULATORY_CARE_PROVIDER_SITE_OTHER): Payer: 59 | Admitting: Internal Medicine

## 2021-02-12 ENCOUNTER — Encounter: Payer: Self-pay | Admitting: Internal Medicine

## 2021-02-12 VITALS — BP 118/90 | HR 114 | Resp 18 | Ht 62.2 in | Wt 138.4 lb

## 2021-02-12 VITALS — BP 124/84 | Ht 62.0 in | Wt 137.0 lb

## 2021-02-12 DIAGNOSIS — Z0001 Encounter for general adult medical examination with abnormal findings: Secondary | ICD-10-CM | POA: Diagnosis not present

## 2021-02-12 DIAGNOSIS — R0789 Other chest pain: Secondary | ICD-10-CM | POA: Insufficient documentation

## 2021-02-12 DIAGNOSIS — E781 Pure hyperglyceridemia: Secondary | ICD-10-CM

## 2021-02-12 DIAGNOSIS — Z01419 Encounter for gynecological examination (general) (routine) without abnormal findings: Secondary | ICD-10-CM

## 2021-02-12 DIAGNOSIS — R051 Acute cough: Secondary | ICD-10-CM | POA: Diagnosis not present

## 2021-02-12 DIAGNOSIS — K219 Gastro-esophageal reflux disease without esophagitis: Secondary | ICD-10-CM

## 2021-02-12 DIAGNOSIS — Z3041 Encounter for surveillance of contraceptive pills: Secondary | ICD-10-CM

## 2021-02-12 DIAGNOSIS — M0609 Rheumatoid arthritis without rheumatoid factor, multiple sites: Secondary | ICD-10-CM | POA: Diagnosis not present

## 2021-02-12 DIAGNOSIS — J4521 Mild intermittent asthma with (acute) exacerbation: Secondary | ICD-10-CM

## 2021-02-12 LAB — POC COVID19 BINAXNOW: SARS Coronavirus 2 Ag: NEGATIVE

## 2021-02-12 MED ORDER — NORGESTIMATE-ETH ESTRADIOL 0.25-35 MG-MCG PO TABS: 1.0000 | ORAL_TABLET | Freq: Every day | ORAL | 3 refills | Status: DC

## 2021-02-12 NOTE — Assessment & Plan Note (Signed)
Flu shot up to date. Covid-19 booster counseled. Tetanus up to date. Colonoscopy counseled and declines, loose BM so cannot do cologuard currently. Mammogram up to date with gyn, pap smear up to date with gyn. Counseled about sun safety and mole surveillance. Counseled about the dangers of distracted driving. Given 10 year screening recommendations.

## 2021-02-12 NOTE — Progress Notes (Signed)
° °  Subjective:   Patient ID: Anna Torres, female    DOB: 1970/03/19, 51 y.o.   MRN: [id number]  HPI The patient is a 51 YO female coming in for physical. Also needs acute visit sore throat and cough and elephant sitting on chest since Friday.  PMH, [facility], social history reviewed and updated  Review of Systems  Constitutional: Negative.  Negative for activity change, appetite change, chills, fatigue, fever and unexpected weight change.  HENT:  Positive for congestion. Negative for ear discharge, ear pain, sinus pain, sneezing, sore throat, tinnitus, trouble swallowing and voice change.   Eyes: Negative.   Respiratory:  Positive for cough and chest tightness. Negative for shortness of breath and wheezing.   Cardiovascular: Negative.  Negative for chest pain, palpitations and leg swelling.  Gastrointestinal:  Positive for diarrhea. Negative for abdominal distention, abdominal pain, constipation, nausea and vomiting.  Musculoskeletal: Negative.   Skin: Negative.   Neurological: Negative.   Psychiatric/Behavioral: Negative.     Objective:  Physical Exam Constitutional:      Appearance: She is well-developed.  HENT:     Head: Normocephalic and atraumatic.     Comments: Oropharynx with redness and clear drainage, nose with swollen turbinates, TMs normal bilaterally.  Neck:     Thyroid: No thyromegaly.  Cardiovascular:     Rate and Rhythm: Normal rate and regular rhythm.  Pulmonary:     Effort: Pulmonary effort is normal. No respiratory distress.     Breath sounds: Normal breath sounds. No wheezing or rales.  Abdominal:     General: Bowel sounds are normal. There is no distension.     Palpations: Abdomen is soft.     Tenderness: There is no abdominal tenderness. There is no rebound.  Musculoskeletal:        General: Tenderness present.     Cervical back: Normal range of motion.  Lymphadenopathy:     Cervical: No cervical adenopathy.  Skin:    General: Skin is warm and dry.   Neurological:     Mental Status: She is alert and oriented to person, place, and time.     Coordination: Coordination normal.    Vitals:   02/12/21 0759  BP: 118/90  Pulse: (!) 114  Resp: 18  SpO2: 97%  Weight: 138 lb 6.4 oz (62.8 kg)  Height: 5' 2.2" (1.58 m)   EKG: Rate 101, axis normal, interval normal, sinus tachy, no st or t wave changes, no significant change compared to prior 2016  This visit occurred during the SARS-CoV-2 public health emergency.  Safety protocols were in place, including screening questions prior to the visit, additional usage of staff PPE, and extensive cleaning of exam room while observing appropriate contact time as indicated for disinfecting solutions.   Assessment & Plan:

## 2021-02-12 NOTE — Assessment & Plan Note (Signed)
Taking methotrexate and humira and seeing rheumatology today. She would like to try to wean off methotrexate as it causes GI issues.

## 2021-02-12 NOTE — Assessment & Plan Note (Signed)
Taking dexilant 60 mg daily. Will continue.

## 2021-02-12 NOTE — Patient Instructions (Addendum)
I would recommend to get the covid-19 booster shot. Your EKG today was normal and the covid-19 test was negative.   Make sure to take the symbicort twice a day for the next 3-4 days.  Let us know if you can do the cologuard if you are having more consistent bowel movements.

## 2021-02-12 NOTE — Assessment & Plan Note (Signed)
Covid-19 test done in office rapid which was negative. She is advised to double up on symbicort to 2 puffs BID (is taking daily currently). Continue singulair and nasal spray as well which is helping with symptoms. EKG done due to chest tightness which is normal.

## 2021-02-12 NOTE — Assessment & Plan Note (Signed)
With acute viral URI today and advised to take symbicort as prescribed 2 puffs BID instead of daily which had been controlling symptoms previously.

## 2021-02-12 NOTE — Assessment & Plan Note (Signed)
EKG done as new "elephant on chest" sensation for 3 days. She does have RA which is a risk factor for earlier CAD. EKG not changed from before. Have asked her to take symbicort as prescribed 2 puffs BID (has been taking daily for some time).

## 2021-02-12 NOTE — Progress Notes (Signed)
° °  Anna Torres 1970/09/02 [id number]   History:  51 y.o. G0 presents for annual exam. OCPs continuously with withdrawal bleed every 3 months. She has noticed her menses are lighter and she experiences less PMS symptoms. Denies menopausal symptoms. History of CIN 1 years ago. RA, on methotrexate.   Gynecologic History Patient's last menstrual period was 01/22/2021. Period Cycle (Days): 90 Period Duration (Days): 4 Period Pattern: Regular Menstrual Flow: Moderate Dysmenorrhea: (!) Mild Dysmenorrhea Symptoms: Cramping Contraception/Family planning: OCP (estrogen/progesterone) Sexually active: Yes  Health Maintenance Last Pap: 04/02/2018. Results were: Normal, 5-year repeat Last mammogram: 01/05/2015. Results were: Normal Last colonoscopy: Never Last Dexa: Not indicated  Past medical history, past surgical history, family history and social history were all reviewed and documented in the EPIC chart. Married.   ROS:  A ROS was performed and pertinent positives and negatives are included.  Exam:  Vitals:   02/12/21 0907  BP: 124/84  Weight: 137 lb (62.1 kg)  Height: 5\' 2"  (1.575 m)    Body mass index is 25.06 kg/m.  General appearance:  Normal Thyroid:  Symmetrical, normal in size, without palpable masses or nodularity. Respiratory  Auscultation:  Clear without wheezing or rhonchi Cardiovascular  Auscultation:  Regular rate, without rubs, murmurs or gallops  Edema/varicosities:  Not grossly evident Abdominal  Soft,nontender, without masses, guarding or rebound.  Liver/spleen:  No organomegaly noted  Hernia:  None appreciated  Skin  Inspection:  Grossly normal   Breasts: Examined lying and sitting.   Right: Without masses, retractions, discharge or axillary adenopathy.   Left: Without masses, retractions, discharge or axillary adenopathy. Genitourinary   Inguinal/mons:  Normal without inguinal adenopathy  External genitalia:  Normal appearing vulva with no  masses, tenderness, or lesions  BUS/Urethra/Skene's glands:  Normal  Vagina:  Normal appearing with normal color and discharge, no lesions  Cervix:  Normal appearing without discharge or lesions  Uterus:  Normal in size, shape and contour.  Midline and mobile, nontender  Adnexa/parametria:     Rt: Normal in size, without masses or tenderness.   Lt: Normal in size, without masses or tenderness.  Anus and perineum: Normal  Digital rectal exam: Declines  Patient informed chaperone available to be present for breast and pelvic exam. Patient has requested no chaperone to be present. Patient has been advised what will be completed during breast and pelvic exam.   Assessment/Plan:  51 y.o. G0 for annual exam.   Well female exam with routine gynecological exam - Education provided on SBEs, importance of preventative screenings, current guidelines, high calcium diet, regular exercise, and multivitamin daily. Labs with PCP.   Encounter for surveillance of contraceptive pills - Plan: norgestimate-ethinyl estradiol (SPRINTEC 28) 0.25-35 MG-MCG tablet daily. Taking as prescribed. Refill x 1 year provided.   Screening for cervical cancer - CIN 1 years ago. Will repeat pap at 5-year interval per guidelines.  Screening for breast cancer - Normal mammogram history although she is overdue.  Discussed current guidelines and importance of preventative screenings. [facility]. Normal breast exam today.  Screening for colon cancer - Discussed with PCP with plans to start Cologuard next year.  Follow up in 1 year for annual.    Olivia Mackie [facility], 9:28 AM 02/12/2021

## 2021-02-12 NOTE — Assessment & Plan Note (Signed)
No recent lipid panel that we can see. She wishes to combine lab draws and gets labs often with rheumatology (we cannot see their results). She will ask them to check lipid panel and send [place] results. Adjust as needed. On fenofibrate 48 mg daily.

## 2021-02-13 ENCOUNTER — Other Ambulatory Visit: Payer: Self-pay | Admitting: Internal Medicine

## 2021-03-30 ENCOUNTER — Other Ambulatory Visit: Payer: Self-pay | Admitting: Internal Medicine

## 2021-04-25 ENCOUNTER — Ambulatory Visit: Payer: 59 | Admitting: Orthopaedic Surgery

## 2021-04-25 ENCOUNTER — Encounter: Payer: Self-pay | Admitting: Orthopaedic Surgery

## 2021-04-25 DIAGNOSIS — M7582 Other shoulder lesions, left shoulder: Secondary | ICD-10-CM

## 2021-04-25 DIAGNOSIS — M25512 Pain in left shoulder: Secondary | ICD-10-CM

## 2021-04-25 DIAGNOSIS — G8929 Other chronic pain: Secondary | ICD-10-CM | POA: Diagnosis not present

## 2021-04-25 MED ORDER — METHYLPREDNISOLONE ACETATE 40 MG/ML IJ SUSP
40.0000 mg | INTRAMUSCULAR | Status: AC | PRN
  Administered 2021-04-25: 40 mg via INTRA_ARTICULAR

## 2021-04-25 MED ORDER — LIDOCAINE HCL 1 % IJ SOLN
3.0000 mL | INTRAMUSCULAR | Status: AC | PRN
  Administered 2021-04-25: 3 mL

## 2021-04-25 NOTE — Progress Notes (Signed)
? ?Office Visit Note ?  ?Patient: Anna Torres           ?Date of Birth: 05/04/1970           ?MRN: [id number] ?Visit Date: 04/25/2021 ?             ?Requested by: Myrlene Brokerrawford, Elizabeth A, MD ?[address] ?[place] [id number] ?PCP: Myrlene Brokerrawford, Elizabeth A, MD ? ? ?Assessment & Plan: ?Visit Diagnoses:  ?1. Chronic left shoulder pain   ?2. Rotator cuff tendinitis, left   ? ? ?Plan: Based on the MRI findings and her clinical exam findings, I did recommend a steroid injection in her left shoulder subacromial outlet today and she agreed to this and tolerated it well.  She will continue her home exercise program and any medications per her rheumatologist.  I did offer her formal outpatient physical therapy but I think we will defer this since she has good motion and she is working on home exercise program.  I would like to reevaluate her in 6 weeks to see how she is doing overall.  All questions and concerns were answered and addressed. ? ?Follow-Up Instructions: Return in about 6 weeks (around 06/06/2021).  ? ?Orders:  ?Orders Placed This Encounter  ?Procedures  ? Large Joint Inj  ? ?No orders of the defined types were placed in this encounter. ? ? ? ? Procedures: ?Large Joint Inj: L subacromial bursa on 04/25/2021 1:44 PM ?Indications: pain and diagnostic evaluation ?Details: 22 G 1.5 in needle ? ?Arthrogram: No ? ?Medications: 3 mL lidocaine 1 %; 40 mg methylPREDNISolone acetate 40 MG/ML ?Outcome: tolerated well, no immediate complications ?Procedure, treatment alternatives, risks and benefits explained, specific risks discussed. Consent was given by the patient. Immediately prior to procedure a time out was called to verify the correct patient, procedure, equipment, support staff and site/side marked as required. Patient was prepped and draped in the usual sterile fashion.  ? ? ? ? ?Clinical Data: ?No additional findings. ? ? ?Subjective: ?Chief Complaint  ?Patient presents with  ? Left Shoulder - Pain  ?The  patient is a 51 year old who is referred from Dr. Wynetta Emeryram with neurosurgery to evaluate and treat significant left shoulder pain with known moderate tendinosis of the rotator cuff.  There is a MRI of her left shoulder for me to review on canopy.  This is been hurting for several months now.  She is a patient of her rheumatologist as well.  She has had cervical spine fusion type of surgery.  She is never had surgery on her left shoulder.  She denies any specific injury.  It hurts with overhead activities and reaching behind her.  She has been working on home exercise program to make sure her motion does not decrease.  She reports just slight weakness in the shoulder.  Most of her pain seems to be around the subacromial outlet.  She is not a diabetic.  She has not had any type of steroid injection in that shoulder or formal physical therapy. ? ?HPI ? ?Review of Systems ?There is currently listed no fever, chills, nausea, vomiting ? ?Objective: ?Vital Signs: There were no vitals taken for this visit. ? ?Physical Exam ?She is alert and orient x3 and in no acute distress ?Ortho Exam ?Examination of her left shoulder shows full passive motion but active motion is painful past 90 degrees of abduction.  There is no gross tear of the rotator cuff that can be seen on clinical exam.  She  does have some signs of impingement.  Her liftoff is negative. ?Specialty Comments:  ?No specialty comments available. ? ?Imaging: ?No results found. ?The MRI of her left shoulder shows moderate tendinosis of the rotator cuff.  There is no tear. ? ?PMFS History: ?Patient Active Problem List  ? Diagnosis Date Noted  ? Chest tightness 02/12/2021  ? Acute cough 02/12/2021  ? RA (rheumatoid arthritis) (HCC) 02/09/2020  ? GERD (gastroesophageal reflux disease) 02/09/2020  ? Well woman exam with routine gynecological exam 04/02/2018  ? Contraception management 05/29/2016  ? 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 Hypertriglyceridemia 12/25/2015  ? Encounter  for general adult medical examination with abnormal findings 12/11/2015  ? Breakthrough bleeding on OCPs 02/10/2015  ? Chronic headaches 05/31/2014  ? Asthma, chronic 04/26/2014  ? 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 Endometrial polyp 03/30/2012  ? Menorrhagia 03/23/2012  ? CIN I (cervical intraepithelial neoplasia I) 03/23/2012  ? ?Past Medical History:  ?Diagnosis Date  ? Asthma   ? Asthma   ? last episode january 2015  ? CIN I (cervical intraepithelial neoplasia I)   ? Environmental and seasonal allergies   ? GERD (gastroesophageal reflux disease)   ? Headache   ? Herpes genitalis in women   ? Ureteral reflux 1975  ? Vitamin D deficiency   ?  ?Family History  ?Problem Relation Age of Onset  ? Diabetes Mother   ? Rheum arthritis Mother   ? Arthritis Mother   ? Cancer Mother   ? Graves' disease Sister   ?  ?Past Surgical History:  ?Procedure Laterality Date  ? 6 HOUR PH STUDY N/A 09/18/2015  ? Procedure: 24 HOUR PH STUDY;  Surgeon: Willis Modena, MD;  Location: [facility];  Service: Endoscopy;  Laterality: N/A;  ? ANTERIOR CERVICAL DECOMP/DISCECTOMY FUSION N/A 01/31/2014  ? Procedure: ANTERIOR CERVICAL DECOMPRESSION/DISCECTOMY FUSION CERVICAL FIVE-SIX,CERVICAL SIX-SEVEN WITH HARDWARE REMOVAL OF CODMAN PLATE AT CERVIAL FOUR-FIVE;  Surgeon: Mariam Dollar, MD;  Location: [facility] OR;  Service: Neurosurgery;  Laterality: N/A;  ? BACK SURGERY Bilateral 2001  ? C4-C5 DISK "fusion"  ? CERVICAL SPINE SURGERY    ? DILATATION & CURETTAGE/HYSTEROSCOPY WITH TRUECLEAR    ? 3'14-endometrial polyp-Womens  ? DILATION AND CURETTAGE OF UTERUS    ? ESOPHAGEAL MANOMETRY N/A 05/24/2013  ? Procedure: ESOPHAGEAL MANOMETRY (EM);  Surgeon: Willis Modena, MD;  Location: [facility];  Service: Endoscopy;  Laterality: N/A;  ? ESOPHAGOGASTRODUODENOSCOPY    ? ESOPHAGOGASTRODUODENOSCOPY (EGD) WITH PROPOFOL N/A 05/24/2013  ? Procedure: ESOPHAGOGASTRODUODENOSCOPY (EGD) WITH PROPOFOL;  Surgeon: Willis Modena, MD;  Location: [facility];  Service: Endoscopy;  Laterality: N/A;  ? EXTERNAL EAR SURGERY    ? bilateral "pinning" age 73  ? MANDIBLE FRACTURE SURGERY  1990  ? retained hardware "wires"  ? MANDIBLE SURGERY    ? ?Social History  ? ?Occupational History  ? Not on file  ?Tobacco Use  ? Smoking status: Never  ? Smokeless tobacco: Never  ?Substance and Sexual Activity  ? Alcohol use: Not Currently  ?  Alcohol/week: 0.0 standard drinks  ? Drug use: No  ? Sexual activity: Not Currently  ?  Birth control/protection: Pill  ? ? ? ? ? ? ?

## 2021-05-30 ENCOUNTER — Other Ambulatory Visit: Payer: Self-pay | Admitting: Internal Medicine

## 2021-06-01 ENCOUNTER — Other Ambulatory Visit: Payer: Self-pay | Admitting: Internal Medicine

## 2021-06-14 ENCOUNTER — Ambulatory Visit: Payer: 59 | Admitting: Orthopaedic Surgery

## 2021-06-25 ENCOUNTER — Other Ambulatory Visit: Payer: Self-pay | Admitting: Internal Medicine

## 2021-06-28 ENCOUNTER — Encounter: Payer: Self-pay | Admitting: Internal Medicine

## 2021-06-28 ENCOUNTER — Other Ambulatory Visit: Payer: Self-pay | Admitting: Internal Medicine

## 2021-07-24 ENCOUNTER — Other Ambulatory Visit: Payer: Self-pay | Admitting: Internal Medicine

## 2021-07-30 ENCOUNTER — Encounter: Payer: Self-pay | Admitting: Internal Medicine

## 2021-07-31 NOTE — Telephone Encounter (Signed)
Pt is requesting a call back with the response as she only has access to Mychart in the evenings at home.  Please advise

## 2021-08-25 ENCOUNTER — Other Ambulatory Visit: Payer: Self-pay | Admitting: Internal Medicine

## 2021-08-26 ENCOUNTER — Other Ambulatory Visit: Payer: Self-pay | Admitting: Internal Medicine

## 2021-10-17 ENCOUNTER — Encounter: Payer: Self-pay | Admitting: Gastroenterology

## 2021-10-23 ENCOUNTER — Other Ambulatory Visit: Payer: Self-pay | Admitting: Internal Medicine

## 2021-10-29 ENCOUNTER — Other Ambulatory Visit: Payer: Self-pay | Admitting: Internal Medicine

## 2021-11-08 ENCOUNTER — Other Ambulatory Visit: Payer: Self-pay | Admitting: Internal Medicine

## 2021-11-21 ENCOUNTER — Other Ambulatory Visit: Payer: Self-pay | Admitting: Internal Medicine

## 2021-11-28 NOTE — Progress Notes (Unsigned)
Referring Provider: Myrlene Broker, * Primary Care Physician:  Myrlene Broker, MD   Reason for Consultation:  Acid reflux   IMPRESSION:  Reflux now with recurrent symptoms after adequate control with Dexilant x 6 years with atypical chest pain/pressure. Insurance changed and they will not cover Dexilant so she has been paying for it out of pocket.   Small hiatal hernia seen on prior imaging and EGD  No prior colon cancer screening. She refuses colonoscopy and acknowledges the risks of missed polyp or mass. She has previously discussed Cologuard with Dr. Okey Dupre.   Family history of celiac (sister). Anna Torres has not previously been screened for celiac  No known family history of colon cancer or polyps    PLAN: - Start Prevacid 30 mg BID if covered by insurance - Reflux lifestyle modifications - Obtain prior records from Drs. Outlaw and Mann - EGD with esophageal, gastric, and duodenal biopsies - Consider esophageal manometry and 24 hour pH probe after endoscopy - Cologuard   HPI: Anna Torres is a 51 y.o. female referred by Dr. Okey Dupre for acid reflux and colon cancer screening. The history is obtained through the patient, review of her electronic health record, and review of right records from Dr. Loreta Ave. She has a history of asthma, allergies, acid reflux, asthma and rheumatoid arthritis previously on methotrexate. She had spine surgery in 2001 and 2016 (fusion C4-C7 from the right). She is in administration.   She was previously followed by Dr. Dulce Sellar in 2015 and then by Dr. Loreta Ave for many years,  last seen in 2021.  EGD for chest pain, dysphagia, and barium esophagram showing poor peristalsis with Dr. Dulce Sellar 2015 was normal except for a small hiatal hernia. However, review of esophagram shows a small hiatal hernia, moderate gastroesophageal reflux, and normal peristalsis. Esophageal manometry recommended but I am unable to locate the results in  EPIC.  Evaluation with Dr. Loreta Ave for symptomatic gallbladder disease with an abdominal ultrasound and HIDA scan in 2017. Abdominal ultrasound showed fatty liver. No gallstones. HIDA was normal with GBEF of 98%.  [facility] in 2018 for dysphonia. Flexible laryngoscopy showed mild diffuse erythema, modest posterior laryngeal edema, and muscle tension. She feels that those symptoms have improved and she attributes the prior symptoms to a stressful marriage. She is no longer married to that husband.   Has noted breakthrough reflux symptoms despite Dexilant with atypical chest pain occurring after eating a few bites that has recently been worse.  Feels a chest squeezing or a tightness after she eats. Describes this as a pressure not a burning. She finds that these symptoms limit her ability to eat a full meal so she prioritizes highly nutritious foods.  No dysphonia, dysphagia, odynophagia, neck pain, blood in the stool.  Triggers include tomatoes and tomoato sauces.  Dr. Okey Dupre recommended that she increase the Dexilant to BID for 1-2 weeks but this did not provide any relief. She is concerned that the Dexilant is no long working after 7 years. Her insurance change and no longer covers Dexilant. She has been covering it out of pocket.    She has not tolerated methotrexate due to associated bowel changes. She stopped taking it in June 2023 with improvement in vomiting and diarrhea that had been occurring.   Sister has celiac disease. There is no known family history of colon cancer or polyps. No family history of stomach cancer or other GI malignancy. No family history of inflammatory bowel  disease.   No prior colonoscopy.    Past Medical History:  Diagnosis Date   Asthma    Asthma    last episode january 2015   CIN I (cervical intraepithelial neoplasia I)    Environmental and seasonal allergies    GERD (gastroesophageal reflux disease)    Headache    Herpes genitalis in  women    Ureteral reflux 1975   Vitamin D deficiency     Past Surgical History:  Procedure Laterality Date   13 HOUR PH STUDY N/A 09/18/2015   Procedure: 24 HOUR PH STUDY;  Surgeon: Willis Modena, MD;  Location: [facility];  Service: Endoscopy;  Laterality: N/A;   ANTERIOR CERVICAL DECOMP/DISCECTOMY FUSION N/A 01/31/2014   Procedure: ANTERIOR CERVICAL DECOMPRESSION/DISCECTOMY FUSION CERVICAL FIVE-SIX,CERVICAL SIX-SEVEN WITH HARDWARE REMOVAL OF CODMAN PLATE AT CERVIAL FOUR-FIVE;  Surgeon: Mariam Dollar, MD;  Location: [facility] OR;  Service: Neurosurgery;  Laterality: N/A;   BACK SURGERY Bilateral 2001   C4-C5 DISK "fusion"   CERVICAL SPINE SURGERY     DILATATION & CURETTAGE/HYSTEROSCOPY WITH TRUECLEAR     3'14-endometrial polyp-Womens   DILATION AND CURETTAGE OF UTERUS     ESOPHAGEAL MANOMETRY N/A 05/24/2013   Procedure: ESOPHAGEAL MANOMETRY (EM);  Surgeon: Willis Modena, MD;  Location: [facility];  Service: Endoscopy;  Laterality: N/A;   ESOPHAGOGASTRODUODENOSCOPY     ESOPHAGOGASTRODUODENOSCOPY (EGD) WITH PROPOFOL N/A 05/24/2013   Procedure: ESOPHAGOGASTRODUODENOSCOPY (EGD) WITH PROPOFOL;  Surgeon: Willis Modena, MD;  Location: [facility];  Service: Endoscopy;  Laterality: N/A;   EXTERNAL EAR SURGERY     bilateral "pinning" age 17   MANDIBLE FRACTURE SURGERY  1990   retained hardware "wires"   MANDIBLE SURGERY       Current Outpatient Medications  Medication Sig Dispense Refill   acyclovir (ZOVIRAX) 400 MG tablet TAKE 1 TABLET BY MOUTH TWICE A DAY 180 tablet 1   fenofibrate (TRICOR) 48 MG tablet TAKE 1 TABLET BY MOUTH DAILY 30 tablet 1   HUMIRA PEN 40 MG/0.4ML PNKT SMARTSIG:40 Milligram(s) SUB-Q Every 2 Weeks     lansoprazole (PREVACID) 30 MG capsule Take 1 capsule (30 mg total) by mouth 2 (two) times daily before a meal. 180 capsule 3   montelukast (SINGULAIR) 10 MG tablet TAKE ONE TABLET BY MOUTH EVERY NIGHT AT BEDTIME 30 tablet 1   norgestimate-ethinyl estradiol (SPRINTEC 28)  0.25-35 MG-MCG tablet Take 1 tablet by mouth daily. 112 tablet 3   SYMBICORT 160-4.5 MCG/ACT inhaler INHALE 2 PUFFS BY MOUTH TWICE A DAY 10.2 g 1   No current facility-administered medications for this visit.    Allergies as of 11/29/2021 - Review Complete 11/29/2021  Allergen Reaction Noted   Codeine Nausea And Vomiting 01/19/2014   Hydrocodone Nausea And Vomiting 03/23/2012   Oxycodone Nausea And Vomiting 04/09/2012   Sumatriptan Other (See Comments) 08/26/2014    Family History  Problem Relation Age of Onset   Diabetes Mother    Rheum arthritis Mother    Arthritis Mother    Cancer Mother    Rectal cancer Mother    Luiz Blare' disease Sister    Colon cancer Neg Hx    Stomach cancer Neg Hx    Esophageal cancer Neg Hx    Pancreatic cancer Neg Hx    Liver cancer Neg Hx     Social History   Socioeconomic History   Marital status: Married    Spouse name: Not on file   Number of children: Not on file   Years of education: Not on  file   Highest education level: Not on file  Occupational History   Not on file  Tobacco Use   Smoking status: Never   Smokeless tobacco: Never  Vaping Use   Vaping Use: Never used  Substance and Sexual Activity   Alcohol use: Not Currently    Alcohol/week: 0.0 standard drinks of alcohol   Drug use: No   Sexual activity: Not Currently    Birth control/protection: Pill  Other Topics Concern   Not on file  Social History Narrative   ** Merged History Encounter **       Social Determinants of Health   Financial Resource Strain: Not on file  Food Insecurity: Not on file  Transportation Needs: Not on file  Physical Activity: Not on file  Stress: Not on file  Social Connections: Not on file  Intimate Partner Violence: Not on file    Review of Systems: 12 system ROS is negative except as noted above with the additions of allergies and that she is prone to arthritis.   Physical Exam: General:   Alert,  well-nourished, pleasant and  cooperative in NAD Head:  Normocephalic and atraumatic. Eyes:  Sclera clear, no icterus.   Conjunctiva pink. Ears:  Normal auditory acuity. Nose:  No deformity, discharge,  or lesions. Mouth:  No deformity or lesions.   Neck:  Supple; no masses or thyromegaly. Lungs:  Clear throughout to auscultation.   No wheezes. Heart:  Regular rate and rhythm; no murmurs. Abdomen:  Soft, nontender, nondistended, normal bowel sounds, no rebound or guarding. No hepatosplenomegaly.   Rectal:  Deferred  Msk:  Symmetrical. No boney deformities LAD: No inguinal or umbilical LAD Extremities:  No clubbing or edema. Neurologic:  Alert and  oriented x4;  grossly nonfocal Skin:  Intact without significant lesions or rashes. Psych:  Alert and cooperative. Normal mood and affect.     Anna Torres L. Orvan Falconer, MD, MPH 11/29/2021, 2:12 PM

## 2021-11-29 ENCOUNTER — Encounter: Payer: Self-pay | Admitting: Gastroenterology

## 2021-11-29 ENCOUNTER — Ambulatory Visit: Payer: 59 | Admitting: Gastroenterology

## 2021-11-29 VITALS — BP 122/78 | HR 96 | Ht 62.0 in | Wt 133.0 lb

## 2021-11-29 DIAGNOSIS — Z8379 Family history of other diseases of the digestive system: Secondary | ICD-10-CM | POA: Diagnosis not present

## 2021-11-29 DIAGNOSIS — K219 Gastro-esophageal reflux disease without esophagitis: Secondary | ICD-10-CM

## 2021-11-29 DIAGNOSIS — Z1211 Encounter for screening for malignant neoplasm of colon: Secondary | ICD-10-CM | POA: Diagnosis not present

## 2021-11-29 DIAGNOSIS — R0789 Other chest pain: Secondary | ICD-10-CM

## 2021-11-29 MED ORDER — LANSOPRAZOLE 30 MG PO CPDR: 30.0000 mg | DELAYED_RELEASE_CAPSULE | Freq: Two times a day (BID) | ORAL | 3 refills | Status: DC

## 2021-11-29 NOTE — Patient Instructions (Addendum)
It was a pleasure to meet you today.  We discussed an upper endoscopy for evaluation of your long-term reflux not responding to Dexilant any more.  Modifying diet and lifestyle remains the foundation for treating the symptoms of reflux.   The following strategies help you prevent heartburn and other symptoms by avoiding foods that reduce the effectiveness of the bottom of the esophagus from protecting the esophagus from from acid injury and keeping stomach contents where they belong.  Eat smaller meals. A large meal remains in the stomach for several hours, increasing the chances for gastroesophageal reflux. Try distributing your daily food intake over three, four, or five smaller meals.  Relax when you eat. Stress increases the production of stomach acid, so make meals a pleasant, relaxing experience. Sit down. Eat slowly. Chew completely. Play soothing music.  Relax between meals. Relaxation therapies such as deep breathing, meditation, massage, tai chi, or yoga may help prevent and relieve heartburn.   Remain upright after eating. You should maintain postures that reduce the risk for reflux for at least three hours after eating. For example, don't bend over or strain to lift heavy objects.  Avoid eating within three hours of going to bed. Lying down after eating will increase chances of reflux.  Lose weight. Excess pounds increase pressure on the stomach and can push acid into the esophagus.  Loosen up. Avoid tight belts, waistbands, and other clothing that puts pressure on your stomach.  Avoid foods that burn. Abstain from food or drink that increases gastric acid secretion, decreases the valve at the bottom of the esophagus, or slows the emptying of the stomach. Known offenders include high-fat foods, spicy dishes, tomatoes and tomato products, citrus fruits, garlic, onions, milk, carbonated drinks, coffee (including decaf), tea, chocolate, mints, and alcohol.  Avoid medications that can  predispose you to reflux including aspirin and other NSAIDs, oral contraceptives, hormone therapy drugs, and certain antidepressants.  Sleep at an angle. If you're bothered by nighttime heartburn, place a wedge (available in medical supply stores or a wedge pillow through [employer]) under your upper body. But don't elevate your head with extra pillows. That makes reflux worse by bending you at the waist and compressing your stomach. You might also try sleeping on your left side, as studies have shown this can help--perhaps because the stomach is on the left side of the body, so lying on your left positions most of the stomach below the bottom of the esophagus.   We discussed a trial of Prevacid 30 mg twice daily.   You are also overdue colon cancer screening. We now start colon cancer screening at age 57. You declined colonoscopy and we discussed the possibility for undiagnosed polyps and colon cancer. Please have the Cologuard. It is definitely not a perfect test but it is better than nothing.   Your provider has ordered Cologuard testing as an option for colon cancer screening. This is performed by [employer] and may be out of network with your insurance. PRIOR to completing the test, it is YOUR responsibility to contact your insurance about covered benefits for this test. Your out of pocket expense could be anywhere from $0.00 to $649.00.   When you call to check coverage with your insurer, please provide the following information:   -The ONLY provider of Cologuard is [place] code for Cologuard is [phone].  [employer] NPI # [id number]  -[employer] Tax ID # [id number]   We have already sent  your demographic and insurance information to [employer] (phone number [phone]) and they should contact you within the next week regarding your test. If you have not heard from them within the next week, please call our office at  [phone].

## 2021-12-17 ENCOUNTER — Ambulatory Visit (AMBULATORY_SURGERY_CENTER): Payer: 59 | Admitting: Gastroenterology

## 2021-12-17 ENCOUNTER — Encounter: Payer: Self-pay | Admitting: Gastroenterology

## 2021-12-17 VITALS — BP 134/82 | HR 90 | Temp 97.7°F | Resp 16 | Ht 62.0 in | Wt 133.0 lb

## 2021-12-17 DIAGNOSIS — K219 Gastro-esophageal reflux disease without esophagitis: Secondary | ICD-10-CM

## 2021-12-17 DIAGNOSIS — K317 Polyp of stomach and duodenum: Secondary | ICD-10-CM | POA: Diagnosis not present

## 2021-12-17 DIAGNOSIS — R0789 Other chest pain: Secondary | ICD-10-CM

## 2021-12-17 DIAGNOSIS — K2970 Gastritis, unspecified, without bleeding: Secondary | ICD-10-CM | POA: Diagnosis not present

## 2021-12-17 MED ORDER — SODIUM CHLORIDE 0.9 % IV SOLN: 500.0000 mL | Freq: Once | INTRAVENOUS | Status: DC

## 2021-12-17 NOTE — Progress Notes (Signed)
A and O x3. Report to RN. Tolerated MAC anesthesia well.Teeth unchanged after procedure. 

## 2021-12-17 NOTE — Op Note (Signed)
[facility] Patient Name: Anna Torres Procedure Date: 12/17/2021 10:36 AM MRN: [id number] Endoscopist: Tressia Danas MD, MD, [id number] Age: 51 Referring MD:  Date of Birth: 10/06/1970 Gender: Female Account #: [id number] Procedure:                Upper GI endoscopy Indications:              Esophageal reflux symptoms that persist despite                            appropriate therapy, Unexplained chest pain, family                            history of celiac disease Medicines:                Monitored Anesthesia Care Procedure:                Pre-Anesthesia Assessment:                           - Prior to the procedure, a History and Physical                            was performed, and patient medications and                            allergies were reviewed. The patient's tolerance of                            previous anesthesia was also reviewed. The risks                            and benefits of the procedure and the sedation                            options and risks were discussed with the patient.                            All questions were answered, and informed consent                            was obtained. Prior Anticoagulants: The patient has                            taken no anticoagulant or antiplatelet agents. ASA                            Grade Assessment: II - A patient with mild systemic                            disease. After reviewing the risks and benefits,                            the patient was deemed in satisfactory condition to  undergo the procedure.                           After obtaining informed consent, the endoscope was                            passed under direct vision. Throughout the                            procedure, the patient's blood pressure, pulse, and                            oxygen saturations were monitored continuously. The                            GIF [id number] #[id number]  was introduced through the                            mouth, and advanced to the third part of duodenum.                            The upper GI endoscopy was accomplished without                            difficulty. The patient tolerated the procedure                            well. Scope In: Scope Out: Findings:                 The examined esophagus was normal. The z-line is                            located 34 cm from the incisors. Biopsies were                            obtained from the proximal and distal esophagus                            with cold forceps for histology.                           Multiple medium sessile polyps with no stigmata of                            recent bleeding were found in the gastric fundus                            and in the gastric body. Biopsies were taken with a                            cold forceps for histology. Estimated blood loss                            was  minimal.                           The entire examined stomach was normal except for                            mild patchy erythema. Biopsies were taken from the                            antrum, body, and fundus with a cold forceps for                            histology. Estimated blood loss was minimal.                           A small hiatal hernia was present.                           The examined duodenum was normal. Biopsies were                            taken with a cold forceps for histology. Estimated                            blood loss was minimal.                           The cardia and gastric fundus were normal on                            retroflexion.                           The exam was otherwise without abnormality. Complications:            No immediate complications. Estimated Blood Loss:     Estimated blood loss was minimal. Impression:               - Normal esophagus. Biopsied.                           - Multiple gastric polyps.  Suspected fundic gland                            polyps. Biopsied.                           - Mild gastritis. Biopsied.                           - Small hiatal hernia.                           - Normal examined duodenum. Biopsied.                           - The examination was otherwise normal. Recommendation:           -  Patient has a contact number available for                            emergencies. The signs and symptoms of potential                            delayed complications were discussed with the                            patient. Return to normal activities tomorrow.                            Written discharge instructions were provided to the                            patient.                           - Resume previous diet.                           - Continue present medications.                           - Await pathology results.                           - Reflux lifestyle modifications. Tressia Danas MD, MD 12/17/2021 10:58:06 AM This report has been signed electronically.

## 2021-12-17 NOTE — Patient Instructions (Addendum)
RECOMMENDATIONS: - Patient has a contact number available for emergencies. The signs and symptoms of potential delayed complications were discussed with the patient. Return to normal activities tomorrow. Written discharge instructions were provided to the patient. - Resume previous diet. - Continue present medications. - Await pathology results. - Reflux lifestyle modifications.   YOU HAD AN ENDOSCOPIC PROCEDURE TODAY AT THE [facility]:   Refer to the procedure report that was given to you for any specific questions about what was found during the examination.  If the procedure report does not answer your questions, please call your gastroenterologist to clarify.  If you requested that your care partner not be given the details of your procedure findings, then the procedure report has been included in a sealed envelope for you to review at your convenience later.  YOU SHOULD EXPECT: Some feelings of bloating in the abdomen. Passage of more gas than usual.  Walking can help get rid of the air that was put into your GI tract during the procedure and reduce the bloating. If you had a lower endoscopy (such as a colonoscopy or flexible sigmoidoscopy) you may notice spotting of blood in your stool or on the toilet paper. If you underwent a bowel prep for your procedure, you may not have a normal bowel movement for a few days.  Please Note:  You might notice some irritation and congestion in your nose or some drainage.  This is from the oxygen used during your procedure.  There is no need for concern and it should clear up in a day or so.  SYMPTOMS TO REPORT IMMEDIATELY:   Following upper endoscopy (EGD)  Vomiting of blood or coffee ground material  New chest pain or pain under the shoulder blades  Painful or persistently difficult swallowing  New shortness of breath  Fever of 100F or higher  Black, tarry-looking stools  For urgent or emergent issues, a gastroenterologist can be  reached at any hour by calling [phone]. Do not use MyChart messaging for urgent concerns.    DIET:  We do recommend a small meal at first, but then you may proceed to your regular diet.  Drink plenty of fluids but you should avoid alcoholic beverages for 24 hours.  MEDICATIONS: Continue present medications.  Follow ANTI-REFLUX lifestyle modifications.(see handout given to you by your recovery nurse).  Please see all handouts given to you by your recovery nurse.  Thank you for allowing [place] to provide for your healthcare needs today.  ACTIVITY:  You should plan to take it easy for the rest of today and you should NOT DRIVE or use heavy machinery until tomorrow (because of the sedation medicines used during the test).    FOLLOW UP: Our staff will call the number listed on your records the next business day following your procedure.  We will call around 7:15- 8:00 am to check on you and address any questions or concerns that you may have regarding the information given to you following your procedure. If we do not reach you, we will leave a message.     If any biopsies were taken you will be contacted by phone or by letter within the next 1-3 weeks.  Please call us at [phone] if you have not heard about the biopsies in 3 weeks.    SIGNATURES/CONFIDENTIALITY: You and/or your care partner have signed paperwork which will be entered into your electronic medical record.  These signatures attest to the fact that that the information  above on your After Visit Summary has been reviewed and is understood.  Full responsibility of the confidentiality of this discharge information lies with you and/or your care-partner. 

## 2021-12-17 NOTE — Progress Notes (Signed)
Indication for upper endoscopy: Reflux with symptoms after adequate control on treatment for many years, atypical chest pain, family history of celiac  Please see my 11/29/2021 office note for complete details.  There is been no significant change in history or physical exam since that time.  She remains an appropriate candidate for monitored anesthesia care in the [facility] today.

## 2021-12-17 NOTE — Progress Notes (Signed)
Pt's states no medical or surgical changes since previsit or office visit. VS assessed by D.T 

## 2021-12-17 NOTE — Progress Notes (Signed)
Called to room to assist during endoscopic procedure.  Patient ID and intended procedure confirmed with present staff. Received instructions for my participation in the procedure from the performing physician.  

## 2021-12-18 ENCOUNTER — Telehealth: Payer: Self-pay | Admitting: *Deleted

## 2021-12-18 NOTE — Telephone Encounter (Signed)
  Follow up Call-     12/17/2021   10:21 AM  Call back number  Post procedure Call Back phone  # [phone]  Permission to leave phone message Yes     Patient questions:  Do you have a fever, pain , or abdominal swelling? No. Pain Score  0 *  Have you tolerated food without any problems? Yes.    Have you been able to return to your normal activities? Yes.    Do you have any questions about your discharge instructions: Diet   No. Medications  No. Follow up visit  No.  Do you have questions or concerns about your Care? No.  Actions: * If pain score is 4 or above: No action needed, pain <4.

## 2021-12-22 LAB — COLOGUARD: COLOGUARD: NEGATIVE

## 2021-12-24 ENCOUNTER — Encounter: Payer: Self-pay | Admitting: Gastroenterology

## 2022-01-05 ENCOUNTER — Other Ambulatory Visit: Payer: Self-pay | Admitting: Internal Medicine

## 2022-01-18 ENCOUNTER — Other Ambulatory Visit: Payer: Self-pay | Admitting: Internal Medicine

## 2022-01-19 ENCOUNTER — Encounter: Payer: Self-pay | Admitting: Internal Medicine

## 2022-02-13 ENCOUNTER — Ambulatory Visit: Payer: 59 | Admitting: Internal Medicine

## 2022-02-13 ENCOUNTER — Encounter: Payer: Self-pay | Admitting: Internal Medicine

## 2022-02-13 VITALS — BP 122/76 | HR 133 | Temp 99.8°F | Ht 62.0 in | Wt 128.0 lb

## 2022-02-13 DIAGNOSIS — U071 COVID-19: Secondary | ICD-10-CM

## 2022-02-13 LAB — POCT INFLUENZA A/B
Influenza A, POC: NEGATIVE
Influenza B, POC: NEGATIVE

## 2022-02-13 LAB — POC COVID19 BINAXNOW: SARS Coronavirus 2 Ag: POSITIVE — AB

## 2022-02-13 LAB — POCT RESPIRATORY SYNCYTIAL VIRUS: RSV Rapid Ag: NEGATIVE

## 2022-02-13 MED ORDER — NIRMATRELVIR/RITONAVIR (PAXLOVID)TABLET: 3.0000 | ORAL_TABLET | Freq: Two times a day (BID) | ORAL | 0 refills | Status: AC

## 2022-02-13 NOTE — Progress Notes (Signed)
Subjective:    Patient ID: Anna Torres, female    DOB: August 29, 1970, 52 y.o.   MRN: [id number]      HPI Anna Torres is here for  Chief Complaint  Patient presents with   Cough    Chest pressure , lower back pain in the kidney area, Saturday was in a house with mold and she is allergic to mold and that is when the cough started     Symptoms started Saturday - 4 days ago  Saturday she was in a house that had mold and she is allergic to mold.  Saturday night she started coughing.  Yesterday she had nasal congestion. Last night started having lower back pain and radiating to mid back.  She thinks she has a fever.  She also has chest pressure - she has had this in the past - GERD and asthma.     Halls cough drops, nasal spray.  Using symbicort regularly.   GFR 11/2021 - 68   Medications and allergies reviewed with patient and updated if appropriate.  Current Outpatient Medications on File Prior to Visit  Medication Sig Dispense Refill   budesonide-formoterol (SYMBICORT) 160-4.5 MCG/ACT inhaler INHALE 2 PUFFS BY MOUTH TWICE A DAY 10.2 g 0   fenofibrate (TRICOR) 48 MG tablet TAKE 1 TABLET BY MOUTH DAILY 30 tablet 0   HUMIRA PEN 40 MG/0.4ML PNKT SMARTSIG:40 Milligram(s) SUB-Q Every 2 Weeks     lansoprazole (PREVACID) 30 MG capsule Take 1 capsule (30 mg total) by mouth 2 (two) times daily before a meal. 180 capsule 3   montelukast (SINGULAIR) 10 MG tablet TAKE ONE TABLET BY MOUTH EVERY NIGHT AT BEDTIME 30 tablet 1   norgestimate-ethinyl estradiol (SPRINTEC 28) 0.25-35 MG-MCG tablet Take 1 tablet by mouth daily. 112 tablet 3   acyclovir (ZOVIRAX) 400 MG tablet TAKE 1 TABLET BY MOUTH TWICE A DAY (Patient not taking: Reported on 12/17/2021) 180 tablet 1   Current Facility-Administered Medications on File Prior to Visit  Medication Dose Route Frequency Provider Last Rate Last Admin   0.9 %  sodium chloride infusion  500 mL Intravenous Once [place], MD        Review of  Systems  Constitutional:  Positive for fever.  HENT:  Positive for congestion, ear pain (discomfort with blowing nose), postnasal drip, sinus pressure and sore throat.   Respiratory:  Positive for cough (dry, occ mucus). Negative for shortness of breath and wheezing.   Cardiovascular:  Positive for chest pain (chest pressure).  Gastrointestinal:  Negative for diarrhea and nausea.  Musculoskeletal:  Positive for back pain (lower back pain) and myalgias.  Neurological:  Positive for dizziness (mild). Negative for light-headedness and headaches.       Objective:   Vitals:   02/13/22 1358  BP: 122/76  Pulse: (!) 133  Temp: 99.8 F (37.7 C)  SpO2: 99%   BP Readings from Last 3 Encounters:  02/13/22 122/76  12/17/21 134/82  11/29/21 122/78   Wt Readings from Last 3 Encounters:  02/13/22 128 lb (58.1 kg)  12/17/21 133 lb (60.3 kg)  11/29/21 133 lb (60.3 kg)   Body mass index is 23.41 kg/m.    Physical Exam Constitutional:      General: She is not in acute distress.    Appearance: Normal appearance. She is not ill-appearing.  HENT:     Head: Normocephalic and atraumatic.     Right Ear: Tympanic membrane, ear canal and external ear normal.  Left Ear: Tympanic membrane, ear canal and external ear normal.     Mouth/Throat:     Mouth: Mucous membranes are moist.     Pharynx: No oropharyngeal exudate or posterior oropharyngeal erythema.  Eyes:     Conjunctiva/sclera: Conjunctivae normal.  Cardiovascular:     Rate and Rhythm: Normal rate and regular rhythm.  Pulmonary:     Effort: Pulmonary effort is normal. No respiratory distress.     Breath sounds: Normal breath sounds. No wheezing or rales.  Musculoskeletal:     Cervical back: Neck supple. No tenderness.  Lymphadenopathy:     Cervical: No cervical adenopathy.  Skin:    General: Skin is warm and dry.  Neurological:     Mental Status: She is alert.         COVID test here today is positive.  Flu A/B and RSV  test negative   Assessment & Plan:   COVID: Acute Discussed quarantine recommendations Discussed treatment options-will start Paxlovid 3 tabs twice daily x 5 days Last GFR was done by rheumatology-in media section Continue Symbicort twice daily Discussed over-the-counter medications for symptom relief Rest, fluids She will call with any questions or concerns

## 2022-02-13 NOTE — Patient Instructions (Addendum)
Medications changes include :   Paxlovid 3 pills twice daily x 5 days     Return if symptoms worsen or fail to improve.      COVID-19 COVID-19, or coronavirus disease 2019, is an infection that is caused by a new (novel) coronavirus called SARS-CoV-2. COVID-19 can cause many symptoms. In some people, the virus may not cause any symptoms. In others, it may cause mild or severe symptoms. Some people with severe infection develop severe disease. What are the causes? This illness is caused by a virus. The virus may be in the air as tiny specks of fluid (aerosols) or droplets, or it may be on surfaces. You may catch the virus by: Breathing in droplets from an infected person. Droplets can be spread by a person breathing, speaking, singing, coughing, or sneezing. Touching something, like a table or a doorknob, that has virus on it (is contaminated) and then touching your mouth, nose, or eyes. What increases the risk? Risk for infection: You are more likely to get infected with the COVID-19 virus if: You are within 6 ft (1.8 m) of a person with COVID-19 for 15 minutes or longer. You are providing care for a person who is infected with COVID-19. You are in close personal contact with other people. Close personal contact includes hugging, kissing, or sharing eating or drinking utensils. Risk for serious illness caused by COVID-19: You are more likely to get seriously ill from the COVID-19 virus if: You have cancer. You have a long-term (chronic) disease, such as: Chronic lung disease. This includes pulmonary embolism, chronic obstructive pulmonary disease, and cystic fibrosis. Long-term disease that lowers your body's ability to fight infection (immunocompromise). Serious cardiac conditions, such as heart failure, coronary artery disease, or cardiomyopathy. Diabetes. Chronic kidney disease. Liver diseases. These include cirrhosis, nonalcoholic fatty liver disease, alcoholic liver  disease, or autoimmune hepatitis. You have obesity. You are pregnant or were recently pregnant. You have sickle cell disease. What are the signs or symptoms? Symptoms of this condition can range from mild to severe. Symptoms may appear any time from 2 to 14 days after being exposed to the virus. They include: Fever or chills. Shortness of breath or trouble breathing. Feeling tired or very tired. Headaches, body aches, or muscle aches. Runny or stuffy nose, sneezing, coughing, or sore throat. New loss of taste or smell. This is rare. Some people may also have stomach problems, such as nausea, vomiting, or diarrhea. Other people may not have any symptoms of COVID-19. How is this diagnosed? This condition may be diagnosed by testing samples to check for the COVID-19 virus. The most common tests are the PCR test and the antigen test. Tests may be done in the lab or at home. They include: Using a swab to take a sample of fluid from the back of your nose and throat (nasopharyngeal fluid), from your nose, or from your throat. Testing a sample of saliva from your mouth. Testing a sample of coughed-up mucus from your lungs (sputum). How is this treated? Treatment for COVID-19 infection depends on the severity of the condition. Mild symptoms can be managed at home with rest, fluids, and over-the-counter medicines. Serious symptoms may be treated in a hospital intensive care unit (ICU). Treatment in the ICU may include: Supplemental oxygen. Extra oxygen is given through a tube in the nose, a face mask, or a hood. Medicines. These may include: Antivirals, such as monoclonal antibodies. These help your body fight off certain  viruses that can cause disease. Anti-inflammatories, such as corticosteroids. These reduce inflammation and suppress the immune system. Antithrombotics. These prevent or treat blood clots, if they develop. Convalescent plasma. This helps boost your immune system, if you have an  underlying immunosuppressive condition or are getting immunosuppressive treatments. Prone positioning. This means you will lie on your stomach. This helps oxygen to get into your lungs. Infection control measures. If you are at risk for more serious illness caused by COVID-19, your health care provider may prescribe two long-acting monoclonal antibodies, given together every 6 months. How is this prevented? To protect yourself: Use preventive medicine (pre-exposure prophylaxis). You may get pre-exposure prophylaxis if you have moderate or severe immunocompromise. Get vaccinated. Anyone 54 months old or older who meets guidelines can get a COVID-19 vaccine or vaccine series. This includes people who are pregnant or making breast milk (lactating). Get an added dose of COVID-19 vaccine after your first vaccine or vaccine series if you have moderate to severe immunocompromise. This applies if you have had a solid organ transplant or have been diagnosed with an immunocompromising condition. You should get the added dose 4 weeks after you got the first COVID-19 vaccine or vaccine series. If you get an mRNA vaccine, you will need a 3-dose primary series. If you get the J&J/Janssen vaccine, you will need a 2-dose primary series, with the second dose being an mRNA vaccine. Talk to your health care provider about getting experimental monoclonal antibodies. This treatment is approved under emergency use authorization to prevent severe illness before or after being exposed to the COVID-19 virus. You may be given monoclonal antibodies if: You have moderate or severe immunocompromise. This includes treatments that lower your immune response. People with immunocompromise may not develop protection against COVID-19 when they are vaccinated. You cannot be vaccinated. You may not get a vaccine if you have a severe allergic reaction to the vaccine or its components. You are not fully vaccinated. You are in a facility  where COVID-19 is present and: Are in close contact with a person who is infected with the COVID-19 virus. Are at high risk of being exposed to the COVID-19 virus. You are at risk of illness from new variants of the COVID-19 virus. To protect others: If you have symptoms of COVID-19, take steps to prevent the virus from spreading to others. Stay home. Leave your house only to get medical care. Do not use public transit, if possible. Do not travel while you are sick. Wash your hands often with soap and water for at least 20 seconds. If soap and water are not available, use alcohol-based hand sanitizer. Make sure that all people in your household wash their hands well and often. Cough or sneeze into a tissue or your sleeve or elbow. Do not cough or sneeze into your hand or into the air. Where to find more information Centers for Disease Control and Prevention: [employer]armC[employer].be World Health Organization: https://www.castaneda.info/ Get help right away if: You have trouble breathing. You have pain or pressure in your chest. You are confused. You have bluish lips and fingernails. You have trouble waking from sleep. You have symptoms that get worse. These symptoms may be an emergency. Get help right away. Call 911. Do not wait to see if the symptoms will go away. Do not drive yourself to the hospital. Summary COVID-19 is an infection that is caused by a new coronavirus. Sometimes, there are no symptoms. Other times, symptoms range from mild to severe. Some people with  a severe COVID-19 infection develop severe disease. The virus that causes COVID-19 can spread from person to person through droplets or aerosols from breathing, speaking, singing, coughing, or sneezing. Mild symptoms of COVID-19 can be managed at home with rest, fluids, and over-the-counter medicines. This information is not intended to replace advice given to you by your health care provider. Make sure you  discuss any questions you have with your health care provider. Document Revised: 12/26/2020 Document Reviewed: 12/28/2020 Elsevier Patient Education  2023 [employer].

## 2022-02-14 ENCOUNTER — Encounter: Payer: Self-pay | Admitting: Internal Medicine

## 2022-02-14 ENCOUNTER — Ambulatory Visit: Payer: 59 | Admitting: Nurse Practitioner

## 2022-02-14 ENCOUNTER — Ambulatory Visit (INDEPENDENT_AMBULATORY_CARE_PROVIDER_SITE_OTHER): Payer: 59 | Admitting: Internal Medicine

## 2022-02-14 VITALS — BP 122/80 | HR 107 | Temp 98.4°F | Ht 62.0 in | Wt 127.0 lb

## 2022-02-14 DIAGNOSIS — Z0001 Encounter for general adult medical examination with abnormal findings: Secondary | ICD-10-CM

## 2022-02-14 DIAGNOSIS — Z3041 Encounter for surveillance of contraceptive pills: Secondary | ICD-10-CM

## 2022-02-14 DIAGNOSIS — R0789 Other chest pain: Secondary | ICD-10-CM

## 2022-02-14 DIAGNOSIS — E781 Pure hyperglyceridemia: Secondary | ICD-10-CM

## 2022-02-14 DIAGNOSIS — R634 Abnormal weight loss: Secondary | ICD-10-CM

## 2022-02-14 DIAGNOSIS — J4520 Mild intermittent asthma, uncomplicated: Secondary | ICD-10-CM

## 2022-02-14 DIAGNOSIS — Z23 Encounter for immunization: Secondary | ICD-10-CM | POA: Diagnosis not present

## 2022-02-14 DIAGNOSIS — M0609 Rheumatoid arthritis without rheumatoid factor, multiple sites: Secondary | ICD-10-CM

## 2022-02-14 MED ORDER — SUCRALFATE 1 G PO TABS: 1.0000 g | ORAL_TABLET | Freq: Three times a day (TID) | ORAL | 0 refills | Status: DC

## 2022-02-14 NOTE — Assessment & Plan Note (Signed)
Continues on OCP and will refill as needed.

## 2022-02-14 NOTE — Assessment & Plan Note (Signed)
Do not think the chest tightness is asthma related sounds more GERD related. Continue symbicort BID prn no flare today. She does have active covid-19 but this is mild at this time and taking paxlovid.

## 2022-02-14 NOTE — Assessment & Plan Note (Signed)
Sounds related to GERD but is concerning she has lost 10 pounds since last year related to not being able to eat. She did decide to see GI and they did EGD and changed her PPI. She is not seeing any improvement so we will add sucralfate 1g TID AC and QHS. She has follow up with GI next week hopefully she will have some improvement by then.

## 2022-02-14 NOTE — Assessment & Plan Note (Signed)
She is down 10 pounds since last year and about 15 since onset of chest tightness GI symptoms. She will follow up with GI next week and we have added medication to help. She will need follow up 3-6 months with [place] to monitor. If persistent with better eating habits may need more thorough assessment.

## 2022-02-14 NOTE — Patient Instructions (Addendum)
We have sent in sucralfate to take 1 pill before meals and before bedtime. This should help with the chest tightness.

## 2022-02-14 NOTE — Progress Notes (Signed)
   Subjective:   Patient ID: Anna Torres, female    DOB: 12/11/1970, 52 y.o.   MRN: [id number]  HPI The patient is here for physical, also acute concerns about tightness in her chest. Extensive workup with GI with EGD and change in PPI without relief.   PMH, [facility], social history reviewed and updated  Review of Systems  Constitutional:  Positive for unexpected weight change.  HENT: Negative.    Eyes: Negative.   Respiratory:  Positive for chest tightness. Negative for cough and shortness of breath.   Cardiovascular:  Negative for chest pain, palpitations and leg swelling.  Gastrointestinal:  Negative for abdominal distention, abdominal pain, constipation, diarrhea, nausea and vomiting.  Musculoskeletal: Negative.   Skin: Negative.   Neurological: Negative.   Psychiatric/Behavioral: Negative.      Objective:  Physical Exam Constitutional:      Appearance: She is well-developed.  HENT:     Head: Normocephalic and atraumatic.  Cardiovascular:     Rate and Rhythm: Normal rate and regular rhythm.  Pulmonary:     Effort: Pulmonary effort is normal. No respiratory distress.     Breath sounds: Normal breath sounds. No wheezing or rales.  Abdominal:     General: Bowel sounds are normal. There is no distension.     Palpations: Abdomen is soft.     Tenderness: There is no abdominal tenderness. There is no rebound.  Musculoskeletal:     Cervical back: Normal range of motion.  Skin:    General: Skin is warm and dry.  Neurological:     Mental Status: She is alert and oriented to person, place, and time.     Coordination: Coordination normal.     Vitals:   02/14/22 0800  BP: 122/80  Pulse: (!) 107  Temp: 98.4 F (36.9 C)  TempSrc: Oral  SpO2: 97%  Weight: 127 lb (57.6 kg)  Height: 5\' 2"  (1.575 m)    Assessment & Plan:  Shingrix IM given at visit

## 2022-02-14 NOTE — Assessment & Plan Note (Signed)
Flu shot up to date. Covid-19 up to date. Shingrix given 1st today. Tetanus due declines. Cologuard up to date. Mammogram with gyn up to date, pap smear up to date with gyn. Counseled about sun safety and mole surveillance. Counseled about the dangers of distracted driving. Given 10 year screening recommendations.

## 2022-02-14 NOTE — Assessment & Plan Note (Signed)
Seeing rheumatology and on humira with decent control. Given shingrix and she is up to date on covid-19 and flu. Tdap not up to date declines today.

## 2022-02-14 NOTE — Progress Notes (Deleted)
   Anna Torres 12/06/1970 [id number]   History:  52 y.o. G0 presents for annual exam. OCPs continuously with withdrawal bleed every 3 months. She has noticed her menses are lighter and she experiences less PMS symptoms. Denies menopausal symptoms. History of CIN 1 years ago. RA, on methotrexate.   Gynecologic History No LMP recorded. (Menstrual status: Oral contraceptives).   Contraception/Family planning: OCP (estrogen/progesterone) Sexually active: Yes  Health Maintenance Last Pap: 04/02/2018. Results were: Normal, 5-year repeat Last mammogram: 01/05/2015. Results were: Normal Last colonoscopy: Never Last Dexa: Not indicated  Past medical history, past surgical history, family history and social history were all reviewed and documented in the EPIC chart. Married.   ROS:  A ROS was performed and pertinent positives and negatives are included.  Exam:  There were no vitals filed for this visit.   There is no height or weight on file to calculate BMI.  General appearance:  Normal Thyroid:  Symmetrical, normal in size, without palpable masses or nodularity. Respiratory  Auscultation:  Clear without wheezing or rhonchi Cardiovascular  Auscultation:  Regular rate, without rubs, murmurs or gallops  Edema/varicosities:  Not grossly evident Abdominal  Soft,nontender, without masses, guarding or rebound.  Liver/spleen:  No organomegaly noted  Hernia:  None appreciated  Skin  Inspection:  Grossly normal   Breasts: Examined lying and sitting.   Right: Without masses, retractions, discharge or axillary adenopathy.   Left: Without masses, retractions, discharge or axillary adenopathy. Genitourinary   Inguinal/mons:  Normal without inguinal adenopathy  External genitalia:  Normal appearing vulva with no masses, tenderness, or lesions  BUS/Urethra/Skene's glands:  Normal  Vagina:  Normal appearing with normal color and discharge, no lesions  Cervix:  Normal appearing without  discharge or lesions  Uterus:  Normal in size, shape and contour.  Midline and mobile, nontender  Adnexa/parametria:     Rt: Normal in size, without masses or tenderness.   Lt: Normal in size, without masses or tenderness.  Anus and perineum: Normal  Digital rectal exam: Declines  Patient informed chaperone available to be present for breast and pelvic exam. Patient has requested no chaperone to be present. Patient has been advised what will be completed during breast and pelvic exam.   Assessment/Plan:  52 y.o. G0 for annual exam.   Well female exam with routine gynecological exam - Education provided on SBEs, importance of preventative screenings, current guidelines, high calcium diet, regular exercise, and multivitamin daily. Labs with PCP.   Encounter for surveillance of contraceptive pills - Plan: norgestimate-ethinyl estradiol (SPRINTEC 28) 0.25-35 MG-MCG tablet daily. Taking as prescribed. Refill x 1 year provided.   Screening for cervical cancer - CIN 1 years ago. Will repeat pap at 5-year interval per guidelines.  Screening for breast cancer - Normal mammogram history although she is overdue.  Discussed current guidelines and importance of preventative screenings. [facility]. Normal breast exam today.  Screening for colon cancer - Discussed with PCP with plans to start Cologuard next year.  Follow up in 1 year for annual.    Tamela Gammon [facility], 7:40 AM 02/14/2022

## 2022-02-14 NOTE — Assessment & Plan Note (Signed)
No recent lipid panel and she thinks rheumatology checking however during visit pulled up those notes and no lipid panel checked. Also high sugar on fasting so ordered lipid panel and HgA1c today.

## 2022-02-15 ENCOUNTER — Ambulatory Visit: Payer: 59 | Admitting: Internal Medicine

## 2022-02-19 ENCOUNTER — Encounter: Payer: Self-pay | Admitting: Allergy and Immunology

## 2022-02-19 ENCOUNTER — Ambulatory Visit (INDEPENDENT_AMBULATORY_CARE_PROVIDER_SITE_OTHER): Payer: 59 | Admitting: Allergy and Immunology

## 2022-02-19 ENCOUNTER — Other Ambulatory Visit: Payer: Self-pay

## 2022-02-19 ENCOUNTER — Other Ambulatory Visit: Payer: Self-pay | Admitting: Internal Medicine

## 2022-02-19 VITALS — BP 130/80 | HR 85 | Temp 98.5°F | Resp 16 | Wt 128.5 lb

## 2022-02-19 DIAGNOSIS — K219 Gastro-esophageal reflux disease without esophagitis: Secondary | ICD-10-CM | POA: Diagnosis not present

## 2022-02-19 DIAGNOSIS — J4540 Moderate persistent asthma, uncomplicated: Secondary | ICD-10-CM | POA: Diagnosis not present

## 2022-02-19 DIAGNOSIS — J3089 Other allergic rhinitis: Secondary | ICD-10-CM

## 2022-02-19 MED ORDER — LANSOPRAZOLE 30 MG PO CPDR: 30.0000 mg | DELAYED_RELEASE_CAPSULE | Freq: Every morning | ORAL | 5 refills | Status: DC

## 2022-02-19 MED ORDER — SPACER/AERO-HOLDING CHAMBERS DEVI: 1.0000 | 1 refills | Status: AC

## 2022-02-19 MED ORDER — FAMOTIDINE 40 MG PO TABS: 40.0000 mg | ORAL_TABLET | Freq: Every evening | ORAL | 5 refills | Status: DC

## 2022-02-19 MED ORDER — BREZTRI AEROSPHERE 160-9-4.8 MCG/ACT IN AERO: 2.0000 | INHALATION_SPRAY | Freq: Two times a day (BID) | RESPIRATORY_TRACT | 5 refills | Status: DC

## 2022-02-19 MED ORDER — SUCRALFATE 1 G PO TABS: 1.0000 g | ORAL_TABLET | Freq: Two times a day (BID) | ORAL | 5 refills | Status: DC

## 2022-02-19 MED ORDER — AIRSUPRA 90-80 MCG/ACT IN AERO: 2.0000 | INHALATION_SPRAY | RESPIRATORY_TRACT | 1 refills | Status: DC | PRN

## 2022-02-19 NOTE — Patient Instructions (Addendum)
  1. Breztri - 2 inhalations 2 times per day w/spacer (empty lungs)  2. OTC Nasacort - 1 spray each nostril 2 times per day  3. Montelukast 10 mg - 1 tablet 1 time per day  4. Lansoprazole 30 mg - 1 tablet in AM  5. Famotidine 40mg  - 1 tablet in PM  6. Sucralfate 1 gm - 1 tablet 2 times per day  7. If needed: AirSupra - 2 inhalations every 4-6 hours (Coupon)  8. Review chest X-ray and EKG.  9. Return to clinic in 2 weeks or earlier if problem.

## 2022-02-19 NOTE — Progress Notes (Signed)
[place] [facility] [place] [facility] [place] [facility] [place] [facility] [place]   Follow-up Note  Referring Provider: Myrlene Broker, * Primary Provider: Myrlene Broker, MD Date of Office Visit: 02/19/2022  Subjective:   Anna Torres (DOB: [birth year]) is a 52 y.o. female who [facility] in re-evaluation of the following:  HPI: Anna Torres returns to the clinic in evaluation of asthma, allergic rhinitis, LPR.  I have not seen her in this clinic since 17 August 2019.  She states that she has been doing relatively well with her airway issue and her primary care doctor was handling her refills and rarely did she have an exacerbation that required a systemic steroid and for the most part her nose was doing well and her reflux was under good control.  But in summer 2023 things changed and she started to get this significant pressure in her sternal area especially after eating and this progressed to the point where it was there all the time and would occur spontaneously.  She will also have some deep breathing associated with these symptoms.  She did not really have any coughing or other respiratory tract symptoms or chest pain or significant GI symptoms.  She started to have an adverse approach to eating and she has lost about 20 pounds because she does not like to eat because it precipitates this issue.  She has had an upper endoscopy performed which apparently was normal.  She had her proton pump inhibitor changed to lansoprazole 30 mg once a day.  She has eliminated all caffeine and chocolate consumption.  Allergies as of 02/19/2022       Reactions   Codeine Nausea And Vomiting   Hydrocodone Nausea And Vomiting   Oxycodone Nausea And Vomiting   Sumatriptan Other (See Comments)   Heart palpitations,SOB, chest pressure and shakiness        Medication List    acyclovir 400 MG tablet Commonly known as: ZOVIRAX TAKE 1 TABLET BY MOUTH TWICE A DAY    Airsupra 90-80 MCG/ACT Aero Generic drug: Albuterol-Budesonide Inhale 2 puffs into the lungs every 4 (four) hours as needed. Started by: Jessica Priest, MD   Markus Daft Aerosphere [id number] MCG/ACT Aero Generic drug: Budeson-Glycopyrrol-Formoterol Inhale 2 puffs into the lungs in the morning and at bedtime. Started by: Jessica Priest, MD   famotidine 40 MG tablet Commonly known as: PEPCID Take 1 tablet (40 mg total) by mouth at bedtime. Started by: Jessica Priest, MD   fenofibrate 48 MG tablet Commonly known as: TRICOR TAKE 1 TABLET BY MOUTH DAILY   Humira (2 Pen) 40 MG/0.4ML Pnkt Generic drug: Adalimumab SMARTSIG:40 Milligram(s) SUB-Q Every 2 Weeks   lansoprazole 30 MG capsule Commonly known as: PREVACID Take 1 capsule (30 mg total) by mouth in the morning.   montelukast 10 MG tablet Commonly known as: SINGULAIR TAKE ONE TABLET BY MOUTH EVERY NIGHT AT BEDTIME   norgestimate-ethinyl estradiol 0.25-35 MG-MCG tablet Commonly known as: Sprintec 28 Take 1 tablet by mouth daily.   Spacer/Aero-Holding [employer] 1 Device by Does not apply route as directed. Started by: Jessica Priest, MD   sucralfate 1 g tablet Commonly known as: Carafate Take 1 tablet (1 g total) by mouth 2 (two) times daily.   Symbicort 160-4.5 MCG/ACT inhaler Generic drug: budesonide-formoterol INHALE 2 PUFFS BY MOUTH TWICE A DAY    Past Medical History:  Diagnosis Date   Allergy    Asthma  Asthma    last episode january 2015   CIN I (cervical intraepithelial neoplasia I)    Environmental and seasonal allergies    GERD (gastroesophageal reflux disease)    Headache    Herpes genitalis in women    Ureteral reflux 01/21/1973   Vitamin D deficiency     Past Surgical History:  Procedure Laterality Date   63 HOUR PH STUDY N/A 09/18/2015   Procedure: 24 HOUR PH STUDY;  Surgeon: Willis Modena, MD;  Location: [facility];  Service: Endoscopy;  Laterality: N/A;   ANTERIOR CERVICAL  DECOMP/DISCECTOMY FUSION N/A 01/31/2014   Procedure: ANTERIOR CERVICAL DECOMPRESSION/DISCECTOMY FUSION CERVICAL FIVE-SIX,CERVICAL SIX-SEVEN WITH HARDWARE REMOVAL OF CODMAN PLATE AT CERVIAL FOUR-FIVE;  Surgeon: Mariam Dollar, MD;  Location: [facility];  Service: Neurosurgery;  Laterality: N/A;   BACK SURGERY Bilateral 2001   C4-C5 DISK "fusion"   CERVICAL SPINE SURGERY     DILATATION & CURETTAGE/HYSTEROSCOPY WITH TRUECLEAR     3'14-endometrial polyp-[facility]   DILATION AND CURETTAGE OF UTERUS     ESOPHAGEAL MANOMETRY N/A 05/24/2013   Procedure: ESOPHAGEAL MANOMETRY (EM);  Surgeon: Willis Modena, MD;  Location: [facility];  Service: Endoscopy;  Laterality: N/A;   ESOPHAGOGASTRODUODENOSCOPY     ESOPHAGOGASTRODUODENOSCOPY (EGD) WITH PROPOFOL N/A 05/24/2013   Procedure: ESOPHAGOGASTRODUODENOSCOPY (EGD) WITH PROPOFOL;  Surgeon: Willis Modena, MD;  Location: [facility];  Service: Endoscopy;  Laterality: N/A;   EXTERNAL EAR SURGERY     bilateral "pinning" age 42   MANDIBLE FRACTURE SURGERY  1990   retained hardware "wires"   MANDIBLE SURGERY      Review of systems negative except as noted in HPI / PMHx or noted below:  Review of Systems  Constitutional: Negative.   HENT: Negative.    Eyes: Negative.   Respiratory: Negative.    Cardiovascular: Negative.   Gastrointestinal: Negative.   Genitourinary: Negative.   Musculoskeletal: Negative.   Skin: Negative.   Neurological: Negative.   Endo/Heme/Allergies: Negative.   Psychiatric/Behavioral: Negative.       Objective:   Vitals:   02/19/22 1353  BP: 130/80  Pulse: 85  Resp: 16  Temp: 98.5 F (36.9 C)  SpO2: 98%      Weight: 128 lb 8 oz (58.3 kg)   Physical Exam Constitutional:      Appearance: She is not diaphoretic.  HENT:     Head: Normocephalic.     Right Ear: Tympanic membrane, ear canal and external ear normal.     Left Ear: Tympanic membrane, ear canal and external ear normal.     Nose: Nose normal. No mucosal edema or  rhinorrhea.     Mouth/Throat:     Pharynx: Uvula midline. No oropharyngeal exudate.  Eyes:     Conjunctiva/sclera: Conjunctivae normal.  Neck:     Thyroid: No thyromegaly.     Trachea: Trachea normal. No tracheal tenderness or tracheal deviation.  Cardiovascular:     Rate and Rhythm: Normal rate and regular rhythm.     Heart sounds: Normal heart sounds, S1 normal and S2 normal. No murmur heard. Pulmonary:     Effort: No respiratory distress.     Breath sounds: Normal breath sounds. No stridor. No wheezing or rales.  Lymphadenopathy:     Head:     Right side of head: No tonsillar adenopathy.     Left side of head: No tonsillar adenopathy.     Cervical: No cervical adenopathy.  Skin:    Findings: No erythema or rash.  Nails: There is no clubbing.  Neurological:     Mental Status: She is alert.     Diagnostics:    Spirometry was performed and demonstrated an FEV1 of 1.88 at 73 % of predicted.  Assessment and Plan:   1. Not well controlled moderate persistent asthma   2. Perennial allergic rhinitis   3. Gastroesophageal reflux disease without esophagitis    1. Breztri - 2 inhalations 2 times per day w/spacer (empty lungs)  2. OTC Nasacort - 1 spray each nostril 2 times per day  3. Montelukast 10 mg - 1 tablet 1 time per day  4. Lansoprazole 30 mg - 1 tablet in AM  5. Famotidine 40mg  - 1 tablet in PM  6. Sucralfate 1 gm - 1 tablet 2 times per day  7. If needed: AirSupra - 2 inhalations every 4-6 hours (Coupon)  8. Review chest X-ray and EKG.  9. Return to clinic in 2 weeks or earlier if problem.   It is not entirely clear what is going on with Anna Torres regarding her respiratory tract and sternal pressure issues.  We will treat her with anti-inflammatory agents for her airway as noted above and will have her be a little bit more aggressive about addressing reflux as noted above.  I will review her chest x-ray and EKGs.  If she does not respond to any of this therapy  we need to consider the possibility she has pericarditis.  I will see her back in this clinic in 2 weeks or earlier if there is a problem.  Laurette Schimke, MD Allergy / Immunology [facility]

## 2022-02-20 ENCOUNTER — Encounter: Payer: Self-pay | Admitting: Allergy and Immunology

## 2022-02-20 ENCOUNTER — Ambulatory Visit: Payer: 59 | Admitting: Gastroenterology

## 2022-03-05 ENCOUNTER — Encounter: Payer: Self-pay | Admitting: Allergy and Immunology

## 2022-03-05 ENCOUNTER — Other Ambulatory Visit: Payer: Self-pay

## 2022-03-05 ENCOUNTER — Ambulatory Visit (INDEPENDENT_AMBULATORY_CARE_PROVIDER_SITE_OTHER): Payer: 59 | Admitting: Allergy and Immunology

## 2022-03-05 ENCOUNTER — Other Ambulatory Visit: Payer: Self-pay | Admitting: Internal Medicine

## 2022-03-05 VITALS — BP 102/80 | HR 93 | Temp 98.5°F | Resp 16 | Ht 61.75 in | Wt 127.5 lb

## 2022-03-05 DIAGNOSIS — J3089 Other allergic rhinitis: Secondary | ICD-10-CM

## 2022-03-05 DIAGNOSIS — K219 Gastro-esophageal reflux disease without esophagitis: Secondary | ICD-10-CM | POA: Diagnosis not present

## 2022-03-05 DIAGNOSIS — J4540 Moderate persistent asthma, uncomplicated: Secondary | ICD-10-CM

## 2022-03-05 MED ORDER — BREZTRI AEROSPHERE 160-9-4.8 MCG/ACT IN AERO: 2.0000 | INHALATION_SPRAY | Freq: Two times a day (BID) | RESPIRATORY_TRACT | 5 refills | Status: DC

## 2022-03-05 MED ORDER — FAMOTIDINE 40 MG PO TABS: 40.0000 mg | ORAL_TABLET | Freq: Every evening | ORAL | 5 refills | Status: DC

## 2022-03-05 MED ORDER — AIRSUPRA 90-80 MCG/ACT IN AERO: 2.0000 | INHALATION_SPRAY | RESPIRATORY_TRACT | 1 refills | Status: DC | PRN

## 2022-03-05 MED ORDER — LANSOPRAZOLE 30 MG PO CPDR: 30.0000 mg | DELAYED_RELEASE_CAPSULE | Freq: Every morning | ORAL | 5 refills | Status: DC

## 2022-03-05 NOTE — Patient Instructions (Addendum)
  1. Breztri - 2 inhalations 1-2 times per day w/spacer (empty lungs)  2. OTC Nasacort or OTC Zicam- 1 spray each nostril 1-2 times per day  3. Montelukast 10 mg - 1 tablet 1 time per day  4. Lansoprazole 30 mg - 1 tablet in AM  5. Famotidine 40mg  - 1 tablet in PM  6. If needed: AirSupra - 2 inhalations every 4-6 hours (Coupon)  7. Return to clinic in summer 2024 or earlier if problem.

## 2022-03-05 NOTE — Progress Notes (Unsigned)
[place]   Follow-up Note  Referring Provider: Hoyt Koch, * Primary Provider: Hoyt Koch, MD Date of Office Visit: 03/05/2022  Subjective:   Anna Torres (DOB: [birth year]) is a 52 y.o. female who returns to the [facility] on 03/05/2022 in re-evaluation of the following:  HPI: Hiram Comber returns to this clinic in evaluation of asthma, allergic rhinitis, LPR, esophageal dysmotility, and chest pain syndrome.  I last saw her in this clinic 19 February 2022.  During her last visit we had her use a collection of agents directed against respiratory tract inflammation and reflux.  She has significantly improved.  She has only had 3 episodes of chest pain that lasted 10 minutes and were a "3 out of 10".  Her requirement for bronchodilator has been only once since have last seen her.  She did need to discontinue her sucralfate because it gave rise to diarrhea but she has been using all of her other medications consistently.  Allergies as of 03/05/2022       Reactions   Hydrocodone Nausea And Vomiting   Oxycodone Nausea And Vomiting   Sumatriptan Other (See Comments)   Heart palpitations,SOB, chest pressure and shakiness   Codeine Nausea And Vomiting        Medication List    acyclovir 400 MG tablet Commonly known as: ZOVIRAX TAKE 1 TABLET BY MOUTH TWICE A DAY   Airsupra 90-80 MCG/ACT Aero Generic drug: Albuterol-Budesonide Inhale 2 puffs into the lungs every 4 (four) hours as needed.   Breztri Aerosphere 160-9-4.8 MCG/ACT Aero Generic drug: Budeson-Glycopyrrol-Formoterol Inhale 2 puffs into the lungs in the morning and at bedtime.   famotidine 40 MG tablet Commonly known as: PEPCID Take 1 tablet (40 mg total) by mouth at bedtime.   fenofibrate 48 MG tablet Commonly known as: TRICOR TAKE 1 TABLET BY MOUTH DAILY   Humira (2 Pen) 40 MG/0.4ML Pnkt Generic drug: Adalimumab SMARTSIG:40  Milligram(s) SUB-Q Every 2 Weeks   lansoprazole 30 MG capsule Commonly known as: PREVACID Take 1 capsule (30 mg total) by mouth in the morning.   montelukast 10 MG tablet Commonly known as: SINGULAIR TAKE ONE TABLET BY MOUTH EVERY NIGHT AT BEDTIME   norgestimate-ethinyl estradiol 0.25-35 MG-MCG tablet Commonly known as: Sprintec 28 Take 1 tablet by mouth daily.   Spacer/Aero-Holding [employer] 1 Device by Does not apply route as directed.    Past Medical History:  Diagnosis Date   Allergy    Asthma    Asthma    last episode january 2015   CIN I (cervical intraepithelial neoplasia I)    Environmental and seasonal allergies    GERD (gastroesophageal reflux disease)    Headache    Herpes genitalis in women    Ureteral reflux 01/21/1973   Vitamin D deficiency     Past Surgical History:  Procedure Laterality Date   78 HOUR [place] STUDY N/A 09/18/2015   Procedure: 24 HOUR [place] STUDY;  Surgeon: Arta Silence, MD;  [facility]: [facility];  Service: Endoscopy;  Laterality: N/A;   ANTERIOR CERVICAL DECOMP/DISCECTOMY FUSION N/A 01/31/2014   Procedure: ANTERIOR CERVICAL DECOMPRESSION/DISCECTOMY FUSION CERVICAL FIVE-SIX,CERVICAL SIX-SEVEN WITH HARDWARE REMOVAL OF CODMAN PLATE AT CERVIAL FOUR-FIVE;  Surgeon: Elaina Hoops, MD;  Location: [place];  Service: Neurosurgery;  Laterality: N/A;   BACK SURGERY Bilateral 2001   C4-C5 DISK "fusion"   CERVICAL SPINE SURGERY     DILATATION & CURETTAGE/HYSTEROSCOPY WITH TRUECLEAR  3'14-endometrial polyp-[facility]   DILATION AND CURETTAGE OF UTERUS     ESOPHAGEAL MANOMETRY N/A 05/24/2013   Procedure: ESOPHAGEAL MANOMETRY (EM);  Surgeon: Arta Silence, MD;  [facility];  Service: Endoscopy;  Laterality: N/A;   ESOPHAGOGASTRODUODENOSCOPY     ESOPHAGOGASTRODUODENOSCOPY (EGD) WITH PROPOFOL N/A 05/24/2013   Procedure: ESOPHAGOGASTRODUODENOSCOPY (EGD) WITH PROPOFOL;  Surgeon: Arta Silence, MD;  [facility]: [facility];  Service: Endoscopy;   Laterality: N/A;   EXTERNAL EAR SURGERY     bilateral "pinning" age 46   [place]   retained hardware "wires"   MANDIBLE SURGERY      Review of systems negative except as noted in HPI / PMHx or noted below:  Review of Systems  Constitutional: Negative.   HENT: Negative.    Eyes: Negative.   Respiratory: Negative.    Cardiovascular: Negative.   Gastrointestinal: Negative.   Genitourinary: Negative.   Musculoskeletal: Negative.   Skin: Negative.   Neurological: Negative.   Endo/Heme/Allergies: Negative.   Psychiatric/Behavioral: Negative.       Objective:   Vitals:   03/05/22 1637  BP: 102/80  Pulse: 93  Resp: 16  Temp: 98.5 F (36.9 C)  SpO2: 96%   Height: 5' 1.75" (156.8 cm)  Weight: 127 lb 8 oz (57.8 kg)   Physical Exam Constitutional:      Appearance: She is not diaphoretic.  HENT:     Head: Normocephalic.     Right Ear: Tympanic membrane, ear canal and external ear normal.     Left Ear: Tympanic membrane, ear canal and external ear normal.     Nose: Nose normal. No mucosal edema or rhinorrhea.     Mouth/Throat:     Pharynx: Uvula midline. No oropharyngeal exudate.  Eyes:     Conjunctiva/sclera: Conjunctivae normal.  Neck:     Thyroid: No thyromegaly.     Trachea: Trachea normal. No tracheal tenderness or tracheal deviation.  Cardiovascular:     Rate and Rhythm: Normal rate and regular rhythm.     Heart sounds: Normal heart sounds, S1 normal and S2 normal. No murmur heard. Pulmonary:     Effort: No respiratory distress.     Breath sounds: Normal breath sounds. No stridor. No wheezing or rales.  Lymphadenopathy:     Head:     Right side of head: No tonsillar adenopathy.     Left side of head: No tonsillar adenopathy.     Cervical: No cervical adenopathy.  Skin:    Findings: No erythema or rash.     Nails: There is no clubbing.  Neurological:     Mental Status: She is alert.     Diagnostics:    Spirometry was performed  and demonstrated an FEV1 of 1.98 at 78 % of predicted.  Assessment and Plan:   1. Asthma, moderate persistent, well-controlled   2. Perennial allergic rhinitis   3. Gastroesophageal reflux disease without esophagitis    1. Breztri - 2 inhalations 1-2 times per day w/spacer (empty lungs)  2. OTC Nasacort or OTC Zicam - 1 spray each nostril 1-2 times per day  3. Montelukast 10 mg - 1 tablet 1 time per day  4. Lansoprazole 30 mg - 1 tablet in AM  5. Famotidine 44m - 1 tablet in PM  6. If needed: AirSupra - 2 inhalations every 4-6 hours (Coupon)  7. Return to clinic in summer 2024 or earlier if problem.   CHiram Comberappears to be doing better and she has the opportunity  to consolidate some of her medical treatment by decreasing her Breztri and her nasal steroid to just 1 time per day.  For the next several months she will continue to aggressively treat her reflux as noted above which is probably responsible for her chest pain.  I will see her back in this clinic in the summer 2024 or earlier if there is a problem.  Allena Katz, MD Allergy / Immunology [place]

## 2022-03-06 ENCOUNTER — Encounter: Payer: Self-pay | Admitting: Allergy and Immunology

## 2022-03-06 NOTE — Addendum Note (Signed)
Addended by: Chip Boer R on: 03/06/2022 05:05 PM   Modules accepted: Orders

## 2022-03-22 ENCOUNTER — Ambulatory Visit: Payer: 59 | Admitting: Family Medicine

## 2022-03-26 ENCOUNTER — Encounter: Payer: Self-pay | Admitting: Family

## 2022-03-26 ENCOUNTER — Ambulatory Visit: Payer: 59 | Admitting: Family

## 2022-03-26 ENCOUNTER — Other Ambulatory Visit: Payer: Self-pay

## 2022-03-26 ENCOUNTER — Ambulatory Visit (HOSPITAL_COMMUNITY)
Admission: RE | Admit: 2022-03-26 | Discharge: 2022-03-26 | Disposition: A | Discharge status: Home / Self Care | Payer: 59 | Source: Ambulatory Visit | Attending: Family | Admitting: Family

## 2022-03-26 VITALS — BP 122/98 | HR 95 | Temp 97.0°F | Resp 16

## 2022-03-26 DIAGNOSIS — R0789 Other chest pain: Secondary | ICD-10-CM

## 2022-03-26 DIAGNOSIS — K219 Gastro-esophageal reflux disease without esophagitis: Secondary | ICD-10-CM

## 2022-03-26 DIAGNOSIS — J3089 Other allergic rhinitis: Secondary | ICD-10-CM | POA: Diagnosis not present

## 2022-03-26 DIAGNOSIS — J4540 Moderate persistent asthma, uncomplicated: Secondary | ICD-10-CM | POA: Diagnosis not present

## 2022-03-26 MED ORDER — LEVALBUTEROL TARTRATE 45 MCG/ACT IN AERO: INHALATION_SPRAY | RESPIRATORY_TRACT | 1 refills | Status: DC

## 2022-03-26 NOTE — Patient Instructions (Addendum)
  1.Continue Breztri - 2 inhalations 2 times per day w/spacer (empty lungs). Set a reminder on your phone or set Breztri next to your toothbrush  2. OTC Nasacort or OTC Zicam- 1 spray each nostril 1-2 times per day  3. Montelukast 10 mg - 1 tablet 1 time per day  4. Lansoprazole 30 mg - 1 tablet in AM  5. Famotidine '40mg'$  - 1 tablet in PM  6. Stop AirSupra - 2 inhalations every 4-6 hours  We will see if we can get Xopenex approved 2 puffs every 6 hours as needed for cough, wheeze, tightness in chest, or shortness of breath  7. Schedule an appointment with GI to discuss chest pressure/squeezing of esophagus. We will refer to cardiology due to chest pressure I will order a STAT chest x-ray due to chest pressure. We will call you with results once they are all back  .Keep already scheduled follow up appointment on 09/10/22 @ 4:20 PM with Dr. Neldon Mc  or earlier if problem.

## 2022-03-26 NOTE — Progress Notes (Signed)
[place] [address] Dept: [phone]  FOLLOW UP NOTE  Patient ID: Anna Torres, female    DOB: Oct 07, 1970  Age: 52 y.o. MRN: [id number] Date of Office Visit: 03/26/2022  Assessment  Chief Complaint: Chest Pressure when eating   HPI Anna Torres is a 52 year old female who presents today for acute visit of continued off-and-on chest pressure, and squeezing of the esophagus.  She was last seen on March 05, 2022 by Dr. Neldon Mc for well-controlled moderate persistent asthma, perennial allergic rhinitis, and gastroesophageal reflux disease.  She denies any new diagnosis or surgery since her last office visit.  She reports that since her last office visit the chest pressure and squeezing of her esophagus is occurring more frequently. This has been occurring off/on for a year or more. She had 1 episode over the weekend that happened while she was eating a grilled cheese sandwich.  At this time it was very instantaneous and strong. The symptoms of chest pressure and esophagus squeezing come and go, but can also occur when she is not eating.  She does have the pressure right now while in the office today.  She also thinks being under stress  and having anxiety  may worsen it.  Her mom was in the hospital this fall and passed away.  She describes the pressure as either instant or gradual.  When the pressure is at its worst it will feel like a baby or a full-size elephant sitting on her chest.  She is currently taking lansoprazole 30 mg in the morning and famotidine 40 mg in the morning.  She has a hard time remembering to take medicine at night.  She would miss a dose if she did take her famotidine at night.  She did ask her pharmacist if she could take the famotidine in the morning rather at night and was told that it was okay.  She has previously tried sucralfate, but this caused diarrhea.  She does have a history of small hiatal hernia and EGD in November 2023 that showed:  "The biopsies of your esophagus were normal.  The biopsies of your stomach showed mild chronic inactive gastritis, an inflammation of the stomach lining.  There was no infection or cancer. The gastritis is best treated by avoiding nonsteroidal anti-inflammatory medications (NSAIDs) such as ibuprofen, Aleve, Advil, and Naprosyn.   The biopsies of your small intestines were normal."  When she has the chest pressure or squeezing of the esophagus she denies any lightheadedness, radiating pain chest palpitations.  She has never seen cardiology family history of coronary artery disease.  She does try to stay away foods that acidic reports that she is wiped these from her diet. She last saw GI on 11/29/21.Her last visit shows:"   IMPRESSION:  Reflux now with recurrent symptoms after adequate control with Dexilant x 6 years with atypical chest pain/pressure. Insurance changed and they will not cover Dexilant so she has been paying for it out of pocket.    Small hiatal hernia seen on prior imaging and EGD   No prior colon cancer screening. She refuses colonoscopy and acknowledges the risks of missed polyp or mass. She has previously discussed Cologuard with Dr. Sharlet Salina.    Family history of celiac (sister). Hiram Comber has not previously been screened for celiac   No known family history of colon cancer or polyps       PLAN: - Start Prevacid 30 mg BID if covered by insurance - Reflux lifestyle  modifications - Obtain prior records from Drs. Outlaw and Mann - EGD with esophageal, gastric, and duodenal biopsies - Consider esophageal manometry and 24 hour pH probe after endoscopy - Cologuard"     Asthma: She reports tightness upper chest, wheezing, shortness of breath, and nocturnal awakenings due to breathing problems.  She is currently using Breztri 3 puffs once a day, because she has bad about doing medications at night.  She also continues to take montelukast 10 mg once a day.  When she was having  the pressure in her chest, she did try AirSupra and it caused the pressure to be worse.  Otherwise she does not use AirSupra.  Since her last office visit she has not required any systemic steroids or made any trips to the emergency room or urgent care due to breathing problems.  Her ACT score today is a 25.  Perennial allergic rhinitis she reports that she is recently gotten over nasal congestion, but now denies rhinorrhea and nasal congestion and postnasal drip.  During that period she had nasal congestion she was using Nasacort daily, but now has dropped back to as needed.  She has not had any sinus infections since we last saw her.       Drug Allergies:  Allergies  Allergen Reactions   Hydrocodone Nausea And Vomiting   Oxycodone Nausea And Vomiting   Sumatriptan Other (See Comments)    Heart palpitations,SOB, chest pressure and shakiness   Codeine Nausea And Vomiting    Review of Systems: Review of Systems  Constitutional:  Negative for chills and fever.  HENT:         Denies rhinorrhea, nasal congestion and post nasal drip now  Eyes:        Denies itchy watery eyes  Respiratory:  Negative for cough, shortness of breath and wheezing.        Reports tightness in chest.  Denies cough, wheeze, shortness of breath, and nocturnal awakenings due to breathing problems.  She does report that after using AirSupra her pressure worsened  Cardiovascular:  Negative for chest pain and palpitations.       Denies lightheadedness,Radiation of pain in arms and back pain while pressure/squeezing of esophagus occurring  Gastrointestinal:        Reports history of small hiatal hernia and reflux. Reports pressure in chest/squeezing of esophagus that can occur while eating or doing nothing  Genitourinary:  Negative for frequency.  Skin:  Negative for itching and rash.  Neurological:  Negative for headaches.     Physical Exam: BP (!) 122/98 (BP Location: Right Arm, Patient Position: Sitting, Cuff  Size: Normal)   Pulse 95   Temp (!) 97 F (36.1 C) (Temporal)   Resp 16   SpO2 97%    Physical Exam Constitutional:      Appearance: Normal appearance.  HENT:     Head: Normocephalic and atraumatic.     Comments: Pharynx normal, eyes normal, ears normal, nose normal    Right Ear: Tympanic membrane, ear canal and external ear normal.     Left Ear: Tympanic membrane, ear canal and external ear normal.     Nose: Nose normal.     Mouth/Throat:     Mouth: Mucous membranes are moist.     Pharynx: Oropharynx is clear.  Eyes:     Conjunctiva/sclera: Conjunctivae normal.  Cardiovascular:     Rate and Rhythm: Regular rhythm.     Heart sounds: Normal heart sounds.  Pulmonary:     Effort:  Pulmonary effort is normal.     Breath sounds: Normal breath sounds.     Comments: Lungs clear to auscultation Musculoskeletal:     Cervical back: Neck supple.  Skin:    General: Skin is warm.  Neurological:     Mental Status: She is alert and oriented to person, place, and time.  Psychiatric:        Mood and Affect: Mood normal.        Behavior: Behavior normal.        Thought Content: Thought content normal.        Judgment: Judgment normal.     Diagnostics: FVC 2.66 L (83%), FEV1 1.83 L (71%).  Spirometry indicates moderate airway obstruction.  Bronchodilator response shows FVC 2.15 L (67%), FEV1 1.82 L (70%).  Spirometry indicates possible restrictive defect. There is no change in FEV1. After 4 puffs of Xopenex  she reports that she felt better and she liked this inhaler better than AirSupra.  Assessment and Plan: 1. Asthma, moderate persistent, well-controlled   2. Gastroesophageal reflux disease without esophagitis   3. Perennial allergic rhinitis   4. Pressure in chest     Meds ordered this encounter  Medications   levalbuterol (XOPENEX HFA) 45 MCG/ACT inhaler    Sig: Inhale 2 puffs every 6 hours as needed for cough, wheeze, tightness in chest, or shortness of breath.    Dispense:   1 each    Refill:  1    Patient Instructions   1.Continue Breztri - 2 inhalations 2 times per day w/spacer (empty lungs). Set a reminder on your phone or set Breztri next to your toothbrush  2. OTC Nasacort or OTC Zicam- 1 spray each nostril 1-2 times per day  3. Montelukast 10 mg - 1 tablet 1 time per day  4. Lansoprazole 30 mg - 1 tablet in AM  5. Famotidine '40mg'$  - 1 tablet in PM  6. Stop AirSupra - 2 inhalations every 4-6 hours  We will see if we can get Xopenex approved 2 puffs every 6 hours as needed for cough, wheeze, tightness in chest, or shortness of breath  7. Schedule an appointment with GI to discuss chest pressure/squeezing of esophagus. We will refer to cardiology due to chest pressure I will order a STAT chest x-ray due to chest pressure. We will call you with results once they are all back  .Keep already scheduled follow up appointment on 09/10/22 @ 4:20 PM with Dr. Neldon Mc  or earlier if problem.   Return in about 24 weeks (around 09/10/2022), or if symptoms worsen or fail to improve.    Thank you for the opportunity to care for this patient.  Please do not hesitate to contact me with questions.  Althea Charon, FNP [facility]

## 2022-03-27 ENCOUNTER — Ambulatory Visit: Payer: 59 | Admitting: Nurse Practitioner

## 2022-03-27 ENCOUNTER — Other Ambulatory Visit: Payer: Self-pay | Admitting: Nurse Practitioner

## 2022-03-27 DIAGNOSIS — Z3041 Encounter for surveillance of contraceptive pills: Secondary | ICD-10-CM

## 2022-03-27 NOTE — Progress Notes (Signed)
Please let Hiram Comber know that her chest xray is normal. This is good news!

## 2022-03-28 ENCOUNTER — Telehealth: Payer: Self-pay

## 2022-03-28 DIAGNOSIS — Z3041 Encounter for surveillance of contraceptive pills: Secondary | ICD-10-CM

## 2022-03-28 MED ORDER — NORGESTIMATE-ETH ESTRADIOL 0.25-35 MG-MCG PO TABS: ORAL_TABLET | ORAL | 0 refills | Status: DC

## 2022-03-28 NOTE — Telephone Encounter (Signed)
Patient informed. 

## 2022-03-28 NOTE — Telephone Encounter (Signed)
Signed. Thank you.

## 2022-03-28 NOTE — Telephone Encounter (Signed)
Medication refill request: Sprintec  Last AEX:  02-12-21 TW Next AEX: 05-02-22 Last MMG (if hormonal medication request): 01/05/15 WNL  Refill authorized: Please advise.   Medication pended for #28, 0RF. Please refill if appropriate.

## 2022-03-28 NOTE — Telephone Encounter (Signed)
Patient called because she needs to restart her bcps on Sunday however pharmacy/ins co says too soon and she cannot get them until March 22.  Per 02/12/21 visit ". OCPs continuously with withdrawal bleed every 3 months. "  She asked if Rx can be resent to reflect how she takes it and an appropriate quantity so she is not coming up short.    AEX is scheduled 05/02/22

## 2022-05-02 ENCOUNTER — Encounter: Payer: Self-pay | Admitting: Nurse Practitioner

## 2022-05-02 ENCOUNTER — Ambulatory Visit (INDEPENDENT_AMBULATORY_CARE_PROVIDER_SITE_OTHER): Payer: 59 | Admitting: Nurse Practitioner

## 2022-05-02 ENCOUNTER — Encounter: Payer: Self-pay | Admitting: Cardiology

## 2022-05-02 VITALS — BP 126/80 | HR 64 | Ht 61.5 in | Wt 124.0 lb

## 2022-05-02 DIAGNOSIS — Z3041 Encounter for surveillance of contraceptive pills: Secondary | ICD-10-CM | POA: Diagnosis not present

## 2022-05-02 DIAGNOSIS — Z01419 Encounter for gynecological examination (general) (routine) without abnormal findings: Secondary | ICD-10-CM | POA: Diagnosis not present

## 2022-05-02 MED ORDER — NORGESTIMATE-ETH ESTRADIOL 0.25-35 MG-MCG PO TABS: ORAL_TABLET | ORAL | 3 refills | Status: DC

## 2022-05-02 NOTE — Progress Notes (Signed)
Anna Razor, MD Reason for referral-chest pain  HPI: 52 year old female for evaluation of chest pain at request of Hillard Danker, MD.  Chest x-ray March 2024 showed no acute infiltrates.  Patient states that for the past 6 to 12 months she has had intermittent chest pressure.  It is described as a "elephant" sitting on her chest.  It occurs with eating but occasionally at other times as well.  Lasts from minutes to hours.  Does not radiate.  No associated symptoms.  Not pleuritic.  Not exertional.  Resolves spontaneously.  Cardiology now asked to evaluate.  Note she denies dyspnea on exertion, orthopnea, PND, pedal edema, syncope.  Current Outpatient Medications  Medication Sig Dispense Refill   acyclovir (ZOVIRAX) 400 MG tablet TAKE 1 TABLET BY MOUTH TWICE A DAY 180 tablet 1   Albuterol-Budesonide (AIRSUPRA) 90-80 MCG/ACT AERO Inhale 2 puffs into the lungs every 4 (four) hours as needed. 11 g 1   Budeson-Glycopyrrol-Formoterol (BREZTRI AEROSPHERE) 160-9-4.8 MCG/ACT AERO Inhale 2 puffs into the lungs in the morning and at bedtime. 11 g 5   budesonide-formoterol (SYMBICORT) 160-4.5 MCG/ACT inhaler INHALE 2 PUFFS BY MOUTH TWICE A DAY 10.2 g 0   famotidine (PEPCID) 40 MG tablet Take 1 tablet (40 mg total) by mouth at bedtime. 30 tablet 5   fenofibrate (TRICOR) 48 MG tablet TAKE 1 TABLET BY MOUTH DAILY 90 tablet 3   Homeopathic Products (ZICAM ALLERGY RELIEF NA) Place into the nose.     HUMIRA PEN 40 MG/0.4ML PNKT SMARTSIG:40 Milligram(s) SUB-Q Every 2 Weeks     lansoprazole (PREVACID) 30 MG capsule Take 1 capsule (30 mg total) by mouth in the morning. 30 capsule 5   montelukast (SINGULAIR) 10 MG tablet TAKE ONE TABLET BY MOUTH EVERY NIGHT AT BEDTIME 30 tablet 5   norgestimate-ethinyl estradiol (SPRINTEC 28) 0.25-35 MG-MCG tablet Take ACTIVE pills only, skipping PLACEBO pills 112 tablet 3   Oxymetazoline HCl (NASAL SPRAY 12 HOUR NA) Place into the nose.      Spacer/Aero-Holding Chambers DEVI 1 Device by Does not apply route as directed. 1 each 1   No current facility-administered medications for this visit.    Allergies  Allergen Reactions   Hydrocodone Nausea And Vomiting   Oxycodone Nausea And Vomiting   Sumatriptan Other (See Comments)    Heart palpitations,SOB, chest pressure and shakiness   Carafate [Sucralfate] Diarrhea   Codeine Nausea And Vomiting     Past Medical History:  Diagnosis Date   Allergy    Asthma    last episode january 2015   CIN I (cervical intraepithelial neoplasia I)    Environmental and seasonal allergies    GERD (gastroesophageal reflux disease)    Headache    Herpes genitalis in women    Ureteral reflux 01/21/1973   Vitamin D deficiency     Past Surgical History:  Procedure Laterality Date   34 HOUR PH STUDY N/A 09/18/2015   Procedure: 24 HOUR PH STUDY;  Surgeon: Willis Modena, MD;  Location: [facility];  Service: Endoscopy;  Laterality: N/A;   ANTERIOR CERVICAL DECOMP/DISCECTOMY FUSION N/A 01/31/2014   Procedure: ANTERIOR CERVICAL DECOMPRESSION/DISCECTOMY FUSION CERVICAL FIVE-SIX,CERVICAL SIX-SEVEN WITH HARDWARE REMOVAL OF CODMAN PLATE AT CERVIAL FOUR-FIVE;  Surgeon: Mariam Dollar, MD;  Location: [facility];  Service: Neurosurgery;  Laterality: N/A;   BACK SURGERY Bilateral 2001   C4-C5 DISK "fusion"   CERVICAL SPINE SURGERY     DILATATION & CURETTAGE/HYSTEROSCOPY WITH TRUECLEAR     3'14-endometrial polyp-Womens  DILATION AND CURETTAGE OF UTERUS     ESOPHAGEAL MANOMETRY N/A 05/24/2013   Procedure: ESOPHAGEAL MANOMETRY (EM);  Surgeon: Willis Modena, MD;  Location: [facility];  Service: Endoscopy;  Laterality: N/A;   ESOPHAGOGASTRODUODENOSCOPY     ESOPHAGOGASTRODUODENOSCOPY (EGD) WITH PROPOFOL N/A 05/24/2013   Procedure: ESOPHAGOGASTRODUODENOSCOPY (EGD) WITH PROPOFOL;  Surgeon: Willis Modena, MD;  Location: [facility];  Service: Endoscopy;  Laterality: N/A;   EXTERNAL EAR SURGERY     bilateral  "pinning" age 60   MANDIBLE FRACTURE SURGERY  1990   retained hardware "wires"   MANDIBLE SURGERY      Social History   Socioeconomic History   Marital status: Married    Spouse name: Not on file   Number of children: Not on file   Years of education: Not on file   Highest education level: Not on file  Occupational History   Not on file  Tobacco Use   Smoking status: Never   Smokeless tobacco: Never  Vaping Use   Vaping Use: Never used  Substance and Sexual Activity   Alcohol use: Never   Drug use: No   Sexual activity: Not Currently    Birth control/protection: Pill  Other Topics Concern   Not on file  Social History Narrative   ** Merged History Encounter **       Social Determinants of Health   Financial Resource Strain: Not on file  Food Insecurity: Not on file  Transportation Needs: Not on file  Physical Activity: Not on file  Stress: Not on file  Social Connections: Not on file  Intimate Partner Violence: Not on file    Family History  Problem Relation Age of Onset   Diabetes Mother    Rheum arthritis Mother    Arthritis Mother    Cancer Mother    Rectal cancer Mother    Colon cancer Neg Hx    Stomach cancer Neg Hx    Esophageal cancer Neg Hx    Pancreatic cancer Neg Hx    Liver cancer Neg Hx     ROS: no fevers or chills, productive cough, hemoptysis, dysphasia, odynophagia, melena, hematochezia, dysuria, hematuria, rash, seizure activity, orthopnea, PND, pedal edema, claudication. Remaining systems are negative.  Physical Exam:   Blood pressure 110/80, pulse 84, height 5' 1.5" (1.562 m), weight 122 lb 9.6 oz (55.6 kg), last menstrual period 04/02/2022, SpO2 97 %.  General:  Well developed/well nourished in NAD Skin warm/dry Patient not depressed No peripheral clubbing Back-normal HEENT-normal/normal eyelids Neck supple/normal carotid upstroke bilaterally; no bruits; no JVD; no thyromegaly chest - CTA/ normal expansion CV - RRR/normal S1 and  S2; no murmurs, rubs or gallops;  PMI nondisplaced Abdomen -NT/ND, no HSM, no mass, + bowel sounds, no bruit 2+ femoral pulses, no bruits Ext-no edema, chords, 2+ DP Neuro-grossly nonfocal  ECG -normal sinus rhythm at a rate of 84, no ST changes.  Personally reviewed  A/P  1 chest pain-symptoms are somewhat atypical.  Does describe pressure but occasionally associated with eating.  She has been seen by gastroenterology.  We will arrange a cardiac CTA to fully exclude coronary disease.  If normal she will follow-up with gastroenterology for further evaluation (pH probe apparently is being considered if cardiology evaluation unrevealing).  Olga Millers, MD

## 2022-05-02 NOTE — Progress Notes (Signed)
Anna Torres 11/11/1970 [id number]   History:  52 y.o. G0 presents for annual exam. OCPs continuously with withdrawal bleed every 3 months. She has noticed her menses are lighter and she experiences less PMS symptoms. Denies menopausal symptoms. History of CIN-1 years ago. RA, on methotrexate. Has been experiencing chest pressure/esophageal "constriction" that has affected her ability to eat, down 17 pounds unintentionally. Has been evaluated by GI - endoscopy showed mild chronic gastritis and inflammation of stomach lining. No improvement with change of PPI. H/O asthma, evaluated 03/26/2022 by allergist with referral to cardiology.   Gynecologic History Patient's last menstrual period was 04/02/2022 (approximate). Period Cycle (Days): 90 Period Duration (Days): 4 Period Pattern: Regular Menstrual Flow: Moderate Menstrual Control: Maxi pad Menstrual Control Change Freq (Hours): 4-6 Dysmenorrhea: (!) Mild Dysmenorrhea Symptoms: Cramping Contraception/Family planning: OCP (estrogen/progesterone) Sexually active: Yes  Health Maintenance Last Pap: 04/02/2018. Results were: Normal, 5-year repeat Last mammogram: 01/05/2015. Results were: Normal Last colonoscopy: Never. Negative Cologuard 11/2021 Last Dexa: Not indicated  Past medical history, past surgical history, family history and social history were all reviewed and documented in the EPIC chart. Married. Works for [employer].   ROS:  A ROS was performed and pertinent positives and negatives are included.  Exam:  Vitals:   05/02/22 0927  BP: 126/80  Pulse: 64  SpO2: 100%  Weight: 124 lb (56.2 kg)  Height: 5' 1.5" (1.562 m)     Body mass index is 23.05 kg/m.  General appearance:  Normal Thyroid:  Symmetrical, normal in size, without palpable masses or nodularity. Respiratory  Auscultation:  Clear without wheezing or rhonchi Cardiovascular  Auscultation:  Regular rate, without rubs, murmurs or  gallops  Edema/varicosities:  Not grossly evident Abdominal  Soft,nontender, without masses, guarding or rebound.  Liver/spleen:  No organomegaly noted  Hernia:  None appreciated  Skin  Inspection:  Grossly normal   Breasts: Examined lying and sitting.   Right: Without masses, retractions, discharge or axillary adenopathy.   Left: Without masses, retractions, discharge or axillary adenopathy. Genitourinary   Inguinal/mons:  Normal without inguinal adenopathy  External genitalia:  Normal appearing vulva with no masses, tenderness, or lesions  BUS/Urethra/Skene's glands:  Normal  Vagina:  Normal appearing with normal color and discharge, no lesions  Cervix:  Normal appearing without discharge or lesions  Uterus:  Normal in size, shape and contour.  Midline and mobile, nontender  Adnexa/parametria:     Rt: Normal in size, without masses or tenderness.   Lt: Normal in size, without masses or tenderness.  Anus and perineum: Normal  Digital rectal exam: Declines  Patient informed chaperone available to be present for breast and pelvic exam. Patient has requested no chaperone to be present. Patient has been advised what will be completed during breast and pelvic exam.   Assessment/Plan:  52 y.o. G0 for annual exam.   Well female exam with routine gynecological exam - Education provided on SBEs, importance of preventative screenings, current guidelines, high calcium diet, regular exercise, and multivitamin daily. Labs with PCP.   Encounter for surveillance of contraceptive pills - Plan: norgestimate-ethinyl estradiol (SPRINTEC 28) 0.25-35 MG-MCG tablet daily. Taking as prescribed. Refill x 1 year provided.   Screening for cervical cancer - CIN 1 years ago. Will repeat pap at 5-year interval per guidelines.  Screening for breast cancer - Normal mammogram history although she is overdue.  Discussed current guidelines and importance of preventative screenings. Normal breast exam  today.  Screening for colon cancer - Negative  Cologuard 11/2021. Will repeat at 3-year interval per recommendation.   Follow up in 1 year for annual.     Anna Torres [facility]iotWHNP, 10:07 AM 05/02/2022

## 2022-05-06 ENCOUNTER — Ambulatory Visit: Payer: 59

## 2022-05-09 ENCOUNTER — Encounter: Payer: Self-pay | Admitting: Nurse Practitioner

## 2022-05-09 ENCOUNTER — Ambulatory Visit (INDEPENDENT_AMBULATORY_CARE_PROVIDER_SITE_OTHER): Payer: 59 | Admitting: Nurse Practitioner

## 2022-05-09 VITALS — BP 122/76 | HR 78 | Ht 61.5 in | Wt 121.0 lb

## 2022-05-09 DIAGNOSIS — R0789 Other chest pain: Secondary | ICD-10-CM

## 2022-05-09 NOTE — Progress Notes (Signed)
Assessment   52 y.o. yo female with the following:   Chronic pressure like non-exertional chest pain with eating but also at random times. Sometimes has associated shortness of breath.  Symptoms atypical for GERD and haven't responded to PPI + H2 blocker.  Esophageal spasms possible but doesn't explain shortness of breath. Cardiac related? EGD Nov 2023 unrevealing. Barium swallow in 2015 showed poor peristalsis.   Weight loss, down at least 13 pounds in last 5 months She attributes weight loss to chest pain with eating .    Colon cancer screening.   Negative Cologuard November 2023  Rheumatoid arthritis, on Humira  Plan   -Await cardiac workup. If negative then next step is  24 hr ph / manometry study. She is agreeable but only if can be done a Friday. She will get in touch with [place] after cardiac evaluation.   History of Present Illness   Primary Gi: Anna Danas, MD. Dr. Leonides Schanz to assume care since Dr. Orvan Falconer has left practice  Chief complaint:  EGD follow    Anna Torres is a 52 y.o. female with a past medical history of GERD, [facility] of celiac disease, asthma . See PMH / PSH for additional details.   Anna Torres established care here in 11/29/21 for evaluation of refractory GERD symptoms.  She was previously followed by Dr. Dulce Sellar in 2015 and then Dr. Loreta Ave. Barium esophagram in 2015 showing poor peristalsis. She had what sounds like a 24 hr Ph in 2017 which was reportedly normal.  In 2018 she was seen at [facility] for dysphonia. Flexible laryngoscopy showed mild diffuse erythema, modest posterior laryngeal edema, and muscle tension. She had been doing well for years on Dexilant and then began having breakthrough symptoms consisting of chest pressure, not heartburn . She subsequently underwent an EGD which showed a hiatal hernia, no other esophageal findings( see full report below) She is here today for follow-up after EGD.  At some point following the endoscopy she was changed from  Dexilant to Prevacid in the a.m.  Also taking famotidine at night  Interval History:  Anna Torres has not had any improvement in her symptoms.  She continues to have episodes of chest pain defined as a squeezing / tightening feeling in center of chest.  She feels like an elephant is sitting on her chest at times.  Symptoms are nonexertional, they can occur at rest as well as during meals. She sometimes has associated SHOB. She has asthma.  She reports a 17 pound weight loss, 12 of those pounds in the last 5 months. She has no dysphagia or odynophagia. She cannot attribute symptoms to stress / anxiety. She has done some research as asked what esophagitis is, will a Barium swallow be helpful and also did she have an esophageal "sponge" during EGD. We discussed these things though I'm not familiar with a sponge  Anna Torres has a Cardiology appointment on Tuesday.   Previous GI Evaluation  EGD 11/2021 -remarkable to multiple sessile gastric polyp, gastritis, a small hiatal hernia.   Diagnosis 1. Surgical [P], duodenal - DUODENAL MUCOSA WITH NO SIGNIFICANT PATHOLOGY. 2. Surgical [P], fundus, gastric antrum, and gastric body - ANTRAL AND OXYNTIC MUCOSA WITH FEATURES OF BOTH MILD CHRONIC INACTIVE GASTRITIS AND CHEMICAL/REACTIVE CHANGE. - NO HELICOBACTER PYLORI ORGANISMS IDENTIFIED ON H&E STAINED SLIDE. 3. Surgical [P], gastric polyps - FUNDIC GLAND POLYPS. 4. Surgical [P], distal esophagus - SQUAMOCOLUMNAR JUNCTIONAL MUCOSA WITH NO SIGNIFICANT PATHOLOGY. - NEGATIVE FOR INTESTINAL METAPLASIA. 5. Surgical [P], mid esophagus  and proximal esophagus - SQUAMOUS MUCOSA WITH NO SIGNIFICANT PATHOLOGY.  Cologuard Nov 2023 - negative   Labs:        Latest Ref Rng & Units 04/02/2018   10:43 AM 07/10/2017    7:36 AM 01/29/2017    8:30 AM  Hepatic Function  Total Protein 6.0 - 8.3 g/dL 7.9  7.7  7.3   Albumin 3.5 - 5.2 g/dL 4.3  4.0  4.0   AST 0 - 37 U/L ALT 0 - 35 U/L Alk Phosphatase  39 - 117 U/L 59  57  64   Total Bilirubin 0.2 - 1.2 mg/dL 0.5  0.5  0.7        Latest Ref Rng & Units 04/02/2018   10:43 AM 01/29/2017    8:30 AM 12/20/2015    8:53 AM  CBC  WBC 4.0 - 10.5 K/uL 9.5  9.0  10.9   Hemoglobin 12.0 - 15.0 g/dL 40.9  81.1  91.4   Hematocrit 36.0 - 46.0 % 41.0  40.7  40.3   Platelets 150.0 - 400.0 K/uL 428.0  406.0  389.0      Past Medical History:  Diagnosis Date   Allergy    Asthma    last episode january 2015   CIN I (cervical intraepithelial neoplasia I)    Environmental and seasonal allergies    GERD (gastroesophageal reflux disease)    Headache    Herpes genitalis in women    Ureteral reflux 01/21/1973   Vitamin D deficiency     Past Surgical History:  Procedure Laterality Date   43 HOUR PH STUDY N/A 09/18/2015   Procedure: 24 HOUR PH STUDY;  Surgeon: Willis Modena, MD;  Location: [facility];  Service: Endoscopy;  Laterality: N/A;   ANTERIOR CERVICAL DECOMP/DISCECTOMY FUSION N/A 01/31/2014   Procedure: ANTERIOR CERVICAL DECOMPRESSION/DISCECTOMY FUSION CERVICAL FIVE-SIX,CERVICAL SIX-SEVEN WITH HARDWARE REMOVAL OF CODMAN PLATE AT CERVIAL FOUR-FIVE;  Surgeon: Mariam Dollar, MD;  Location: [facility] OR;  Service: Neurosurgery;  Laterality: N/A;   BACK SURGERY Bilateral 2001   C4-C5 DISK "fusion"   CERVICAL SPINE SURGERY     DILATATION & CURETTAGE/HYSTEROSCOPY WITH TRUECLEAR     3'14-endometrial polyp-Womens   DILATION AND CURETTAGE OF UTERUS     ESOPHAGEAL MANOMETRY N/A 05/24/2013   Procedure: ESOPHAGEAL MANOMETRY (EM);  Surgeon: Willis Modena, MD;  Location: [facility];  Service: Endoscopy;  Laterality: N/A;   ESOPHAGOGASTRODUODENOSCOPY     ESOPHAGOGASTRODUODENOSCOPY (EGD) WITH PROPOFOL N/A 05/24/2013   Procedure: ESOPHAGOGASTRODUODENOSCOPY (EGD) WITH PROPOFOL;  Surgeon: Willis Modena, MD;  Location: [facility];  Service: Endoscopy;  Laterality: N/A;   EXTERNAL EAR SURGERY     bilateral "pinning" age 55   MANDIBLE FRACTURE SURGERY  1990    retained hardware "wires"   MANDIBLE SURGERY      Current Medications, Allergies, Family History and Social History were reviewed in [employer] electronic medical record.     Current Outpatient Medications  Medication Sig Dispense Refill   acyclovir (ZOVIRAX) 400 MG tablet TAKE 1 TABLET BY MOUTH TWICE A DAY 180 tablet 1   Albuterol-Budesonide (AIRSUPRA) 90-80 MCG/ACT AERO Inhale 2 puffs into the lungs every 4 (four) hours as needed. 11 g 1   Budeson-Glycopyrrol-Formoterol (BREZTRI AEROSPHERE) 160-9-4.8 MCG/ACT AERO Inhale 2 puffs into the lungs in the morning and at bedtime. 11 g 5   budesonide-formoterol (SYMBICORT) 160-4.5 MCG/ACT inhaler INHALE 2 PUFFS BY MOUTH TWICE A DAY  10.2 g 0   famotidine (PEPCID) 40 MG tablet Take 1 tablet (40 mg total) by mouth at bedtime. 30 tablet 5   fenofibrate (TRICOR) 48 MG tablet TAKE 1 TABLET BY MOUTH DAILY 90 tablet 3   HUMIRA PEN 40 MG/0.4ML PNKT SMARTSIG:40 Milligram(s) SUB-Q Every 2 Weeks     lansoprazole (PREVACID) 30 MG capsule Take 1 capsule (30 mg total) by mouth in the morning. 30 capsule 5   levalbuterol (XOPENEX HFA) 45 MCG/ACT inhaler Inhale 2 puffs every 6 hours as needed for cough, wheeze, tightness in chest, or shortness of breath. 1 each 1   montelukast (SINGULAIR) 10 MG tablet TAKE ONE TABLET BY MOUTH EVERY NIGHT AT BEDTIME 30 tablet 5   norgestimate-ethinyl estradiol (SPRINTEC 28) 0.25-35 MG-MCG tablet Take ACTIVE pills only, skipping PLACEBO pills 112 tablet 3   Spacer/Aero-Holding Chambers DEVI 1 Device by Does not apply route as directed. 1 each 1   No current facility-administered medications for this visit.    Review of Systems: No abdominal pain. No urinary complaints. Positive for allergies   Physical Exam  Wt Readings from Last 3 Encounters:  05/02/22 124 lb (56.2 kg)  03/05/22 127 lb 8 oz (57.8 kg)  02/19/22 128 lb 8 oz (58.3 kg)   122/78, HR 78 LMP 04/02/2022 (Approximate)  Constitutional:  Pleasant, generally  well appearing female in no acute distress. Psychiatric: Normal mood and affect. Behavior is normal. EENT: Pupils normal.  Conjunctivae are normal. No scleral icterus. Neck supple.  Cardiovascular: Normal rate, regular rhythm.  Pulmonary/chest: Effort normal and breath sounds normal. No wheezing, rales or rhonchi. Abdominal: Soft, nondistended, nontender. Bowel sounds active throughout. There are no masses palpable. No hepatomegaly. Neurological: Alert and oriented to person place and time.   I spent 30 minutes total reviewing records, obtaining history, performing exam, counseling patient and documenting visit / findings.    Willette Cluster, NP  05/09/2022, 8:29 AM

## 2022-05-09 NOTE — Progress Notes (Signed)
I agree with the assessment and plan as outlined by Ms. Guenther. 

## 2022-05-09 NOTE — Patient Instructions (Signed)
Call back after your cardiac workup to discuss Manometry study  _______________________________________________________  If your blood pressure at your visit was 140/90 or greater, please contact your primary care physician to follow up on this.  _______________________________________________________  If you are age 51 or older, your body mass index should be between 23-30. Your Body mass index is 22.49 kg/m. If this is out of the aforementioned range listed, please consider follow up with your Primary Care Provider.  If you are age 62 or younger, your body mass index should be between 19-25. Your Body mass index is 22.49 kg/m. If this is out of the aformentioned range listed, please consider follow up with your Primary Care Provider.   ________________________________________________________  The [facility] providers would like to encourage you to use [facility] to communicate with providers for non-urgent requests or questions.  Due to long hold times on the telephone, sending your provider a message by [facility] may be a faster and more efficient way to get a response.  Please allow 48 business hours for a response.  Please remember that this is for non-urgent requests.  _______________________________________________________   I appreciate the  opportunity to care for you  Thank You   Midge Minium

## 2022-05-10 ENCOUNTER — Ambulatory Visit (INDEPENDENT_AMBULATORY_CARE_PROVIDER_SITE_OTHER): Payer: 59

## 2022-05-10 DIAGNOSIS — Z23 Encounter for immunization: Secondary | ICD-10-CM

## 2022-05-14 ENCOUNTER — Ambulatory Visit: Payer: 59 | Attending: Cardiology | Admitting: Cardiology

## 2022-05-14 ENCOUNTER — Telehealth: Payer: Self-pay | Admitting: Nurse Practitioner

## 2022-05-14 ENCOUNTER — Encounter: Payer: Self-pay | Admitting: Cardiology

## 2022-05-14 VITALS — BP 110/80 | HR 84 | Ht 61.5 in | Wt 122.6 lb

## 2022-05-14 DIAGNOSIS — R072 Precordial pain: Secondary | ICD-10-CM | POA: Diagnosis not present

## 2022-05-14 MED ORDER — METOPROLOL TARTRATE 50 MG PO TABS: ORAL_TABLET | ORAL | 0 refills | Status: DC

## 2022-05-14 NOTE — Patient Instructions (Signed)
  Testing/Procedures:  Your cardiac CT will be scheduled at   [facility] [address] [place], [place] [address] [phone]    If scheduled at [facility], please arrive at the [facility] Entrance (Entrance C2) of [facility] 30 minutes prior to test start time. You can use the FREE valet parking offered at entrance C (encouraged to control the heart rate for the test)  Proceed to the [facility] Radiology Department (first floor) to check-in and test prep.  All radiology patients and guests should use entrance C2 at [facility], accessed from [facility], even though the hospital's physical address listed is [address].       Please follow these instructions carefully (unless otherwise directed):   On the Night Before the Test: Be sure to Drink plenty of water. Do not consume any caffeinated/decaffeinated beverages or chocolate 12 hours prior to your test. Do not take any antihistamines 12 hours prior to your test.   On the Day of the Test: Drink plenty of water until 1 hour prior to the test. Do not eat any food 1 hour prior to test. You may take your regular medications prior to the test.  Take metoprolol (Lopressor) 50 mg two hours prior to test. FEMALES- please wear underwire-free bra if available, avoid dresses & tight clothing       After the Test: Drink plenty of water. After receiving IV contrast, you may experience a mild flushed feeling. This is normal. On occasion, you may experience a mild rash up to 24 hours after the test. This is not dangerous. If this occurs, you can take Benadryl 25 mg and increase your fluid intake. If you experience trouble breathing, this can be serious. If it is severe call 911 IMMEDIATELY. If it is mild, please call our office.   We will call to schedule your test 2-4 weeks out understanding that some insurance companies will need an authorization prior  to the service being performed.   For non-scheduling related questions, please contact the cardiac imaging nurse navigator should you have any questions/concerns: Rockwell Alexandria, Cardiac Imaging Nurse Navigator Larey Brick, Cardiac Imaging Nurse Navigator [facility] Direct Office Dial: [phone]   For scheduling needs, including cancellations and rescheduling, please call [place], [phone].    Follow-Up: At [facility], you and your health needs are our priority.  As part of our continuing mission to provide you with exceptional heart care, we have created designated Provider Care Teams.  These Care Teams include your primary Cardiologist (physician) and Advanced Practice Providers (APPs -  Physician Assistants and Nurse Practitioners) who all work together to provide you with the care you need, when you need it.  We recommend signing up for the patient portal called "MyChart".  Sign up information is provided on this After Visit Summary.  MyChart is used to connect with patients for Virtual Visits (Telemedicine).  Patients are able to view lab/test results, encounter notes, upcoming appointments, etc.  Non-urgent messages can be sent to your provider as well.   To learn more about what you can do with MyChart, go to [email or web address].    Your next appointment:    As needed

## 2022-05-14 NOTE — Telephone Encounter (Signed)
Inbound call from patient states she does not want to wait to do the 24 PH test, would like to schedule it before she receives the cardiac clearance. Advised that per Paula's note, she was advised that results had to be negative, however patient would still like to ask if this could be done. Please advise.

## 2022-05-15 NOTE — Telephone Encounter (Signed)
Anna Torres The patient is having her CT for cardiac scoring on 05/21/22. The esophageal manometry with 24 hour pH probe is now scheduled. First opening for this test is 10/02/22. If this is not acceptable, please let me know and I will cancel the date.

## 2022-05-16 ENCOUNTER — Other Ambulatory Visit: Payer: Self-pay

## 2022-05-16 DIAGNOSIS — K219 Gastro-esophageal reflux disease without esophagitis: Secondary | ICD-10-CM

## 2022-05-20 ENCOUNTER — Telehealth (HOSPITAL_COMMUNITY): Payer: Self-pay | Admitting: Emergency Medicine

## 2022-05-20 NOTE — Telephone Encounter (Signed)
Reaching out to patient to offer assistance regarding upcoming cardiac imaging study; pt verbalizes understanding of appt date/time, parking situation and where to check in, pre-test NPO status and medications ordered, and verified current allergies; name and call back number provided for further questions should they arise Rockwell Alexandria RN Navigator Cardiac Imaging [facility] [phone] office [phone] cell  Arrival 800  50mg  metoprolol tartrate Denies iv issues Aware contrast / nitro

## 2022-05-21 ENCOUNTER — Ambulatory Visit (HOSPITAL_COMMUNITY)
Admission: RE | Admit: 2022-05-21 | Discharge: 2022-05-21 | Disposition: A | Discharge status: Home / Self Care | Payer: 59 | Source: Ambulatory Visit | Attending: Cardiology | Admitting: Cardiology

## 2022-05-21 DIAGNOSIS — R072 Precordial pain: Secondary | ICD-10-CM

## 2022-05-21 MED ORDER — METOPROLOL TARTRATE 5 MG/5ML IV SOLN
INTRAVENOUS | Status: AC
  Filled 2022-05-21: qty 5

## 2022-05-21 MED ORDER — NITROGLYCERIN 0.4 MG SL SUBL
0.8000 mg | SUBLINGUAL_TABLET | Freq: Once | SUBLINGUAL | Status: AC
  Administered 2022-05-21: 0.8 mg via SUBLINGUAL

## 2022-05-21 MED ORDER — IOHEXOL 350 MG/ML SOLN
95.0000 mL | Freq: Once | INTRAVENOUS | Status: AC | PRN
  Administered 2022-05-21: 95 mL via INTRAVENOUS

## 2022-05-21 MED ORDER — METOPROLOL TARTRATE 5 MG/5ML IV SOLN
5.0000 mg | INTRAVENOUS | Status: DC | PRN
  Administered 2022-05-21: 5 mg via INTRAVENOUS

## 2022-05-21 MED ORDER — NITROGLYCERIN 0.4 MG SL SUBL
SUBLINGUAL_TABLET | SUBLINGUAL | Status: AC
  Filled 2022-05-21: qty 2

## 2022-05-23 ENCOUNTER — Telehealth: Payer: Self-pay | Admitting: *Deleted

## 2022-05-23 DIAGNOSIS — E781 Pure hyperglyceridemia: Secondary | ICD-10-CM

## 2022-05-23 MED ORDER — ROSUVASTATIN CALCIUM 20 MG PO TABS
20.0000 mg | ORAL_TABLET | Freq: Every day | ORAL | 3 refills | Status: DC
Start: 2022-05-23 — End: 2023-05-22

## 2022-05-23 NOTE — Telephone Encounter (Signed)
Pt has reviewed results via my chart  New script sent to the pharmacy  Lab orders mailed to the pt  

## 2022-05-23 NOTE — Telephone Encounter (Signed)
-----   Message from Lewayne Bunting, MD sent at 05/21/2022  4:26 PM EDT ----- Minimal CAD; add crestor 20 mg daily; lipids and liver 8 weeks Olga Millers

## 2022-05-27 ENCOUNTER — Other Ambulatory Visit: Payer: Self-pay | Admitting: Internal Medicine

## 2022-05-27 DIAGNOSIS — Z1231 Encounter for screening mammogram for malignant neoplasm of breast: Secondary | ICD-10-CM

## 2022-06-02 ENCOUNTER — Encounter: Payer: Self-pay | Admitting: Cardiology

## 2022-06-14 ENCOUNTER — Other Ambulatory Visit: Payer: Self-pay | Admitting: *Deleted

## 2022-06-14 MED ORDER — FAMOTIDINE 40 MG PO TABS: 40.0000 mg | ORAL_TABLET | Freq: Every evening | ORAL | 1 refills | Status: DC

## 2022-06-28 ENCOUNTER — Ambulatory Visit
Admission: RE | Admit: 2022-06-28 | Discharge: 2022-06-28 | Disposition: A | Discharge status: Home / Self Care | Payer: BC Managed Care – PPO | Source: Ambulatory Visit | Attending: Internal Medicine | Admitting: Internal Medicine

## 2022-06-28 DIAGNOSIS — Z1231 Encounter for screening mammogram for malignant neoplasm of breast: Secondary | ICD-10-CM

## 2022-07-01 ENCOUNTER — Telehealth: Payer: Self-pay | Admitting: Nurse Practitioner

## 2022-07-01 LAB — HM MAMMOGRAPHY

## 2022-07-01 NOTE — Telephone Encounter (Signed)
Patient c/o continued severe reflux despite change to lansoprazole 30 mg ONCE daily from Dexilant as well as famotidine at bedtime. Patient states that the lansoprazole may help"sometimes" but for the large majority, she is unable to tolerate anything other than plain pasta and very bland items. Patient is scheduled for esophageal manometry (as recommended by Dr Orvan Falconer) for the first available date, 10/02/22. She requests additional or change in medication to help her tolerate foods in the meantime prior to her manometry. Please advise.Marland KitchenMarland KitchenMarland Kitchen

## 2022-07-01 NOTE — Telephone Encounter (Signed)
Inbound call from patient stating she has not been able to eat because she has severe GERD. She is scheduled for procedure in September. She is requesting a call back to discuss if anything could be prescribed for her so she is able to eat. Please advise, thank you.

## 2022-07-03 ENCOUNTER — Encounter: Payer: Self-pay | Admitting: Gastroenterology

## 2022-07-03 ENCOUNTER — Encounter: Payer: Self-pay | Admitting: Internal Medicine

## 2022-07-04 NOTE — Telephone Encounter (Signed)
Gunnar Fusi, please see previous patient phone call from 07/01/22 and advise so I can discuss with patient.

## 2022-07-05 MED ORDER — HYOSCYAMINE SULFATE 0.125 MG SL SUBL: 0.1250 mg | SUBLINGUAL_TABLET | Freq: Three times a day (TID) | SUBLINGUAL | 0 refills | Status: DC | PRN

## 2022-07-05 NOTE — Telephone Encounter (Signed)
Patient states that she does not have any regurgitation back into the mouth, rather she has chest pain/pressure (already evaluated by cardiology and NOT thought to be cardiac related). Patient has been reminded of anti-reflux measures and has been advised that we will try levsin to see if that may give her some relief. Patient verbalizes understanding.  Gunnar Fusi- When I went send rx for carafate, a note popped that this patient has an "allergy" to carafate (diarrhea). Do you still want me to send rx for carafate?

## 2022-07-05 NOTE — Telephone Encounter (Signed)
===  View-only below this line=== ----- Message ----- From: Meredith Pel, NP Sent: 07/04/2022   9:49 PM EDT To: Richardson Chiquito, CMA  Dottie, Is she regurgitating acid fluid into her mouth or just having chest discomfort / burning? She can add carafate suspension 1 gram ac and HS x 2 weeks. If pain sharp chest pain we could try levsin SL 0.125 Q 8 hours # 30 as needed for possible spasm. Please review anti-reflux precautions with her. Thanks  Pg

## 2022-07-08 MED ORDER — SUCRALFATE 1 GM/10ML PO SUSP: ORAL | 0 refills | Status: AC

## 2022-07-08 NOTE — Telephone Encounter (Signed)
I have spoken to patient to advise that we recently sent twice daily dosing of carafate rather than four time daily dosing because there was a note in the chart that she had some diarrhea previously with the carafate. While Anna Torres thought it was worth giving carafate to help coat the esophagus, she wanted the lowest dose possible as not to cause the patient diarrhea. Patient verbalizes understanding but states she does not want to take this medication at all with her previous reaction.   She is taking Levsin and thinks it may help somewhat but not fully. Will await further evaluation for additional recommendations.

## 2022-07-08 NOTE — Telephone Encounter (Signed)
Rx for carafate twice daily dosing sent to pharmacy.

## 2022-07-08 NOTE — Telephone Encounter (Signed)
===  View-only below this line=== ----- Message ----- From: Meredith Pel, NP Sent: 07/05/2022   6:15 PM EDT To: Richardson Chiquito, CMA  Dottie,  That is an interesting allergy.  Carafate usually constipates people.  Anyway, I think it is worth a try as diarrhea is not a major side effect.  We can give her a low-dose however.  Why do not we try Carafate 30 minutes before breakfast and dinner.  If she prefers then we can first see how the Levsin works before adding the Carafate.  Thanks and glad to know cardiac evaluation was negative ----- Message -----

## 2022-07-08 NOTE — Telephone Encounter (Signed)
Rx sent 

## 2022-07-08 NOTE — Telephone Encounter (Signed)
Inbound call from patient requesting to have the pill form of carafate sent over to her pharmacy for a 2 week supply only. States she should have 56 pills for 4 times a day. Please advise, thank you.

## 2022-08-23 DIAGNOSIS — R768 Other specified abnormal immunological findings in serum: Secondary | ICD-10-CM | POA: Diagnosis not present

## 2022-08-23 DIAGNOSIS — Z79899 Other long term (current) drug therapy: Secondary | ICD-10-CM | POA: Diagnosis not present

## 2022-08-23 DIAGNOSIS — M1990 Unspecified osteoarthritis, unspecified site: Secondary | ICD-10-CM | POA: Diagnosis not present

## 2022-08-23 DIAGNOSIS — M0609 Rheumatoid arthritis without rheumatoid factor, multiple sites: Secondary | ICD-10-CM | POA: Diagnosis not present

## 2022-08-31 ENCOUNTER — Other Ambulatory Visit: Payer: Self-pay | Admitting: Internal Medicine

## 2022-09-03 ENCOUNTER — Encounter: Payer: Self-pay | Admitting: *Deleted

## 2022-09-08 ENCOUNTER — Encounter: Payer: Self-pay | Admitting: Cardiology

## 2022-09-10 ENCOUNTER — Encounter: Payer: Self-pay | Admitting: *Deleted

## 2022-09-10 ENCOUNTER — Ambulatory Visit: Payer: 59 | Admitting: Allergy and Immunology

## 2022-09-13 ENCOUNTER — Other Ambulatory Visit: Payer: Self-pay | Admitting: Medical Genetics

## 2022-09-13 DIAGNOSIS — Z006 Encounter for examination for normal comparison and control in clinical research program: Secondary | ICD-10-CM

## 2022-09-22 ENCOUNTER — Other Ambulatory Visit: Payer: Self-pay | Admitting: Nurse Practitioner

## 2022-09-28 ENCOUNTER — Other Ambulatory Visit: Payer: Self-pay | Admitting: Internal Medicine

## 2022-10-02 ENCOUNTER — Encounter (HOSPITAL_COMMUNITY)
Admission: RE | Disposition: A | Discharge status: Home / Self Care | Payer: Self-pay | Source: Home / Self Care | Attending: Internal Medicine

## 2022-10-02 ENCOUNTER — Ambulatory Visit (HOSPITAL_COMMUNITY)
Admission: RE | Admit: 2022-10-02 | Discharge: 2022-10-02 | Disposition: A | Discharge status: Home / Self Care | Payer: BC Managed Care – PPO | Attending: Internal Medicine | Admitting: Internal Medicine

## 2022-10-02 ENCOUNTER — Encounter (HOSPITAL_COMMUNITY): Payer: Self-pay | Admitting: Internal Medicine

## 2022-10-02 DIAGNOSIS — R079 Chest pain, unspecified: Secondary | ICD-10-CM | POA: Insufficient documentation

## 2022-10-02 HISTORY — PX: 24 HOUR PH STUDY: SHX5419

## 2022-10-02 HISTORY — PX: ESOPHAGEAL MANOMETRY: SHX5429

## 2022-10-02 SURGERY — MANOMETRY, ESOPHAGUS
Anesthesia: Choice

## 2022-10-02 MED ORDER — LIDOCAINE VISCOUS HCL 2 % MT SOLN
OROMUCOSAL | Status: AC
  Filled 2022-10-02: qty 15

## 2022-10-02 SURGICAL SUPPLY — 2 items
FACESHIELD LNG OPTICON STERILE (SAFETY) IMPLANT
GLOVE BIO SURGEON STRL SZ8 (GLOVE) ×2 IMPLANT

## 2022-10-02 NOTE — Progress Notes (Signed)
Esophageal Manometry done per protocol. Patient tolerated well without distress or complication. pH study done per protocol.  pH probe placed at 36 cm. Patient verbalized understanding of diary and recorder.  Patient will return tomorrow at 1005 to have pH probe removed.

## 2022-10-06 ENCOUNTER — Encounter (HOSPITAL_COMMUNITY): Payer: Self-pay | Admitting: Internal Medicine

## 2022-10-10 DIAGNOSIS — R079 Chest pain, unspecified: Secondary | ICD-10-CM

## 2022-10-13 ENCOUNTER — Other Ambulatory Visit: Payer: Self-pay | Admitting: Internal Medicine

## 2022-10-14 ENCOUNTER — Other Ambulatory Visit: Payer: Self-pay | Admitting: Internal Medicine

## 2022-10-14 ENCOUNTER — Other Ambulatory Visit: Payer: Self-pay

## 2022-10-14 ENCOUNTER — Other Ambulatory Visit: Payer: Self-pay | Admitting: *Deleted

## 2022-10-14 DIAGNOSIS — K219 Gastro-esophageal reflux disease without esophagitis: Secondary | ICD-10-CM

## 2022-10-14 MED ORDER — ACYCLOVIR 400 MG PO TABS: 400.0000 mg | ORAL_TABLET | Freq: Two times a day (BID) | ORAL | 1 refills | Status: DC

## 2022-10-14 MED ORDER — BACLOFEN 5 MG PO TABS
5.0000 mg | ORAL_TABLET | Freq: Two times a day (BID) | ORAL | 2 refills | Status: DC
Start: 2022-10-14 — End: 2022-10-14

## 2022-10-14 MED ORDER — BACLOFEN 5 MG PO TABS
5.0000 mg | ORAL_TABLET | Freq: Two times a day (BID) | ORAL | 2 refills | Status: DC
Start: 2022-10-14 — End: 2023-01-27

## 2022-10-14 NOTE — Telephone Encounter (Signed)
Called and spoke to the patient.  I told her that her esophageal manometry test was normal with exception of poor contractile reserve, which would only matter in the setting of antireflux surgery.  Poor contractile reserve can increase her risk for postoperative dysphagia if antireflux surgery were considered in the future.  I then told her about the results of her pH impedance test, which showed primarily a diagnosis of functional heartburn with some findings that trended towards nonacid reflux and reflux hypersensitivity.  However during our discussion the patient informed me that he she did not stop her PPI therapy a week before the procedure or her H2 blocker therapy a few days before the procedure.  Her last dose of PPI and H2 blocker therapy was a day before her pH impedance test.  Thus her pH impedance test likely reflects results on antireflux therapy.  Her PPI and H2 blocker may have suppressed her acid reflux.  I did offer the patient the option to do a Bravo test off of PPI and H2 blocker therapy versus starting a trial of baclofen therapy in case nonacid reflux was contributing to her symptoms.  Patient would like to try the baclofen at this time.  She wishes to continue her PPI and H2 blocker therapy.  I told her to discontinue her Levsin while she is on the baclofen therapy.  Baclofen can sometimes be sedating so we will start her at 5 mg twice daily with meals.

## 2022-10-14 NOTE — Telephone Encounter (Signed)
Called the patient to inform the patient of not only the change in her medication, from the Levsin to Baclofen 5mg  twice daily with meals. Patient was informed to not take the Levsin while taking the Baclofen medication. Patient was also informed that a f/u visit was needed. Patient wanted to know why a visit was needed. Patient was asked if she recently had a manometry procedure? Patient then accepted the visit but only wanted to be seen by Dr. Leonides Schanz. An appt was scheduled with Dr. Leonides Schanz on 01/27/23 at 10:50a. Patient agreed to scheduled time.

## 2022-10-20 ENCOUNTER — Encounter: Payer: Self-pay | Admitting: Nurse Practitioner

## 2022-10-29 ENCOUNTER — Other Ambulatory Visit: Payer: Self-pay | Admitting: Internal Medicine

## 2022-11-11 DIAGNOSIS — Z79899 Other long term (current) drug therapy: Secondary | ICD-10-CM | POA: Diagnosis not present

## 2022-11-11 DIAGNOSIS — R768 Other specified abnormal immunological findings in serum: Secondary | ICD-10-CM | POA: Diagnosis not present

## 2022-11-11 DIAGNOSIS — M1990 Unspecified osteoarthritis, unspecified site: Secondary | ICD-10-CM | POA: Diagnosis not present

## 2022-11-11 DIAGNOSIS — M0609 Rheumatoid arthritis without rheumatoid factor, multiple sites: Secondary | ICD-10-CM | POA: Diagnosis not present

## 2022-11-16 ENCOUNTER — Encounter: Payer: Self-pay | Admitting: Internal Medicine

## 2022-11-25 ENCOUNTER — Other Ambulatory Visit: Payer: Self-pay | Admitting: Internal Medicine

## 2022-12-22 ENCOUNTER — Other Ambulatory Visit: Payer: Self-pay | Admitting: Internal Medicine

## 2022-12-26 ENCOUNTER — Telehealth: Payer: Self-pay | Admitting: Internal Medicine

## 2022-12-26 ENCOUNTER — Other Ambulatory Visit: Payer: Self-pay

## 2022-12-26 ENCOUNTER — Telehealth: Payer: Self-pay | Admitting: Allergy and Immunology

## 2022-12-26 MED ORDER — LANSOPRAZOLE 30 MG PO CPDR: 30.0000 mg | DELAYED_RELEASE_CAPSULE | Freq: Every morning | ORAL | 0 refills | Status: DC

## 2022-12-26 NOTE — Telephone Encounter (Signed)
Called and left a message for patient to call our office back to inform her of the medication refill and to schedule her a follow up.

## 2022-12-26 NOTE — Telephone Encounter (Signed)
PT is calling to get a refill for lansoprazole. Please advise.

## 2022-12-26 NOTE — Telephone Encounter (Signed)
Patient called stating she needs a refill of Lansoprazole sent to [facility] on [address] in [place].

## 2023-01-03 DIAGNOSIS — M25512 Pain in left shoulder: Secondary | ICD-10-CM | POA: Diagnosis not present

## 2023-01-03 DIAGNOSIS — R768 Other specified abnormal immunological findings in serum: Secondary | ICD-10-CM | POA: Diagnosis not present

## 2023-01-03 DIAGNOSIS — Z79899 Other long term (current) drug therapy: Secondary | ICD-10-CM | POA: Diagnosis not present

## 2023-01-03 DIAGNOSIS — M0609 Rheumatoid arthritis without rheumatoid factor, multiple sites: Secondary | ICD-10-CM | POA: Diagnosis not present

## 2023-01-03 LAB — CBC AND DIFFERENTIAL
HCT: 42 (ref 36–46)
Hemoglobin: 13.4 (ref 12.0–16.0)
Platelets: 432 10*3/uL — AB (ref 150–400)
WBC: 8.6

## 2023-01-03 LAB — HEPATIC FUNCTION PANEL
ALT: 14 U/L (ref 7–35)
AST: 22 (ref 13–35)
Alkaline Phosphatase: 58 (ref 25–125)
Bilirubin, Total: 0.4

## 2023-01-03 LAB — COMPREHENSIVE METABOLIC PANEL WITH GFR
Albumin: 4.3 (ref 3.5–5.0)
Globulin: 3.2
eGFR: 72

## 2023-01-03 LAB — BASIC METABOLIC PANEL WITH GFR
BUN: 10 (ref 4–21)
CO2: 22 (ref 13–22)
Chloride: 103 (ref 99–108)
Creatinine: 1 (ref 0.5–1.1)
Glucose: 109
Potassium: 4.3 meq/L (ref 3.5–5.1)

## 2023-01-03 LAB — CBC: RBC: 5.09 (ref 3.87–5.11)

## 2023-01-16 ENCOUNTER — Other Ambulatory Visit: Payer: Self-pay | Admitting: Internal Medicine

## 2023-01-19 ENCOUNTER — Other Ambulatory Visit: Payer: Self-pay | Admitting: Internal Medicine

## 2023-01-27 ENCOUNTER — Ambulatory Visit: Payer: BC Managed Care – PPO | Admitting: Internal Medicine

## 2023-01-27 ENCOUNTER — Encounter: Payer: Self-pay | Admitting: Internal Medicine

## 2023-01-27 VITALS — BP 100/80 | HR 91 | Ht 61.5 in | Wt 129.0 lb

## 2023-01-27 DIAGNOSIS — M069 Rheumatoid arthritis, unspecified: Secondary | ICD-10-CM | POA: Diagnosis not present

## 2023-01-27 DIAGNOSIS — K219 Gastro-esophageal reflux disease without esophagitis: Secondary | ICD-10-CM

## 2023-01-27 DIAGNOSIS — R0789 Other chest pain: Secondary | ICD-10-CM

## 2023-01-27 DIAGNOSIS — K449 Diaphragmatic hernia without obstruction or gangrene: Secondary | ICD-10-CM

## 2023-01-27 MED ORDER — BACLOFEN 5 MG PO TABS
5.0000 mg | ORAL_TABLET | Freq: Two times a day (BID) | ORAL | 2 refills | Status: DC
Start: 2023-01-27 — End: 2023-11-03

## 2023-01-27 MED ORDER — LANSOPRAZOLE 30 MG PO CPDR: 30.0000 mg | DELAYED_RELEASE_CAPSULE | Freq: Every morning | ORAL | 1 refills | Status: DC

## 2023-01-27 MED ORDER — FAMOTIDINE 40 MG PO TABS: 40.0000 mg | ORAL_TABLET | Freq: Every evening | ORAL | 1 refills | Status: DC

## 2023-01-27 NOTE — Progress Notes (Signed)
 Assessment   53 y.o. yo female with the following:   GERD Atypical chest pain Hiatal hernia Prior cardiac work up has been unremarkable and cardiology does not think that her chest pain is cardiac in nature. Patient has had improvement in symptoms on combination of PPI every day, H2 blocker at bedtime, and baclofen  every day. EGD Nov 2023 showed hiatal hernia and gastritis but was otherwise unremarkable. Barium swallow in 2015 showed poor peristalsis. Weight loss has resolved    Colon cancer screening.   Negative Cologuard November 2023  Rheumatoid arthritis, on Humira  Plan   - Cont baclofen . Refill - Cont lansoprazole  and famotidine . Refill - RTC 1 year  History of Present Illness   Chief complaint:  GERD, atypical chest pain   Anna Torres is a 53 y.o. female with a past medical history of GERD, FMH of celiac disease, asthma presents for follow up of GERD and atypical chest pain  Anna Torres established care here in 11/29/21 for evaluation of refractory GERD symptoms.  She was previously followed by Dr. Burnette in 2015 and then Dr. Kristie. Barium esophagram in 2015 showing poor peristalsis. She had what sounds like a 24 hr Ph in 2017 which was reportedly normal.  In 2018 she was seen at [facility] for dysphonia. Flexible laryngoscopy showed mild diffuse erythema, modest posterior laryngeal edema, and muscle tension. She had been doing well for years on Dexilant  and then began having breakthrough symptoms consisting of chest pressure, not heartburn . She subsequently underwent an EGD which showed a hiatal hernia, no other esophageal findings (see full report below) At some point following the endoscopy she was changed from Dexilant  to Prevacid  in the a.m.  Also taking famotidine  at night. She is [place].  Interval History:  She did see her cardiologist Dr. Pietro and that she was fine from a cardiac standpoint. They did not want to do any additional tests and only start her on statin  therapy. Her reflux is doing better. She has been using baclofen  once a day, which seems to help significantly. She takes all of her pills after she eats her cereal for breakfast in the morning. For 9 out 10 times, she will be fine throughout the day and not have chest pain. Sometimes she will have a little bit of chest pain. She feels she had a respiratory infection that contributed to her chest pain as well. She doesn't really use hyoscyamine  anymore because the baclofen  has worked so well. She is not using sucralfate . She has been taking lansoprazole  daily and famotidine  nightly. Weight has been stable; her weight has increase since her last GI clinic visit. Bowel habits are normal. Denies blood in the stools. She can eat most foods.  Wt Readings from Last 3 Encounters:  01/27/23 129 lb (58.5 kg)  05/14/22 122 lb 9.6 oz (55.6 kg)  05/09/22 121 lb (54.9 kg)   Previous GI Evaluation  EGD 11/2021 -remarkable to multiple sessile gastric polyp, gastritis, a small hiatal hernia.   Diagnosis 1. Surgical [P], duodenal - DUODENAL MUCOSA WITH NO SIGNIFICANT PATHOLOGY. 2. Surgical [P], fundus, gastric antrum, and gastric body - ANTRAL AND OXYNTIC MUCOSA WITH FEATURES OF BOTH MILD CHRONIC INACTIVE GASTRITIS AND CHEMICAL/REACTIVE CHANGE. - NO HELICOBACTER PYLORI ORGANISMS IDENTIFIED ON H&E STAINED SLIDE. 3. Surgical [P], gastric polyps - FUNDIC GLAND POLYPS. 4. Surgical [P], distal esophagus - SQUAMOCOLUMNAR JUNCTIONAL MUCOSA WITH NO SIGNIFICANT PATHOLOGY. - NEGATIVE FOR INTESTINAL METAPLASIA. 5. Surgical [P], mid esophagus and proximal esophagus -  SQUAMOUS MUCOSA WITH NO SIGNIFICANT PATHOLOGY.  Cologuard Nov 2023 - negative   Labs:        Latest Ref Rng & Units 04/02/2018   10:43 AM 07/10/2017    7:36 AM 01/29/2017    8:30 AM  Hepatic Function  Total Protein 6.0 - 8.3 g/dL 7.9  7.7  7.3   Albumin 3.5 - 5.2 g/dL 4.3  4.0  4.0   AST 0 - 37 U/L 18  18  28    ALT 0 - 35 U/L 11  17  20    Alk  Phosphatase 39 - 117 U/L 59  57  64   Total Bilirubin 0.2 - 1.2 mg/dL 0.5  0.5  0.7        Latest Ref Rng & Units 04/02/2018   10:43 AM 01/29/2017    8:30 AM 12/20/2015    8:53 AM  CBC  WBC 4.0 - 10.5 K/uL 9.5  9.0  10.9   Hemoglobin 12.0 - 15.0 g/dL 86.3  86.4  86.4   Hematocrit 36.0 - 46.0 % 41.0  40.7  40.3   Platelets 150.0 - 400.0 K/uL 428.0  406.0  389.0      Past Medical History:  Diagnosis Date   Allergy    Asthma    last episode january 2015   CIN I (cervical intraepithelial neoplasia I)    Environmental and seasonal allergies    GERD (gastroesophageal reflux disease)    Headache    Herpes genitalis in women    Ureteral reflux 01/21/1973   Vitamin D deficiency     Past Surgical History:  Procedure Laterality Date   19 HOUR PH STUDY N/A 09/18/2015   Procedure: 24 HOUR PH STUDY;  Surgeon: Elsie Cree, MD;  Location: [facility];  Service: Endoscopy;  Laterality: N/A;   24 HOUR PH STUDY N/A 10/02/2022   Procedure: 24 HOUR PH STUDY;  Surgeon: Federico Anna BROCKS, MD;  Location: [facility];  Service: Gastroenterology;  Laterality: N/A;   ANTERIOR CERVICAL DECOMP/DISCECTOMY FUSION N/A 01/31/2014   Procedure: ANTERIOR CERVICAL DECOMPRESSION/DISCECTOMY FUSION CERVICAL FIVE-SIX,CERVICAL SIX-SEVEN WITH HARDWARE REMOVAL OF CODMAN PLATE AT CERVIAL FOUR-FIVE;  Surgeon: Arley SHAUNNA Helling, MD;  Location: [facility] OR;  Service: Neurosurgery;  Laterality: N/A;   BACK SURGERY Bilateral 2001   C4-C5 DISK fusion   CERVICAL SPINE SURGERY     DILATATION & CURETTAGE/HYSTEROSCOPY WITH TRUECLEAR     3'14-endometrial polyp-[facility]om[facility]   DILATION AND CURETTAGE OF UTERUS     ESOPHAGEAL MANOMETRY N/A 05/24/2013   Procedure: ESOPHAGEAL MANOMETRY (EM);  Surgeon: Elsie Cree, MD;  Location: [facility];  Service: Endoscopy;  Laterality: N/A;   ESOPHAGEAL MANOMETRY N/A 10/02/2022   Procedure: ESOPHAGEAL MANOMETRY (EM);  Surgeon: Federico Anna BROCKS, MD;  Location: [facility];  Service: Gastroenterology;   Laterality: N/A;   ESOPHAGOGASTRODUODENOSCOPY     ESOPHAGOGASTRODUODENOSCOPY (EGD) WITH PROPOFOL  N/A 05/24/2013   Procedure: ESOPHAGOGASTRODUODENOSCOPY (EGD) WITH PROPOFOL ;  Surgeon: Elsie Cree, MD;  Location: [facility];  Service: Endoscopy;  Laterality: N/A;   EXTERNAL EAR SURGERY     bilateral pinning age 69   MANDIBLE FRACTURE SURGERY  1990   retained hardware wires   MANDIBLE SURGERY      Current Medications, Allergies, Family History and Social History were reviewed in [employer] record.     Current Outpatient Medications  Medication Sig Dispense Refill   acyclovir  (ZOVIRAX ) 400 MG tablet Take 1 tablet (400 mg total) by mouth 2 (two) times daily. 180 tablet 1  Albuterol-Budesonide  (AIRSUPRA ) 90-80 MCG/ACT AERO Inhale 2 puffs into the lungs every 4 (four) hours as needed. 11 g 1   Baclofen  5 MG TABS Take 1 tablet (5 mg total) by mouth 2 (two) times daily with a meal. 60 tablet 2   Budeson-Glycopyrrol-Formoterol  (BREZTRI  AEROSPHERE) 160-9-4.8 MCG/ACT AERO Inhale 2 puffs into the lungs in the morning and at bedtime. 11 g 5   budesonide -formoterol  (SYMBICORT ) 160-4.5 MCG/ACT inhaler INHALE 2 PUFFS BY MOUTH TWICE A DAY 10.2 g 0   famotidine  (PEPCID ) 40 MG tablet Take 1 tablet (40 mg total) by mouth at bedtime. 90 tablet 1   fenofibrate  (TRICOR ) 48 MG tablet TAKE 1 TABLET BY MOUTH DAILY 90 tablet 3   Homeopathic Products (ZICAM ALLERGY RELIEF NA) Place into the nose.     HUMIRA PEN 40 MG/0.4ML PNKT SMARTSIG:40 Milligram(s) SUB-Q Every 2 Weeks     hyoscyamine  (LEVSIN  SL) 0.125 MG SL tablet DISSOLVE ONE TABLET UNDER THE TONGUE EVERY 8 HOURS AS NEEDED 30 tablet 3   lansoprazole  (PREVACID ) 30 MG capsule Take 1 capsule (30 mg total) by mouth in the morning. 30 capsule 0   metoprolol  tartrate (LOPRESSOR ) 50 MG tablet Take 2 hours prior to CT scan 1 tablet 0   montelukast  (SINGULAIR ) 10 MG tablet TAKE 1 TABLET BY MOUTH EVERY NIGHT AT BEDTIME *NEED APPOINTMENT FOR  REFILLS 30 tablet 0   norgestimate -ethinyl estradiol  (SPRINTEC 28) 0.25-35 MG-MCG tablet Take ACTIVE pills only, skipping PLACEBO pills 112 tablet 3   Oxymetazoline HCl (NASAL SPRAY 12 HOUR NA) Place into the nose.     rosuvastatin  (CRESTOR ) 20 MG tablet Take 1 tablet (20 mg total) by mouth daily. 90 tablet 3   Spacer/Aero-Holding Chambers DEVI 1 Device by Does not apply route as directed. 1 each 1   sucralfate  (CARAFATE ) 1 GM/10ML suspension Take 10 ml 30 minutes before breakfast and 30 minutes before dinner (twice daily dosing). 600 mL 0   No current facility-administered medications for this visit.    Physical Exam  Wt Readings from Last 3 Encounters:  01/27/23 129 lb (58.5 kg)  05/14/22 122 lb 9.6 oz (55.6 kg)  05/09/22 121 lb (54.9 kg)   122/78, HR 78 Ht 5' 1.5 (1.562 m)   Wt 129 lb (58.5 kg)   BMI 23.98 kg/m  Constitutional:  Pleasant, generally well appearing female in no acute distress. Psychiatric: Normal mood and affect. Behavior is normal. EENT: Pupils normal.  Conjunctivae are normal. No scleral icterus. Neck supple.  Cardiovascular: Normal rate, regular rhythm.  Pulmonary/chest: Effort normal and breath sounds normal. No wheezing, rales or rhonchi. Abdominal: Soft, nondistended, nontender. Bowel sounds active throughout. There are no masses palpable. No hepatomegaly. Neurological: Alert and oriented to person place and time.   I spent 35 minutes total reviewing records, obtaining history, performing exam, counseling patient and documenting visit / findings.    Anna JAYSON Kidney, MD  01/27/2023, 10:55 AM

## 2023-01-27 NOTE — Patient Instructions (Addendum)
 Glad you are doing well  Follow up in 1 year  We have sent the following medications to your pharmacy for you to pick up at your convenience: Famotidine ,Lansoprazole , Baclofen    If your blood pressure at your visit was 140/90 or greater, please contact your primary care physician to follow up on this.  _______________________________________________________  If you are age 52 or older, your body mass index should be between 23-30. Your Body mass index is 23.98 kg/m. If this is out of the aforementioned range listed, please consider follow up with your Primary Care Provider.  If you are age 84 or younger, your body mass index should be between 19-25. Your Body mass index is 23.98 kg/m. If this is out of the aformentioned range listed, please consider follow up with your Primary Care Provider.   ________________________________________________________  The [facility] providers would like to encourage you to use MYCHART to communicate with providers for non-urgent requests or questions.  Due to long hold times on the telephone, sending your provider a message by [facility] may be a faster and more efficient way to get a response.  Please allow 48 business hours for a response.  Please remember that this is for non-urgent requests.  _______________________________________________________  Thank you for entrusting me with your care and for choosing [facility], Dr. Estefana Kidney

## 2023-02-15 ENCOUNTER — Other Ambulatory Visit: Payer: Self-pay | Admitting: Internal Medicine

## 2023-02-19 ENCOUNTER — Ambulatory Visit: Payer: BC Managed Care – PPO | Admitting: Internal Medicine

## 2023-02-19 ENCOUNTER — Encounter: Payer: Self-pay | Admitting: Internal Medicine

## 2023-02-19 ENCOUNTER — Other Ambulatory Visit: Payer: BC Managed Care – PPO

## 2023-02-19 VITALS — BP 118/80 | HR 96 | Temp 98.1°F | Ht 61.5 in | Wt 128.0 lb

## 2023-02-19 DIAGNOSIS — D582 Other hemoglobinopathies: Secondary | ICD-10-CM | POA: Diagnosis not present

## 2023-02-19 DIAGNOSIS — K219 Gastro-esophageal reflux disease without esophagitis: Secondary | ICD-10-CM

## 2023-02-19 DIAGNOSIS — E781 Pure hyperglyceridemia: Secondary | ICD-10-CM | POA: Diagnosis not present

## 2023-02-19 DIAGNOSIS — Z0000 Encounter for general adult medical examination without abnormal findings: Secondary | ICD-10-CM

## 2023-02-19 DIAGNOSIS — M0609 Rheumatoid arthritis without rheumatoid factor, multiple sites: Secondary | ICD-10-CM | POA: Diagnosis not present

## 2023-02-19 DIAGNOSIS — Z23 Encounter for immunization: Secondary | ICD-10-CM | POA: Diagnosis not present

## 2023-02-19 DIAGNOSIS — J4530 Mild persistent asthma, uncomplicated: Secondary | ICD-10-CM

## 2023-02-19 LAB — LIPID PANEL
Cholesterol: 169 mg/dL (ref 0–200)
HDL: 87.2 mg/dL (ref >39.00)
LDL Cholesterol: 46 mg/dL (ref 0–99)
NonHDL: 81.88
Total CHOL/HDL Ratio: 2
Triglycerides: 180 mg/dL — ABNORMAL HIGH (ref 0.0–149.0)
VLDL: 36 mg/dL (ref 0.0–40.0)

## 2023-02-19 MED ORDER — ACYCLOVIR 400 MG PO TABS: 400.0000 mg | ORAL_TABLET | Freq: Two times a day (BID) | ORAL | 3 refills | Status: DC

## 2023-02-19 MED ORDER — MONTELUKAST SODIUM 10 MG PO TABS: 10.0000 mg | ORAL_TABLET | Freq: Every day | ORAL | 3 refills | Status: DC

## 2023-02-19 MED ORDER — FENOFIBRATE 48 MG PO TABS: 48.0000 mg | ORAL_TABLET | Freq: Every day | ORAL | 3 refills | Status: DC

## 2023-02-19 NOTE — Assessment & Plan Note (Signed)
Needs HgA1c today for screening. Is controlled on humira.

## 2023-02-19 NOTE — Assessment & Plan Note (Signed)
Checking lipid panel and adjust fenofibrate 48 mg daily.

## 2023-02-19 NOTE — Assessment & Plan Note (Signed)
Taking pepcid 40 mg daily and seeing GI and on prevacid.

## 2023-02-19 NOTE — Assessment & Plan Note (Signed)
No flare and using breztri. Has albuterol if needed. Taking singulair which we refilled.

## 2023-02-19 NOTE — Assessment & Plan Note (Signed)
Flu shot completed. Pneumonia 20 given. Shingrix complete. Tetanus due declines today. Colon screening up to date. Mammogram up to date, pap smear up to date. Counseled about sun safety and mole surveillance. Counseled about the dangers of distracted driving. Given 10 year screening recommendations.

## 2023-02-19 NOTE — Progress Notes (Signed)
   Subjective:   Patient ID: Anna Torres, female    DOB: [birth year], 53 y.o.   MRN: [id number]  HPI The patient is here for physical.  PMH, [facility], social history reviewed and updated  Review of Systems  Constitutional: Negative.   HENT: Negative.    Eyes: Negative.   Respiratory:  Negative for cough, chest tightness and shortness of breath.   Cardiovascular:  Negative for chest pain, palpitations and leg swelling.  Gastrointestinal:  Negative for abdominal distention, abdominal pain, constipation, diarrhea, nausea and vomiting.  Musculoskeletal: Negative.   Skin: Negative.   Neurological: Negative.   Psychiatric/Behavioral: Negative.      Objective:  Physical Exam Constitutional:      Appearance: She is well-developed.  HENT:     Head: Normocephalic and atraumatic.  Cardiovascular:     Rate and Rhythm: Normal rate and regular rhythm.  Pulmonary:     Effort: Pulmonary effort is normal. No respiratory distress.     Breath sounds: Normal breath sounds. No wheezing or rales.  Abdominal:     General: Bowel sounds are normal. There is no distension.     Palpations: Abdomen is soft.     Tenderness: There is no abdominal tenderness. There is no rebound.  Musculoskeletal:     Cervical back: Normal range of motion.  Skin:    General: Skin is warm and dry.  Neurological:     Mental Status: She is alert and oriented to person, place, and time.     Coordination: Coordination normal.     Vitals:   02/19/23 0839  BP: 118/80  Pulse: 96  Temp: 98.1 F (36.7 C)  TempSrc: Oral  SpO2: 96%  Weight: 128 lb (58.1 kg)  Height: 5' 1.5" (1.562 m)    Assessment & Plan:  Prevnar 20 given at visit

## 2023-02-20 LAB — HEMOGLOBIN A1C
Hgb A1c MFr Bld: 5.5 %{Hb} (ref <5.7)
Mean Plasma Glucose: 111 mg/dL
eAG (mmol/L): 6.2 mmol/L

## 2023-03-21 ENCOUNTER — Other Ambulatory Visit: Payer: Self-pay | Admitting: Allergy and Immunology

## 2023-03-24 ENCOUNTER — Other Ambulatory Visit: Payer: Self-pay | Admitting: Internal Medicine

## 2023-03-24 NOTE — Telephone Encounter (Signed)
 Copied from CRM [id number]. Topic: Clinical - Medication Refill >> Mar 24, 2023  3:40 PM Alvino Blood C wrote: Most Recent Primary Care Visit:  Provider: Hillard Danker A  Department: [facility]  Visit Type: PHYSICAL  Date: 02/19/2023  Medication: Budeson-Glycopyrrol-Formoterol (BREZTRI AEROSPHERE) 160-9-4.8 MCG/ACT AERO  Has the patient contacted their pharmacy? Yes (Agent: If no, request that the patient contact the pharmacy for the refill. If patient does not wish to contact the pharmacy document the reason why and proceed with request.) (Agent: If yes, when and what did the pharmacy advise?)  Is this the correct pharmacy for this prescription? Yes If no, delete pharmacy and type the correct one.  This is the patient's preferred pharmacy:  [facility] [id number] - [place], [place] - [address] [place] [address] Phone: [phone] Fax: [phone]   Has the prescription been filled recently? Yes  Is the patient out of the medication? No  Has the patient been seen for an appointment in the last year OR does the patient have an upcoming appointment? Yes  Can we respond through MyChart? Yes  Agent: Please be advised that Rx refills may take up to 3 business days. We ask that you follow-up with your pharmacy.

## 2023-03-26 MED ORDER — BREZTRI AEROSPHERE 160-9-4.8 MCG/ACT IN AERO: 2.0000 | INHALATION_SPRAY | Freq: Two times a day (BID) | RESPIRATORY_TRACT | 5 refills | Status: AC

## 2023-04-27 ENCOUNTER — Other Ambulatory Visit: Payer: Self-pay | Admitting: Nurse Practitioner

## 2023-04-27 DIAGNOSIS — Z3041 Encounter for surveillance of contraceptive pills: Secondary | ICD-10-CM

## 2023-04-28 NOTE — Telephone Encounter (Signed)
 Med refill request: Sprintec Last AEX: 05/02/22 Next AEX: 05/05/23 Last MMG (if hormonal med) 07/01/22 Refill authorized: Please Advise, #[id number], 0 RF

## 2023-05-01 DIAGNOSIS — M79642 Pain in left hand: Secondary | ICD-10-CM | POA: Diagnosis not present

## 2023-05-01 DIAGNOSIS — M0609 Rheumatoid arthritis without rheumatoid factor, multiple sites: Secondary | ICD-10-CM | POA: Diagnosis not present

## 2023-05-01 DIAGNOSIS — M1990 Unspecified osteoarthritis, unspecified site: Secondary | ICD-10-CM | POA: Diagnosis not present

## 2023-05-01 DIAGNOSIS — R768 Other specified abnormal immunological findings in serum: Secondary | ICD-10-CM | POA: Diagnosis not present

## 2023-05-01 DIAGNOSIS — Z79899 Other long term (current) drug therapy: Secondary | ICD-10-CM | POA: Diagnosis not present

## 2023-05-01 DIAGNOSIS — M79671 Pain in right foot: Secondary | ICD-10-CM | POA: Diagnosis not present

## 2023-05-01 DIAGNOSIS — M79672 Pain in left foot: Secondary | ICD-10-CM | POA: Diagnosis not present

## 2023-05-01 DIAGNOSIS — M79641 Pain in right hand: Secondary | ICD-10-CM | POA: Diagnosis not present

## 2023-05-01 LAB — CBC AND DIFFERENTIAL
HCT: 40 (ref 36–46)
Hemoglobin: 13.1 (ref 12.0–16.0)
Platelets: 434 10*3/uL — AB (ref 150–400)
WBC: 9.1

## 2023-05-01 LAB — COMPREHENSIVE METABOLIC PANEL WITH GFR
Albumin: 4 (ref 3.5–5.0)
Calcium: 9.6 (ref 8.7–10.7)
Globulin: 3.3
eGFR: 70

## 2023-05-01 LAB — BASIC METABOLIC PANEL WITH GFR
BUN: 12 (ref 4–21)
CO2: 22 (ref 13–22)
Chloride: 105 (ref 99–108)
Creatinine: 1 (ref 0.5–1.1)
Potassium: 4.5 meq/L (ref 3.5–5.1)

## 2023-05-01 LAB — HEPATIC FUNCTION PANEL
ALT: 15 U/L (ref 7–35)
AST: 17 (ref 13–35)
Alkaline Phosphatase: 56 (ref 25–125)
Bilirubin, Total: 0.4

## 2023-05-01 LAB — CBC: RBC: 4.85 (ref 3.87–5.11)

## 2023-05-02 LAB — COMPREHENSIVE METABOLIC PANEL WITH GFR: Calcium: 10.1 (ref 8.7–10.7)

## 2023-05-05 ENCOUNTER — Encounter: Payer: Self-pay | Admitting: Nurse Practitioner

## 2023-05-05 ENCOUNTER — Other Ambulatory Visit (HOSPITAL_COMMUNITY)
Admission: RE | Admit: 2023-05-05 | Discharge: 2023-05-05 | Disposition: A | Discharge status: Home / Self Care | Source: Ambulatory Visit | Attending: Nurse Practitioner | Admitting: Nurse Practitioner

## 2023-05-05 ENCOUNTER — Ambulatory Visit: Payer: 59 | Admitting: Nurse Practitioner

## 2023-05-05 VITALS — BP 108/82 | HR 99 | Ht 61.5 in | Wt 128.0 lb

## 2023-05-05 DIAGNOSIS — Z124 Encounter for screening for malignant neoplasm of cervix: Secondary | ICD-10-CM

## 2023-05-05 DIAGNOSIS — Z3041 Encounter for surveillance of contraceptive pills: Secondary | ICD-10-CM

## 2023-05-05 DIAGNOSIS — Z01419 Encounter for gynecological examination (general) (routine) without abnormal findings: Secondary | ICD-10-CM | POA: Diagnosis not present

## 2023-05-05 DIAGNOSIS — Z1331 Encounter for screening for depression: Secondary | ICD-10-CM

## 2023-05-05 MED ORDER — NORGESTIMATE-ETH ESTRADIOL 0.25-35 MG-MCG PO TABS: ORAL_TABLET | ORAL | 3 refills | Status: AC

## 2023-05-05 NOTE — Progress Notes (Signed)
 Taylie Helder Clayburn 1970-08-11 [id number]   History:  53 y.o. G0 presents for annual exam. OCPs continuously with withdrawal bleed every 3 months. History of CIN-1 years ago. RA, on methotrexate.   Gynecologic History No LMP recorded (lmp unknown). (Menstrual status: Oral contraceptives). Period Cycle (Days):  (Every 3 months) Period Duration (Days): 5-7 Period Pattern: (!) Irregular Menstrual Flow: Moderate Menstrual Control: Maxi pad Dysmenorrhea: (!) Moderate Dysmenorrhea Symptoms: Cramping Contraception/Family planning: OCP (estrogen/progesterone) Sexually active: Yes  Health Maintenance Last Pap: 04/02/2018. Results were: Normal neg HPV Last mammogram: 07/01/2022. Results were: Normal Last colonoscopy: Never. Negative Cologuard 11/2021 Last Dexa: Not indicated  Flowsheet Row Office Visit from 05/05/2023 in [facility]  PHQ-2 Total Score 0        Past medical history, past surgical history, family history and social history were all reviewed and documented in the EPIC chart. Married. Works for [employer].   ROS:  A ROS was performed and pertinent positives and negatives are included.  Exam:  Vitals:   05/05/23 0755  BP: 108/82  Pulse: 99  SpO2: 98%  Weight: 128 lb (58.1 kg)  Height: 5' 1.5" (1.562 m)      Body mass index is 23.79 kg/m.  General appearance:  Normal Thyroid:  Symmetrical, normal in size, without palpable masses or nodularity. Respiratory  Auscultation:  Clear without wheezing or rhonchi Cardiovascular  Auscultation:  Regular rate, without rubs, murmurs or gallops  Edema/varicosities:  Not grossly evident Abdominal  Soft,nontender, without masses, guarding or rebound.  Liver/spleen:  No organomegaly noted  Hernia:  None appreciated  Skin  Inspection:  Grossly normal   Breasts: Examined lying and sitting.   Right: Without masses, retractions, discharge or axillary adenopathy.   Left: Without masses,  retractions, discharge or axillary adenopathy. Pelvic: External genitalia:  no lesions              Urethra:  normal appearing urethra with no masses, tenderness or lesions              Bartholins and Skenes: normal                 Vagina: normal appearing vagina with normal color and discharge, no lesions              Cervix: no lesions Bimanual Exam:  Uterus:  no masses or tenderness              Adnexa: no mass, fullness, tenderness              Rectovaginal: Deferred              Anus:  normal, no lesions  Patient informed chaperone available to be present for breast and pelvic exam. Patient has requested no chaperone to be present. Patient has been advised what will be completed during breast and pelvic exam.   Assessment/Plan:  53 y.o. G0 for annual exam.   Well female exam with routine gynecological exam - Education provided on SBEs, importance of preventative screenings, current guidelines, high calcium diet, regular exercise, and multivitamin daily. Labs with PCP.   Encounter for surveillance of contraceptive pills - Plan: norgestimate-ethinyl estradiol (SPRINTEC 28) 0.25-35 MG-MCG tablet daily. Taking as prescribed. Refill x 1 year provided.   Cervical cancer screening - Plan: Cytology - PAP( [place]). CIN 1 years ago. Pap today per guidelines.   Screening for breast cancer - Normal mammogram history. Continue annual screenings. Normal breast exam today.  Screening for colon cancer - Negative Cologuard 11/2021. Will repeat at 3-year interval per recommendation.   Return in about 1 year (around 05/04/2024) for Annual.    Andee Bamberger [facility], 8:17 AM 05/05/2023

## 2023-05-08 ENCOUNTER — Encounter: Payer: Self-pay | Admitting: Nurse Practitioner

## 2023-05-08 LAB — CYTOLOGY - PAP
Comment: NEGATIVE
Diagnosis: NEGATIVE
High risk HPV: NEGATIVE

## 2023-05-12 ENCOUNTER — Encounter: Payer: Self-pay | Admitting: Internal Medicine

## 2023-05-15 ENCOUNTER — Other Ambulatory Visit: Payer: Self-pay | Admitting: Cardiology

## 2023-05-15 DIAGNOSIS — E781 Pure hyperglyceridemia: Secondary | ICD-10-CM

## 2023-05-22 ENCOUNTER — Other Ambulatory Visit: Payer: Self-pay

## 2023-05-22 DIAGNOSIS — E781 Pure hyperglyceridemia: Secondary | ICD-10-CM

## 2023-05-22 MED ORDER — ROSUVASTATIN CALCIUM 20 MG PO TABS: 20.0000 mg | ORAL_TABLET | Freq: Every day | ORAL | 0 refills | Status: DC

## 2023-06-08 ENCOUNTER — Encounter: Payer: Self-pay | Admitting: Nurse Practitioner

## 2023-06-17 ENCOUNTER — Encounter: Payer: Self-pay | Admitting: Internal Medicine

## 2023-06-19 ENCOUNTER — Other Ambulatory Visit: Payer: Self-pay

## 2023-06-19 DIAGNOSIS — E781 Pure hyperglyceridemia: Secondary | ICD-10-CM

## 2023-06-19 MED ORDER — ROSUVASTATIN CALCIUM 20 MG PO TABS: 20.0000 mg | ORAL_TABLET | Freq: Every day | ORAL | 0 refills | Status: DC

## 2023-07-03 ENCOUNTER — Other Ambulatory Visit: Payer: Self-pay | Admitting: Cardiology

## 2023-07-03 DIAGNOSIS — E781 Pure hyperglyceridemia: Secondary | ICD-10-CM

## 2023-07-09 NOTE — Patient Instructions (Addendum)
        See your chiropractor and let me know.     Return if symptoms worsen or fail to improve.

## 2023-07-09 NOTE — Progress Notes (Unsigned)
 Subjective:    Patient ID: Anna Torres, female    DOB: [birth year], 53 y.o.   MRN: [id number]      HPI Anna Torres is here for No chief complaint on file.   She is here for an acute visit for cold symptoms.  She has asthma.  Take Breztri  daily.  She has RA and is on Humira.  Her symptoms started   She is experiencing   She has tried taking       Medications and allergies reviewed with patient and updated if appropriate.  Current Outpatient Medications on File Prior to Visit  Medication Sig Dispense Refill   acyclovir  (ZOVIRAX ) 400 MG tablet Take 1 tablet (400 mg total) by mouth 2 (two) times daily. 180 tablet 3   Baclofen  5 MG TABS Take 1 tablet (5 mg total) by mouth 2 (two) times daily with a meal. 60 tablet 2   Budeson-Glycopyrrol-Formoterol  (BREZTRI  AEROSPHERE) 160-9-4.8 MCG/ACT AERO Inhale 2 puffs into the lungs in the morning and at bedtime. 11 g 5   famotidine  (PEPCID ) 40 MG tablet Take 1 tablet (40 mg total) by mouth at bedtime. 90 tablet 1   fenofibrate  (TRICOR ) 48 MG tablet Take 1 tablet (48 mg total) by mouth daily. 90 tablet 3   Homeopathic Products (ZICAM ALLERGY RELIEF NA) Place 1 spray into the nose as needed.     HUMIRA PEN 40 MG/0.4ML PNKT SMARTSIG:40 Milligram(s) SUB-Q Every 2 Weeks     lansoprazole  (PREVACID ) 30 MG capsule Take 1 capsule (30 mg total) by mouth in the morning. 90 capsule 1   montelukast  (SINGULAIR ) 10 MG tablet Take 1 tablet (10 mg total) by mouth at bedtime. 90 tablet 3   norgestimate -ethinyl estradiol  (SPRINTEC 28) 0.25-35 MG-MCG tablet TAKE 1 TABLET BY MOUTH DAILY SKIPPING PLACEBOS 112 tablet 3   rosuvastatin  (CRESTOR ) 20 MG tablet Take 1 tablet (20 mg total) by mouth daily. 15 tablet 0   Spacer/Aero-Holding Chambers DEVI 1 Device by Does not apply route as directed. (Patient not taking: Reported on 05/05/2023) 1 each 1   sucralfate  (CARAFATE ) 1 GM/10ML suspension Take 10 ml 30 minutes before breakfast and 30 minutes before dinner  (twice daily dosing). (Patient not taking: Reported on 05/05/2023) 600 mL 0   No current facility-administered medications on file prior to visit.    Review of Systems     Objective:  There were no vitals filed for this visit. BP Readings from Last 3 Encounters:  05/05/23 108/82  02/19/23 118/80  01/27/23 100/80   Wt Readings from Last 3 Encounters:  05/05/23 128 lb (58.1 kg)  02/19/23 128 lb (58.1 kg)  01/27/23 129 lb (58.5 kg)   There is no height or weight on file to calculate BMI.    Physical Exam Constitutional:      General: She is not in acute distress.    Appearance: Normal appearance. She is not ill-appearing.  HENT:     Head: Normocephalic and atraumatic.     Right Ear: Tympanic membrane, ear canal and external ear normal.     Left Ear: Tympanic membrane, ear canal and external ear normal.     Mouth/Throat:     Mouth: Mucous membranes are moist.     Pharynx: No oropharyngeal exudate or posterior oropharyngeal erythema.   Eyes:     Conjunctiva/sclera: Conjunctivae normal.    Cardiovascular:     Rate and Rhythm: Normal rate and regular rhythm.  Pulmonary:     Effort: Pulmonary effort is  normal. No respiratory distress.     Breath sounds: Normal breath sounds. No wheezing or rales.   Musculoskeletal:     Cervical back: Neck supple. No tenderness.  Lymphadenopathy:     Cervical: No cervical adenopathy.   Skin:    General: Skin is warm and dry.   Neurological:     Mental Status: She is alert.            Assessment & Plan:    See Problem List for Assessment and Plan of chronic medical problems.

## 2023-07-10 ENCOUNTER — Ambulatory Visit (INDEPENDENT_AMBULATORY_CARE_PROVIDER_SITE_OTHER)

## 2023-07-10 ENCOUNTER — Ambulatory Visit: Payer: Self-pay | Admitting: Internal Medicine

## 2023-07-10 ENCOUNTER — Ambulatory Visit: Admitting: Internal Medicine

## 2023-07-10 ENCOUNTER — Encounter: Payer: Self-pay | Admitting: Internal Medicine

## 2023-07-10 VITALS — BP 106/78 | HR 64 | Temp 98.4°F | Ht 61.5 in | Wt 127.0 lb

## 2023-07-10 DIAGNOSIS — R051 Acute cough: Secondary | ICD-10-CM

## 2023-07-10 DIAGNOSIS — J069 Acute upper respiratory infection, unspecified: Secondary | ICD-10-CM | POA: Diagnosis not present

## 2023-07-10 MED ORDER — FLUTICASONE PROPIONATE 50 MCG/ACT NA SUSP: 2.0000 | Freq: Every day | NASAL | 6 refills | Status: AC

## 2023-07-20 ENCOUNTER — Other Ambulatory Visit: Payer: Self-pay | Admitting: Internal Medicine

## 2023-07-22 ENCOUNTER — Other Ambulatory Visit: Payer: Self-pay | Admitting: Internal Medicine

## 2023-08-03 ENCOUNTER — Other Ambulatory Visit: Payer: Self-pay | Admitting: Internal Medicine

## 2023-08-12 ENCOUNTER — Other Ambulatory Visit: Payer: Self-pay | Admitting: Cardiology

## 2023-08-12 DIAGNOSIS — E781 Pure hyperglyceridemia: Secondary | ICD-10-CM

## 2023-08-24 ENCOUNTER — Other Ambulatory Visit: Payer: Self-pay | Admitting: Cardiology

## 2023-08-24 DIAGNOSIS — E781 Pure hyperglyceridemia: Secondary | ICD-10-CM

## 2023-08-26 ENCOUNTER — Other Ambulatory Visit: Payer: Self-pay

## 2023-08-26 ENCOUNTER — Encounter: Payer: Self-pay | Admitting: Internal Medicine

## 2023-08-26 MED ORDER — MONTELUKAST SODIUM 10 MG PO TABS: 10.0000 mg | ORAL_TABLET | Freq: Every day | ORAL | 3 refills | Status: AC

## 2023-10-07 ENCOUNTER — Other Ambulatory Visit: Payer: Self-pay | Admitting: Internal Medicine

## 2023-10-19 ENCOUNTER — Encounter: Payer: Self-pay | Admitting: Nurse Practitioner

## 2023-11-02 ENCOUNTER — Encounter: Payer: Self-pay | Admitting: Internal Medicine

## 2023-11-03 ENCOUNTER — Other Ambulatory Visit: Payer: Self-pay

## 2023-11-03 DIAGNOSIS — K219 Gastro-esophageal reflux disease without esophagitis: Secondary | ICD-10-CM

## 2023-11-03 MED ORDER — BACLOFEN 5 MG PO TABS: 5.0000 mg | ORAL_TABLET | Freq: Two times a day (BID) | ORAL | 0 refills | Status: DC

## 2023-11-13 ENCOUNTER — Other Ambulatory Visit: Payer: Self-pay | Admitting: Medical Genetics

## 2023-11-13 DIAGNOSIS — Z006 Encounter for examination for normal comparison and control in clinical research program: Secondary | ICD-10-CM

## 2023-11-23 ENCOUNTER — Encounter: Payer: Self-pay | Admitting: Internal Medicine

## 2023-12-19 LAB — GENECONNECT MOLECULAR SCREEN: Genetic Analysis Overall Interpretation: NEGATIVE

## 2023-12-28 ENCOUNTER — Other Ambulatory Visit: Payer: Self-pay | Admitting: Physician Assistant

## 2023-12-28 DIAGNOSIS — K219 Gastro-esophageal reflux disease without esophagitis: Secondary | ICD-10-CM

## 2023-12-30 ENCOUNTER — Other Ambulatory Visit: Payer: Self-pay | Admitting: Physician Assistant

## 2023-12-30 DIAGNOSIS — K219 Gastro-esophageal reflux disease without esophagitis: Secondary | ICD-10-CM

## 2024-01-08 ENCOUNTER — Ambulatory Visit: Admitting: Internal Medicine

## 2024-01-13 ENCOUNTER — Encounter: Payer: Self-pay | Admitting: Family Medicine

## 2024-01-13 ENCOUNTER — Telehealth (INDEPENDENT_AMBULATORY_CARE_PROVIDER_SITE_OTHER): Admitting: Family Medicine

## 2024-01-13 ENCOUNTER — Ambulatory Visit: Payer: Self-pay | Admitting: Family Medicine

## 2024-01-13 ENCOUNTER — Ambulatory Visit

## 2024-01-13 ENCOUNTER — Telehealth: Payer: Self-pay

## 2024-01-13 VITALS — BP 94/72 | HR 83 | Temp 98.8°F | Resp 18 | Ht 61.0 in | Wt 127.0 lb

## 2024-01-13 DIAGNOSIS — R052 Subacute cough: Secondary | ICD-10-CM

## 2024-01-13 DIAGNOSIS — R062 Wheezing: Secondary | ICD-10-CM | POA: Diagnosis not present

## 2024-01-13 DIAGNOSIS — D84821 Immunodeficiency due to drugs: Secondary | ICD-10-CM

## 2024-01-13 DIAGNOSIS — J208 Acute bronchitis due to other specified organisms: Secondary | ICD-10-CM

## 2024-01-13 DIAGNOSIS — M0609 Rheumatoid arthritis without rheumatoid factor, multiple sites: Secondary | ICD-10-CM | POA: Diagnosis not present

## 2024-01-13 DIAGNOSIS — J101 Influenza due to other identified influenza virus with other respiratory manifestations: Secondary | ICD-10-CM

## 2024-01-13 DIAGNOSIS — J4541 Moderate persistent asthma with (acute) exacerbation: Secondary | ICD-10-CM

## 2024-01-13 MED ORDER — AZITHROMYCIN 250 MG PO TABS: ORAL_TABLET | ORAL | 0 refills | Status: AC

## 2024-01-13 MED ORDER — PROMETHAZINE HCL 6.25 MG/5ML PO SOLN: 6.2500 mg | Freq: Four times a day (QID) | ORAL | 0 refills | Status: AC | PRN

## 2024-01-13 NOTE — Patient Instructions (Addendum)
 We are getting an xray today. We will be in contact with any abnormal results that require further attention.  I have sent in hydrocodone cough syrup for you to take 5 mL once daily in the evening as needed for cough.  This medication may make you sleepy.  Do not drive or operate heavy machinery while taking this medication.  You have received a steroid injection in the office today.

## 2024-01-13 NOTE — Telephone Encounter (Signed)
 Copied from CRM #[id number]. Topic: Clinical - Lab/Test Results >> Jan 13, 2024 12:12 PM Jayma L wrote: Reason for CRM: patient called asking for a update on the xrays asking for a callback please at phone number [phone]

## 2024-01-13 NOTE — Progress Notes (Signed)
 "  Acute Office Visit  Subjective:     Patient ID: Anna Torres, female    DOB: 07-Jul-1970, 53 y.o.   MRN: [id number]  Chief Complaint  Patient presents with   Acute Visit    Cough, runny nose. Sore throat for 1 week    HPI  Discussed the use of AI scribe software for clinical note transcription with the patient, who gave verbal consent to proceed.  History of Present Illness Anna Torres is a 53 year old female with rheumatoid arthritis and asthma who presents with persistent flu symptoms.  Upper respiratory symptoms - Persistent flu-like symptoms since last Tuesday - Fever developed last Tuesday, now resolved - Ongoing nasal congestion and post-nasal drip - Uses Flonase  as needed for nasal symptoms  Cough and chest discomfort - Chest pressure during coughing fits, described as 'an elephant is sitting around me' - Avoids cough syrups due to prior lack of benefit  Asthma symptoms - Asthma with chest tightness and wheezing - Uses a steroid inhaler for asthma management  Rheumatoid arthritis management - Rheumatoid arthritis treated with Hyramose (generic Humira)  Medication allergies - Allergic to codeine and sumatriptan   Infectious disease concerns - Concerned about pneumonia due to current respiratory symptoms, immunocompromise     ROS Per HPI      Objective:    BP 94/72 (BP Location: Left Arm, Patient Position: Sitting, Cuff Size: Normal)   Pulse 83   Temp 98.8 F (37.1 C) (Temporal)   Resp 18   Ht 5' 1 (1.549 m)   Wt 127 lb (57.6 kg)   SpO2 100%   BMI 24.00 kg/m    Physical Exam Vitals and nursing note reviewed.  Constitutional:      General: She is not in acute distress.    Appearance: She is normal weight.     Comments: Appears fatigued  HENT:     Head: Normocephalic and atraumatic.     Right Ear: External ear normal.     Left Ear: External ear normal.     Nose: Nose normal.     Mouth/Throat:     Mouth: Mucous  membranes are moist.     Pharynx: Oropharynx is clear.  Eyes:     Extraocular Movements: Extraocular movements intact.     Pupils: Pupils are equal, round, and reactive to light.  Cardiovascular:     Rate and Rhythm: Normal rate and regular rhythm.     Pulses: Normal pulses.     Heart sounds: Normal heart sounds.  Pulmonary:     Effort: Pulmonary effort is normal. No respiratory distress.     Breath sounds: Examination of the right-middle field reveals wheezing. Examination of the right-lower field reveals decreased breath sounds. Decreased breath sounds and wheezing present. No rhonchi or rales.  Musculoskeletal:        General: Normal range of motion.     Cervical back: Normal range of motion.     Right lower leg: No edema.     Left lower leg: No edema.  Lymphadenopathy:     Cervical: Cervical adenopathy present.  Neurological:     General: No focal deficit present.     Mental Status: She is alert and oriented to person, place, and time.  Psychiatric:        Mood and Affect: Mood normal.        Thought Content: Thought content normal.     No results found for any visits on 01/13/24.  Assessment & Plan:   Assessment and Plan Assessment & Plan Influenza A with persistent cough and wheezing Confirmed Influenza A with persistent cough and wheezing. Concern for pneumonia due to immunocompromised status from rheumatoid arthritis treatment. - Ordered chest x-ray to evaluate for pneumonia. - Administered steroid shot to reduce inflammation and open airways. - Prescribed cough syrup without codeine. - Advised against returning to work until Friday. - If no obvious pneumonia, will send azithromycin  to treat likely bacterial secondary URI  Mod, persistent asthma with acute exacerbation Exacerbated by influenza infection, contributing to wheezing and difficulty breathing. - Administered steroid shot to manage asthma exacerbation.  Rheumatoid arthritis , immunocompromised due  to drug therapy Managed with Hyrimoz, increasing infection risk due to immunosuppression. - Continue Hyrimoz for rheumatoid arthritis management. - Discussed likely longer course of illness and higher risk for secondary  bacterial infection given immunosuppression     Orders Placed This Encounter  Procedures   DG Chest 2 View    Standing Status:   Future    Number of Occurrences:   1    Expiration Date:   07/13/2024    Reason for Exam (SYMPTOM  OR DIAGNOSIS REQUIRED):   cough, SOB, immunosuppressed    Preferred imaging location?:   [facility] ordered this encounter  Medications   promethazine  (PHENERGAN ) 6.25 MG/5ML solution    Sig: Take 5 mLs (6.25 mg total) by mouth every 6 (six) hours as needed for nausea or vomiting.    Dispense:  120 mL    Refill:  0    Return if symptoms worsen or fail to improve.  Corean LITTIE Ku, FNP  "

## 2024-01-13 NOTE — Telephone Encounter (Signed)
 Spoke with patient, Phenergan  was sent in for N/V. Patient states she was not nauseous, when she mentioned her stomach she wanted to clarify she just had a big bowel movement. She is asking for something for her cough

## 2024-02-01 ENCOUNTER — Other Ambulatory Visit: Payer: Self-pay | Admitting: Physician Assistant

## 2024-02-01 DIAGNOSIS — K219 Gastro-esophageal reflux disease without esophagitis: Secondary | ICD-10-CM

## 2024-02-12 ENCOUNTER — Other Ambulatory Visit: Payer: Self-pay | Admitting: Internal Medicine

## 2024-02-20 ENCOUNTER — Encounter: Payer: BC Managed Care – PPO | Admitting: Internal Medicine

## 2024-03-03 ENCOUNTER — Encounter: Admitting: Internal Medicine

## 2024-05-05 ENCOUNTER — Ambulatory Visit: Admitting: Nurse Practitioner
